# Patient Record
Sex: Male | Born: 1945 | Race: Black or African American | Hispanic: No | Marital: Married | Smoking: Never smoker
Health system: Southern US, Community
[De-identification: ages and names within clinical notes are randomized; demographics above are authoritative.]

## PROBLEM LIST (undated history)

## (undated) DIAGNOSIS — G4730 Sleep apnea, unspecified: Secondary | ICD-10-CM

## (undated) DIAGNOSIS — S8010XA Contusion of unspecified lower leg, initial encounter: Secondary | ICD-10-CM

## (undated) DIAGNOSIS — E785 Hyperlipidemia, unspecified: Secondary | ICD-10-CM

## (undated) DIAGNOSIS — Z9889 Other specified postprocedural states: Secondary | ICD-10-CM

## (undated) DIAGNOSIS — Z8719 Personal history of other diseases of the digestive system: Secondary | ICD-10-CM

## (undated) DIAGNOSIS — I219 Acute myocardial infarction, unspecified: Secondary | ICD-10-CM

## (undated) DIAGNOSIS — T7840XA Allergy, unspecified, initial encounter: Secondary | ICD-10-CM

## (undated) DIAGNOSIS — I2510 Atherosclerotic heart disease of native coronary artery without angina pectoris: Secondary | ICD-10-CM

## (undated) HISTORY — PX: CARDIAC CATHETERIZATION: SHX172

## (undated) HISTORY — PX: INGUINAL HERNIA REPAIR: SUR1180

## (undated) HISTORY — DX: Other specified postprocedural states: Z98.890

## (undated) HISTORY — DX: Allergy, unspecified, initial encounter: T78.40XA

## (undated) HISTORY — DX: Personal history of other diseases of the digestive system: Z87.19

## (undated) HISTORY — DX: Hyperlipidemia, unspecified: E78.5

## (undated) HISTORY — DX: Contusion of unspecified lower leg, initial encounter: S80.10XA

---

## 1998-11-12 ENCOUNTER — Ambulatory Visit (HOSPITAL_COMMUNITY): Admission: RE | Admit: 1998-11-12 | Discharge: 1998-11-12 | Payer: Self-pay | Admitting: Internal Medicine

## 1998-11-12 ENCOUNTER — Encounter: Payer: Self-pay | Admitting: Internal Medicine

## 2001-09-23 LAB — HM COLONOSCOPY: HM Colonoscopy: NORMAL

## 2004-10-10 ENCOUNTER — Ambulatory Visit: Payer: Self-pay | Admitting: Internal Medicine

## 2004-11-14 ENCOUNTER — Ambulatory Visit (HOSPITAL_COMMUNITY): Admission: RE | Admit: 2004-11-14 | Discharge: 2004-11-14 | Payer: Self-pay | Admitting: Internal Medicine

## 2004-11-14 ENCOUNTER — Ambulatory Visit: Payer: Self-pay | Admitting: Internal Medicine

## 2005-03-21 ENCOUNTER — Ambulatory Visit: Payer: Self-pay | Admitting: Internal Medicine

## 2005-03-22 ENCOUNTER — Ambulatory Visit: Payer: Self-pay | Admitting: Internal Medicine

## 2005-03-22 ENCOUNTER — Ambulatory Visit: Payer: Self-pay | Admitting: Gastroenterology

## 2005-03-24 ENCOUNTER — Ambulatory Visit (HOSPITAL_COMMUNITY): Admission: RE | Admit: 2005-03-24 | Discharge: 2005-03-24 | Payer: Self-pay | Admitting: Gastroenterology

## 2005-05-01 ENCOUNTER — Ambulatory Visit: Payer: Self-pay | Admitting: Gastroenterology

## 2005-06-29 ENCOUNTER — Ambulatory Visit: Payer: Self-pay | Admitting: Gastroenterology

## 2005-07-10 ENCOUNTER — Ambulatory Visit: Payer: Self-pay | Admitting: Internal Medicine

## 2006-01-10 ENCOUNTER — Ambulatory Visit: Payer: Self-pay | Admitting: Internal Medicine

## 2006-07-26 ENCOUNTER — Ambulatory Visit: Payer: Self-pay | Admitting: Endocrinology

## 2007-01-31 ENCOUNTER — Encounter: Payer: Self-pay | Admitting: *Deleted

## 2007-01-31 DIAGNOSIS — E785 Hyperlipidemia, unspecified: Secondary | ICD-10-CM | POA: Insufficient documentation

## 2007-03-20 ENCOUNTER — Ambulatory Visit: Payer: Self-pay | Admitting: Internal Medicine

## 2007-03-20 LAB — CONVERTED CEMR LAB
ALT: 32 units/L (ref 0–53)
AST: 26 units/L (ref 0–37)
Albumin: 3.9 g/dL (ref 3.5–5.2)
Alkaline Phosphatase: 49 units/L (ref 39–117)
BUN: 14 mg/dL (ref 6–23)
Basophils Absolute: 0.1 10*3/uL (ref 0.0–0.1)
Basophils Relative: 0.9 % (ref 0.0–1.0)
Bilirubin Urine: NEGATIVE
Bilirubin, Direct: 0.1 mg/dL (ref 0.0–0.3)
CO2: 29 meq/L (ref 19–32)
Calcium: 9.2 mg/dL (ref 8.4–10.5)
Chloride: 104 meq/L (ref 96–112)
Cholesterol: 184 mg/dL (ref 0–200)
Creatinine, Ser: 1 mg/dL (ref 0.4–1.5)
Crystals: NEGATIVE
Eosinophils Absolute: 0.1 10*3/uL (ref 0.0–0.6)
Eosinophils Relative: 2.2 % (ref 0.0–5.0)
GFR calc Af Amer: 98 mL/min
GFR calc non Af Amer: 81 mL/min
Glucose, Bld: 105 mg/dL — ABNORMAL HIGH (ref 70–99)
HCT: 45.1 % (ref 39.0–52.0)
HDL: 53.7 mg/dL (ref >39.0)
Hemoglobin, Urine: NEGATIVE
Hemoglobin: 15.7 g/dL (ref 13.0–17.0)
Ketones, ur: NEGATIVE mg/dL
LDL Cholesterol: 117 mg/dL — ABNORMAL HIGH (ref 0–99)
Lymphocytes Relative: 30.3 % (ref 12.0–46.0)
MCHC: 34.9 g/dL (ref 30.0–36.0)
MCV: 95 fL (ref 78.0–100.0)
Monocytes Absolute: 0.6 10*3/uL (ref 0.2–0.7)
Monocytes Relative: 11.3 % — ABNORMAL HIGH (ref 3.0–11.0)
Mucus, UA: NEGATIVE
Neutro Abs: 3.1 10*3/uL (ref 1.4–7.7)
Neutrophils Relative %: 55.3 % (ref 43.0–77.0)
Nitrite: NEGATIVE
PSA: 1.11 ng/mL (ref 0.10–4.00)
Platelets: 230 10*3/uL (ref 150–400)
Potassium: 4.6 meq/L (ref 3.5–5.1)
RBC / HPF: NONE SEEN
RBC: 4.75 M/uL (ref 4.22–5.81)
RDW: 11.9 % (ref 11.5–14.6)
Sodium: 139 meq/L (ref 135–145)
Specific Gravity, Urine: 1.02 (ref 1.000–1.03)
TSH: 1.7 microintl units/mL (ref 0.35–5.50)
Total Bilirubin: 0.8 mg/dL (ref 0.3–1.2)
Total CHOL/HDL Ratio: 3.4
Total Protein, Urine: NEGATIVE mg/dL
Total Protein: 6.6 g/dL (ref 6.0–8.3)
Triglycerides: 67 mg/dL (ref 0–149)
Urine Glucose: NEGATIVE mg/dL
Urobilinogen, UA: 0.2 (ref 0.0–1.0)
VLDL: 13 mg/dL (ref 0–40)
WBC: 5.6 10*3/uL (ref 4.5–10.5)
pH: 6.5 (ref 5.0–8.0)

## 2007-04-16 ENCOUNTER — Ambulatory Visit: Payer: Self-pay | Admitting: Internal Medicine

## 2007-04-16 DIAGNOSIS — N529 Male erectile dysfunction, unspecified: Secondary | ICD-10-CM | POA: Insufficient documentation

## 2007-04-16 DIAGNOSIS — K219 Gastro-esophageal reflux disease without esophagitis: Secondary | ICD-10-CM | POA: Insufficient documentation

## 2007-06-04 ENCOUNTER — Ambulatory Visit: Payer: Self-pay | Admitting: Internal Medicine

## 2007-06-04 DIAGNOSIS — R5381 Other malaise: Secondary | ICD-10-CM

## 2007-06-04 DIAGNOSIS — R5383 Other fatigue: Secondary | ICD-10-CM

## 2007-06-04 LAB — CONVERTED CEMR LAB
Cholesterol: 109 mg/dL (ref 0–200)
HDL: 42.3 mg/dL (ref >39.0)
LDL Cholesterol: 58 mg/dL (ref 0–99)
Testosterone: 530.85 ng/dL (ref 350.00–890)
Total CHOL/HDL Ratio: 2.6
Triglycerides: 42 mg/dL (ref 0–149)
VLDL: 8 mg/dL (ref 0–40)
Vitamin B-12: 341 pg/mL (ref 211–911)

## 2007-06-07 LAB — CONVERTED CEMR LAB: Vit D, 1,25-Dihydroxy: 54 (ref 30–89)

## 2007-10-16 ENCOUNTER — Ambulatory Visit: Payer: Self-pay | Admitting: Internal Medicine

## 2007-11-29 ENCOUNTER — Telehealth: Payer: Self-pay | Admitting: Internal Medicine

## 2008-02-12 ENCOUNTER — Telehealth: Payer: Self-pay | Admitting: Internal Medicine

## 2008-04-08 ENCOUNTER — Telehealth: Payer: Self-pay | Admitting: Internal Medicine

## 2008-04-09 ENCOUNTER — Ambulatory Visit: Payer: Self-pay | Admitting: Internal Medicine

## 2008-04-09 LAB — CONVERTED CEMR LAB
ALT: 19 units/L (ref 0–53)
AST: 23 units/L (ref 0–37)
Albumin: 3.8 g/dL (ref 3.5–5.2)
Alkaline Phosphatase: 38 units/L — ABNORMAL LOW (ref 39–117)
BUN: 13 mg/dL (ref 6–23)
Basophils Absolute: 0 10*3/uL (ref 0.0–0.1)
Basophils Relative: 0.3 % (ref 0.0–3.0)
Bilirubin Urine: NEGATIVE
Bilirubin, Direct: 0.1 mg/dL (ref 0.0–0.3)
CO2: 30 meq/L (ref 19–32)
Calcium: 9.1 mg/dL (ref 8.4–10.5)
Chloride: 106 meq/L (ref 96–112)
Cholesterol: 168 mg/dL (ref 0–200)
Creatinine, Ser: 1 mg/dL (ref 0.4–1.5)
Eosinophils Absolute: 0.3 10*3/uL (ref 0.0–0.7)
Eosinophils Relative: 5.6 % — ABNORMAL HIGH (ref 0.0–5.0)
GFR calc Af Amer: 98 mL/min
GFR calc non Af Amer: 81 mL/min
Glucose, Bld: 104 mg/dL — ABNORMAL HIGH (ref 70–99)
HCT: 44.2 % (ref 39.0–52.0)
HDL: 56.1 mg/dL (ref >39.0)
Hemoglobin: 14.8 g/dL (ref 13.0–17.0)
Ketones, ur: NEGATIVE mg/dL
LDL Cholesterol: 99 mg/dL (ref 0–99)
Leukocytes, UA: NEGATIVE
Lymphocytes Relative: 33.8 % (ref 12.0–46.0)
MCHC: 33.6 g/dL (ref 30.0–36.0)
MCV: 97.9 fL (ref 78.0–100.0)
Monocytes Absolute: 0.6 10*3/uL (ref 0.1–1.0)
Monocytes Relative: 11.5 % (ref 3.0–12.0)
Neutro Abs: 2.3 10*3/uL (ref 1.4–7.7)
Neutrophils Relative %: 48.8 % (ref 43.0–77.0)
Nitrite: NEGATIVE
Platelets: 198 10*3/uL (ref 150–400)
Potassium: 4.1 meq/L (ref 3.5–5.1)
RBC: 4.51 M/uL (ref 4.22–5.81)
RDW: 12.1 % (ref 11.5–14.6)
Sodium: 141 meq/L (ref 135–145)
Specific Gravity, Urine: 1.025 (ref 1.000–1.03)
TSH: 2.07 microintl units/mL (ref 0.35–5.50)
Total Bilirubin: 0.8 mg/dL (ref 0.3–1.2)
Total CHOL/HDL Ratio: 3
Total Protein, Urine: NEGATIVE mg/dL
Total Protein: 6.2 g/dL (ref 6.0–8.3)
Triglycerides: 63 mg/dL (ref 0–149)
Urine Glucose: NEGATIVE mg/dL
Urobilinogen, UA: 0.2 (ref 0.0–1.0)
VLDL: 13 mg/dL (ref 0–40)
WBC: 4.9 10*3/uL (ref 4.5–10.5)
pH: 5.5 (ref 5.0–8.0)

## 2008-04-16 ENCOUNTER — Ambulatory Visit: Payer: Self-pay | Admitting: Internal Medicine

## 2008-04-24 HISTORY — PX: CATARACT EXTRACTION W/ INTRAOCULAR LENS  IMPLANT, BILATERAL: SHX1307

## 2008-11-23 ENCOUNTER — Ambulatory Visit: Payer: Self-pay | Admitting: Internal Medicine

## 2008-11-23 DIAGNOSIS — L272 Dermatitis due to ingested food: Secondary | ICD-10-CM | POA: Insufficient documentation

## 2008-11-23 DIAGNOSIS — G4700 Insomnia, unspecified: Secondary | ICD-10-CM

## 2008-11-26 ENCOUNTER — Ambulatory Visit: Payer: Self-pay | Admitting: Internal Medicine

## 2008-11-26 LAB — CONVERTED CEMR LAB
BUN: 16 mg/dL (ref 6–23)
CO2: 29 meq/L (ref 19–32)
Chloride: 105 meq/L (ref 96–112)
Creatinine, Ser: 1.2 mg/dL (ref 0.4–1.5)
Eosinophils Absolute: 0.1 10*3/uL (ref 0.0–0.7)
Glucose, Bld: 84 mg/dL (ref 70–99)
HCT: 43.3 % (ref 39.0–52.0)
Lymphs Abs: 1.5 10*3/uL (ref 0.7–4.0)
MCHC: 34.8 g/dL (ref 30.0–36.0)
MCV: 97.4 fL (ref 78.0–100.0)
Monocytes Absolute: 0.5 10*3/uL (ref 0.1–1.0)
Neutrophils Relative %: 52.7 % (ref 43.0–77.0)
Platelets: 211 10*3/uL (ref 150.0–400.0)
Potassium: 4.2 meq/L (ref 3.5–5.1)
RDW: 12.3 % (ref 11.5–14.6)

## 2008-11-30 LAB — CONVERTED CEMR LAB: IgE (Immunoglobulin E), Serum: 63.3 intl units/mL (ref 0.0–180.0)

## 2009-01-27 ENCOUNTER — Emergency Department (HOSPITAL_COMMUNITY): Admission: EM | Admit: 2009-01-27 | Discharge: 2009-01-27 | Payer: Self-pay | Admitting: Emergency Medicine

## 2009-01-27 ENCOUNTER — Telehealth: Payer: Self-pay | Admitting: Internal Medicine

## 2009-03-22 ENCOUNTER — Ambulatory Visit: Payer: Self-pay | Admitting: Internal Medicine

## 2009-03-22 LAB — CONVERTED CEMR LAB
ALT: 26 units/L (ref 0–53)
AST: 24 units/L (ref 0–37)
Albumin: 3.7 g/dL (ref 3.5–5.2)
Bilirubin Urine: NEGATIVE
CO2: 28 meq/L (ref 19–32)
Calcium: 8.9 mg/dL (ref 8.4–10.5)
Creatinine, Ser: 0.9 mg/dL (ref 0.4–1.5)
Eosinophils Relative: 5 % (ref 0.0–5.0)
GFR calc non Af Amer: 90.6 mL/min (ref >60)
HCT: 43.3 % (ref 39.0–52.0)
HDL: 58.2 mg/dL (ref >39.00)
Hemoglobin: 14.7 g/dL (ref 13.0–17.0)
Ketones, ur: NEGATIVE mg/dL
Leukocytes, UA: NEGATIVE
Lymphs Abs: 2.3 10*3/uL (ref 0.7–4.0)
MCV: 99.2 fL (ref 78.0–100.0)
Monocytes Absolute: 0.6 10*3/uL (ref 0.1–1.0)
Monocytes Relative: 10.2 % (ref 3.0–12.0)
Neutro Abs: 2.3 10*3/uL (ref 1.4–7.7)
PSA: 1.08 ng/mL (ref 0.10–4.00)
Platelets: 192 10*3/uL (ref 150.0–400.0)
RDW: 12.6 % (ref 11.5–14.6)
Sodium: 140 meq/L (ref 135–145)
TSH: 2.24 microintl units/mL (ref 0.35–5.50)
Total CHOL/HDL Ratio: 3
Triglycerides: 82 mg/dL (ref 0.0–149.0)
Urine Glucose: NEGATIVE mg/dL
Urobilinogen, UA: 0.2 (ref 0.0–1.0)
WBC: 5.6 10*3/uL (ref 4.5–10.5)

## 2009-03-24 ENCOUNTER — Ambulatory Visit: Payer: Self-pay | Admitting: Internal Medicine

## 2009-03-29 ENCOUNTER — Telehealth: Payer: Self-pay | Admitting: Internal Medicine

## 2009-05-21 ENCOUNTER — Ambulatory Visit: Payer: Self-pay | Admitting: Internal Medicine

## 2009-09-28 ENCOUNTER — Ambulatory Visit: Payer: Self-pay | Admitting: Internal Medicine

## 2009-09-28 LAB — CONVERTED CEMR LAB: TSH: 1.24 microintl units/mL (ref 0.35–5.50)

## 2010-02-04 ENCOUNTER — Encounter: Payer: Self-pay | Admitting: Internal Medicine

## 2010-03-01 ENCOUNTER — Telehealth: Payer: Self-pay | Admitting: Internal Medicine

## 2010-05-22 LAB — CONVERTED CEMR LAB: PSA: 0.91 ng/mL (ref 0.10–4.00)

## 2010-05-23 ENCOUNTER — Ambulatory Visit
Admission: RE | Admit: 2010-05-23 | Discharge: 2010-05-23 | Payer: Self-pay | Source: Home / Self Care | Attending: Internal Medicine | Admitting: Internal Medicine

## 2010-05-23 ENCOUNTER — Encounter: Payer: Self-pay | Admitting: Internal Medicine

## 2010-05-23 ENCOUNTER — Other Ambulatory Visit: Payer: Self-pay | Admitting: Internal Medicine

## 2010-05-23 LAB — URINALYSIS
Bilirubin Urine: NEGATIVE
Ketones, ur: NEGATIVE
Total Protein, Urine: NEGATIVE
pH: 5.5 (ref 5.0–8.0)

## 2010-05-23 LAB — BASIC METABOLIC PANEL
BUN: 15 mg/dL (ref 6–23)
CO2: 27 mEq/L (ref 19–32)
Calcium: 8.7 mg/dL (ref 8.4–10.5)
Glucose, Bld: 92 mg/dL (ref 70–99)
Sodium: 137 mEq/L (ref 135–145)

## 2010-05-23 LAB — TSH: TSH: 1.58 u[IU]/mL (ref 0.35–5.50)

## 2010-05-23 LAB — CBC WITH DIFFERENTIAL/PLATELET
Basophils Absolute: 0 10*3/uL (ref 0.0–0.1)
Eosinophils Absolute: 0.1 10*3/uL (ref 0.0–0.7)
Hemoglobin: 14.9 g/dL (ref 13.0–17.0)
Lymphocytes Relative: 25.5 % (ref 12.0–46.0)
Lymphs Abs: 1.4 10*3/uL (ref 0.7–4.0)
MCHC: 34.1 g/dL (ref 30.0–36.0)
Neutro Abs: 3.5 10*3/uL (ref 1.4–7.7)
Platelets: 224 10*3/uL (ref 150.0–400.0)
RDW: 12.8 % (ref 11.5–14.6)

## 2010-05-23 LAB — HEPATIC FUNCTION PANEL
Albumin: 3.8 g/dL (ref 3.5–5.2)
Alkaline Phosphatase: 45 U/L (ref 39–117)

## 2010-05-23 LAB — LIPID PANEL
HDL: 52.8 mg/dL (ref >39.00)
Triglycerides: 44 mg/dL (ref 0.0–149.0)

## 2010-05-24 NOTE — Assessment & Plan Note (Signed)
Summary: 6 mos f/u #/cd   Vital Signs:  Patient profile:   65 year old male Height:      67 inches Weight:      164.38 pounds BMI:     25.84 O2 Sat:      97 % on Room air Temp:     97.7 degrees F oral Pulse rate:   61 / minute BP sitting:   114 / 80  (left arm) Cuff size:   regular  Vitals Entered By: Lucious Groves (September 28, 2009 9:24 AM)  O2 Flow:  Room air CC: 6 mo f/u./kb Is Patient Diabetic? No Pain Assessment Patient in pain? no        CC:  6 mo f/u./kb.  History of Present Illness: F/u hyperlipidemia and GERD. C/o lack of libido. He thinks his Testost. is low... Going to [place]  Current Medications (verified): 1)  Lovastatin 20 Mg Tabs (Lovastatin) .... Take 1 Tab By Mouth Daily 2)  Cialis 20 Mg Tabs (Tadalafil) .... Every 3 Days As Needed 3)  Epipen 2-Pak 0.3 Mg/0.33ml (1:1000) Devi (Epinephrine Hcl (Anaphylaxis)) .... As Dirr 4)  Zolpidem Tartrate 10 Mg  Tabs (Zolpidem Tartrate) .Marland Kitchen.. 1 By Mouth At [facility] Prn 5)  Vitamin D3 1000 Unit  Tabs (Cholecalciferol) .Marland Kitchen.. 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 Nasal mucosa are pink and moist without lesions or exudates. Mouth:  pharyngeal erythema and fair dentition.   Neck:  supple and cervical lymphadenopathy.   Lungs:  Normal respiratory  effort, chest expands symmetrically. Lungs are clear to auscultation, no crackles or wheezes. Heart:  Normal rate and regular rhythm. S1 and S2 normal without gallop, murmur, click, rub or other extra sounds. Abdomen:  Bowel sounds positive,abdomen soft and non-tender without masses, organomegaly or hernias noted. Msk:  No deformity or scoliosis noted of thoracic or lumbar spine.   Extremities:  No clubbing, cyanosis, edema, or deformity noted with normal full range of motion of all joints.   Neurologic:  No cranial nerve deficits noted. Station and gait are normal. Plantar reflexes are down-going bilaterally. DTRs are symmetrical throughout. Sensory, motor and coordinative functions appear intact. Skin:  Intact without suspicious lesions or rashes Psych:  Cognition and judgment appear intact. Alert and cooperative with normal attention span and concentration. No apparent delusions, illusions, hallucinations   Impression & Recommendations:  Problem # 1:  HYPERLIPIDEMIA (ICD-272.4) Assessment Improved  His updated medication list for this problem includes:    Lovastatin 20 Mg Tabs (Lovastatin) .Marland Kitchen... Take 1 tab by mouth daily  Problem # 2:  ALLERGY, FOOD (ICD-693.1) Assessment: Comment Only  Orders: T-Food Allergy Profile Specific IgE (86003/[id number])  Problem # 3:  GERD (ICD-530.81) Assessment: Comment Only  Problem # 4:  ERECTILE DYSFUNCTION (ZOX-096.04) Assessment: Unchanged Get labs His updated medication list for this problem includes:    Cialis 20 Mg Tabs (Tadalafil) ..... Every 3 days as needed  Complete Medication List: 1)  Lovastatin  20 Mg Tabs (Lovastatin) .... Take 1 tab by mouth daily 2)  Cialis 20 Mg Tabs (Tadalafil) .... Every 3 days as needed 3)  Epipen 2-pak 0.3 Mg/0.70ml (1:1000) Devi (Epinephrine hcl (anaphylaxis)) .... As dirr 4)  Zolpidem Tartrate 10 Mg Tabs (Zolpidem tartrate) .Marland Kitchen.. 1 by mouth at hs prn 5)  Vitamin D3 1000 Unit Tabs (Cholecalciferol) .Marland Kitchen.. 1  qd  Other Orders: TLB-Testosterone, Total (84403-TESTO) TLB-TSH (Thyroid Stimulating Hormone) (84443-TSH)  Patient Instructions: 1)  Please schedule a follow-up appointment in 6 months well w/labs. Prescriptions: EPIPEN 2-PAK 0.3 MG/0.3ML (1:1000) DEVI (EPINEPHRINE HCL (ANAPHYLAXIS)) as dirr  #1 x 2   Entered and Authorized by:   Tresa Garter MD   Signed by:   Tresa Garter MD on 09/28/2009   Method used:     RxID:   [id number] CIALIS 20 MG TABS (TADALAFIL) Every 3 days as needed  #4 x 11   Entered and Authorized by:   Tresa Garter MD   Signed by:   Tresa Garter MD on 09/28/2009   Method used:     RxID:   [id number]

## 2010-05-24 NOTE — Assessment & Plan Note (Signed)
Summary: ZOSTER-INJ-LB   Nurse Visit   Allergies: No Known Drug Allergies  Immunizations Administered:  Zostavax # 1:    Vaccine Type: Zostavax    Site: left deltoid    Mfr: Merck    Dose: 0.5 ml    Route: [place]    Given by: Lucious Groves    Exp. Date: 05/21/2010    Lot #: [id number]    VIS given: 02/03/05 given May 21, 2009.  Orders Added: 1)  Zoster (Shingles) Vaccine Live [90736] 2)  Admin 1st Vaccine [phone]

## 2010-05-24 NOTE — Progress Notes (Signed)
Summary: Colon due  Phone Note Call from Patient Call back at [phone]   Summary of Call: Patient is requesting a call back, has questions about colonoscopy.  Initial call taken by: Lamar Sprinkles, CMA,  March 01, 2010 2:22 PM  Follow-up for Phone Call        Pt wants to know when next colon is due. Chart ordered.  Follow-up by: Lamar Sprinkles, CMA,  March 01, 2010 5:23 PM  Additional Follow-up for Phone Call Additional follow up Details #1::        paper chart reviewed.  Last colon was 09/2001.  It was noted as normal. Pt advised next colon due 09/2011 per recall form. Additional Follow-up by: Lanier Prude, [facility]),  March 02, 2010 8:49 AM

## 2010-05-24 NOTE — Miscellaneous (Signed)
Summary: Flu 2011   Clinical Lists Changes  Observations: Added new observation of FLU VAX: Historical (02/02/2010 14:51)      Immunization History:  Influenza Immunization History:    Influenza:  historical (02/02/2010)

## 2010-06-01 NOTE — Assessment & Plan Note (Signed)
Summary: CPX /NWS  #   Vital Signs:  Patient profile:   65 year old male Height:      67 inches Weight:      167 pounds BMI:     26.25 Temp:     98.3 degrees F oral Pulse rate:   68 / minute Pulse rhythm:   regular Resp:     16 per minute BP sitting:   120 / 88  (left arm) Cuff size:   regular  Vitals Entered By: Lanier Prude, CMA(AAMA) (May 23, 2010 10:52 AM) CC: CPX Is Patient Diabetic? No   CC:  CPX.  History of Present Illness: The patient presents for a preventive health examination  C/o ED and lack of libido  Current Medications (verified): 1)  Lovastatin 20 Mg Tabs (Lovastatin) .... Take 1 Tab By Mouth Daily 2)  Cialis 20 Mg Tabs (Tadalafil) .... Every 3 Days As Needed 3)  Epipen 2-Pak 0.3 Mg/0.86ml (1:1000) Devi (Epinephrine Hcl (Anaphylaxis)) .... As Dirr 4)  Zolpidem Tartrate 10 Mg  Tabs (Zolpidem Tartrate) .Marland Kitchen.. 1 By Mouth At [facility] Prn 5)  Vitamin D3 1000 Unit  Tabs (Cholecalciferol) .Marland Kitchen.. 1 Qd 6)  Aspirin 81 Mg Tbec (Aspirin) .Marland Kitchen.. 1 By Mouth Once Daily  Allergies (verified): No Known Drug Allergies  Past History:  Past Medical History: Last updated: 11/23/2008 Hyperlipidemia GERD esophageal stricture s/p dilation Food allergies  Past Surgical History: Last updated: 01/31/2007 EGD (06/29/2005)  Family History: Last updated: 10/16/2007 Family History of CAD Male 1st degree relative <50 Uncle with prostate CA father with CABG at 87 yo  Family History: Reviewed history from 10/16/2007 and no changes required. 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         stressed out  Physical Exam  General:  Well-developed,well-nourished,in no acute distress; alert,appropriate and cooperative throughout examination Head:  Normocephalic and atraumatic without obvious abnormalities. No apparent alopecia or balding. Eyes:  No corneal or conjunctival inflammation noted. EOMI. Perrla. Ears:  External ear exam shows no significant lesions or deformities.  Otoscopic examination reveals clear canals, tympanic membranes are intact bilaterally without bulging, retraction, inflammation or discharge. Hearing is grossly normal bilaterally. Nose:  External nasal examination shows no deformity or inflammation. Nasal mucosa are pink and moist without lesions or exudates. Mouth:  Oral mucosa and oropharynx without lesions or exudates.  Teeth in good repair. Neck:  No deformities, masses, or tenderness noted. Lungs:  Normal respiratory effort, chest expands symmetrically. Lungs are clear to auscultation, no crackles or wheezes. Heart:  Normal rate and regular rhythm. S1 and S2 normal without gallop, murmur, click, rub or other extra sounds. Abdomen:  Bowel sounds positive,abdomen soft and non-tender without masses, organomegaly or hernias noted. Rectal:  No external abnormalities noted. Normal sphincter tone. No rectal masses or tenderness. G(-) Genitalia:  Testes bilaterally descended without nodularity, tenderness or masses. No scrotal masses or lesions. No penis lesions or urethral  discharge. Prostate:  Prostate gland firm and smooth, no enlargement, nodularity, tenderness, mass, asymmetry or induration. Msk:  No  deformity or scoliosis noted of thoracic or lumbar spine.   Extremities:  No clubbing, cyanosis, edema, or deformity noted with normal full range of motion of all joints.   Neurologic:  No cranial nerve deficits noted. Station and gait are normal. Plantar reflexes are down-going bilaterally. DTRs are symmetrical throughout. Sensory, motor and coordinative functions appear intact. Skin:  Intact without suspicious lesions or rashes Cervical Nodes:  No lymphadenopathy noted Psych:  Cognition and judgment appear intact. Alert and cooperative with normal attention span and concentration. No apparent delusions, illusions, hallucinations   Impression & Recommendations:  Problem # 1:  WELL ADULT EXAM (ICD-V70.0) Assessment New Health and age related issues were discussed. Available screening tests and vaccinations were discussed as well. Healthy life style including good diet and exercise was discussed.  The labs were reviewed with the patient.  Orders: EKG w/ Interpretation (93000)OK  Problem # 2:  STRESS DISORDER (ICD-V62.89) Assessment: Deteriorated Start Wellbutrin  Problem # 3:  INSOMNIA, PERSISTENT (ICD-307.42) Assessment: Comment Only Rx prn  Problem # 4:  ERECTILE DYSFUNCTION (ICD-607.84)/low libido Assessment: Unchanged As per #2 See "Patient Instructions".  His updated medication list for this problem includes:    Cialis 20 Mg Tabs (Tadalafil) ..... Every 3 days as needed  Complete Medication List: 1)  Lovastatin 20 Mg Tabs (Lovastatin) .... Take 1 tab by mouth daily 2)  Cialis 20 Mg Tabs (Tadalafil) .... Every 3 days as needed 3)  Epipen 2-pak 0.3 Mg/0.41ml (1:1000) Devi (Epinephrine hcl (anaphylaxis)) .... As dirr 4)  Zolpidem Tartrate 10 Mg Tabs (Zolpidem tartrate) .Marland Kitchen.. 1 by mouth at hs prn 5)  Vitamin D3 1000 Unit Tabs (Cholecalciferol) .Marland Kitchen.. 1 qd 6)  Aspirin 81 Mg Tbec (Aspirin) .Marland Kitchen.. 1 by mouth once daily 7)  Wellbutrin Sr 150 Mg Xr12h-tab (Bupropion hcl) .Marland Kitchen.. 1 by mouth q  am and q lunch (two times a day)  Patient Instructions: 1)  Can try L arginine (Arginmax) 2)  Please schedule a follow-up appointment in 1 year. Prescriptions: CIALIS 20 MG TABS (TADALAFIL) Every 3 days as needed  #4 x 11   Entered and Authorized by:   Tresa Garter MD   Signed by:   Tresa Garter MD on 05/23/2010   Method used:   Electronically to        [facility]  [phone]* (retail)       [address]       [place], [place]  [address]       Ph: [phone] or [phone]       Fax: [phone]   RxID:   [id number] WELLBUTRIN SR 150 MG XR12H-TAB (BUPROPION HCL) 1 by mouth q am and q lunch (two times a day)  #60 x 6   Entered and Authorized by:   Tresa Garter MD   Signed by:   Tresa Garter MD on 05/23/2010   Method used:   Electronically to        [facility]  [phone]* (retail)       [address]       [place], [place]  [address]       Ph: [phone] or [phone]       Fax: [phone]   RxID:   [id number]    Orders Added:  1)  EKG w/ Interpretation [93000] 2)  Est. Patient 40-64 years [phone]

## 2010-07-28 LAB — DIFFERENTIAL
Basophils Absolute: 0 10*3/uL (ref 0.0–0.1)
Eosinophils Relative: 2 % (ref 0–5)
Lymphocytes Relative: 26 % (ref 12–46)
Neutro Abs: 3 10*3/uL (ref 1.7–7.7)

## 2010-07-28 LAB — CBC
Platelets: 206 10*3/uL (ref 150–400)
RDW: 13.3 % (ref 11.5–15.5)

## 2010-09-09 NOTE — Assessment & Plan Note (Signed)
[facility]                             PRIMARY CARE OFFICE NOTE   Alan Wall, Alan Wall                        MRN:          [id number]  DATE:01/10/2006                            DOB:          1946-02-04    The patient is a 65 year old male who presents for a wellness examination.   Past medical history, family history and social history as per November 14, 2004  note.   ALLERGIES:  None.   MEDICATIONS:  1. Baby aspirin daily.  2. Omeprazole 40 mg daily.  3. Lovastatin 20 mg daily.   REVIEW OF SYSTEMS:  Negative for chest pain or shortness of breath.  No  syncope, no neurologic complaints, no myalgias, no emotional problems.  The  rest is negative.   PHYSICAL EXAMINATION:  Blood pressure 110/72, pulse 67, temp 97.7, weight  166 pounds.  He is in no acute distress, looks younger than his stated age.  HEENT:  Moist mucosa.  NECK:  Supple, no thyromegaly or bruits.  LUNGS:  Clear to auscultation and percussion, no wheeze or rales.  HEART:  S1, S2, no murmur, no gallop.  ABDOMEN:  Soft, nontender, no organomegaly, no masses felt.  LOWER EXTREMITIES:  Without edema.  He is alert, oriented and cooperative.  He denies being depressed.  RECTAL:  Reveals normal prostate, stool guaiac negative.  SKIN:  Clear.   Lab work is pending.  EKG today is normal.   ASSESSMENT AND PLAN:  1. Normal wellness examination.  Age/health-related issues discussed.      Healthy lifestyle discussed.  2. Dyslipidemia.  Continue with baby aspirin and lovastatin.  3. Travel to [place] discussed.  Will give him hepatitis A vaccine, a      tetanus shot.  I gave him Malarone for malaria prophylaxis, Cipro 500      mg p.o. a day p.r.n., Lomotil and Phenergan      prescriptions to have with him during travel.  He will also contact the      [facility] Department for recommendations on additional vaccinations.            ______________________________  Georgina Quint Plotnikov,  MD      AVP/MedQ  DD:  01/18/2006  DT:  01/20/2006  Job #:  [id number]

## 2010-11-03 ENCOUNTER — Telehealth: Payer: Self-pay | Admitting: *Deleted

## 2010-11-03 NOTE — Telephone Encounter (Signed)
Patient needs to schedule an OV w/Dr Plotnikov [needing referral to Sports Medicine for Pain in neck, back, and shoulder]

## 2010-12-08 NOTE — Telephone Encounter (Signed)
APPT AUG 21.

## 2010-12-13 ENCOUNTER — Ambulatory Visit (INDEPENDENT_AMBULATORY_CARE_PROVIDER_SITE_OTHER): Payer: BC Managed Care – PPO | Admitting: Internal Medicine

## 2010-12-13 ENCOUNTER — Encounter: Payer: Self-pay | Admitting: Internal Medicine

## 2010-12-13 DIAGNOSIS — R42 Dizziness and giddiness: Secondary | ICD-10-CM

## 2010-12-13 DIAGNOSIS — Z23 Encounter for immunization: Secondary | ICD-10-CM

## 2010-12-13 NOTE — Assessment & Plan Note (Signed)
Meclizine Rx Benign Positional Vertigo symptoms on the left. Start Meclizine. Start Francee Piccolo - Dar[employer] exercise several times a day as dirrected.

## 2010-12-13 NOTE — Progress Notes (Signed)
  Subjective:    Patient ID: RIAAN TOLEDO, male    DOB: [birth year], 65 y.o.   MRN: [id number]  HPI   C/o an episode of dizziness on 8/16 lasting for a few min and then resolved. He went to ER - EKG was OK; he was given meclizine.  Review of Systems  Constitutional: Negative for appetite change, fatigue and unexpected weight change.  HENT: Negative for nosebleeds, congestion, sore throat, sneezing, trouble swallowing and neck pain.   Eyes: Negative for itching and visual disturbance.  Respiratory: Negative for cough.   Cardiovascular: Negative for chest pain, palpitations and leg swelling.  Gastrointestinal: Negative for nausea, diarrhea, blood in stool and abdominal distention.  Genitourinary: Negative for frequency and hematuria.  Musculoskeletal: Negative for back pain, joint swelling and gait problem.  Skin: Negative for rash.  Neurological: Negative for dizziness, tremors, speech difficulty and weakness.  Psychiatric/Behavioral: Negative for sleep disturbance, dysphoric mood and agitation. The patient is not nervous/anxious.        Objective:   Physical Exam  Constitutional: He is oriented to person, place, and time. He appears well-developed.  HENT:  Mouth/Throat: Oropharynx is clear and moist.  Eyes: Conjunctivae are normal. Pupils are equal, round, and reactive to light.  Neck: Normal range of motion. No JVD present. No thyromegaly present.  Cardiovascular: Normal rate, regular rhythm, normal heart sounds and intact distal pulses.  Exam reveals no gallop and no friction rub.   No murmur heard. Pulmonary/Chest: Effort normal and breath sounds normal. No respiratory distress. He has no wheezes. He has no rales. He exhibits no tenderness.  Abdominal: Soft. Bowel sounds are normal. He exhibits no distension and no mass. There is no tenderness. There is no rebound and no guarding.  Musculoskeletal: Normal range of motion. He exhibits no edema and no tenderness.  Lymphadenopathy:     He has no cervical adenopathy.  Neurological: He is alert and oriented to person, place, and time. He has normal reflexes. No cranial nerve deficit. He exhibits normal muscle tone. Coordination normal.  Skin: Skin is warm and dry. No rash noted.  Psychiatric: He has a normal mood and affect. His behavior is normal. Judgment and thought content normal.     H-P is (+) on L a little     Assessment & Plan:

## 2010-12-22 ENCOUNTER — Telehealth: Payer: Self-pay | Admitting: *Deleted

## 2010-12-22 MED ORDER — BUPROPION HCL ER (SR) 150 MG PO TB12: 150.0000 mg | ORAL_TABLET | Freq: Two times a day (BID) | ORAL | Status: DC

## 2010-12-22 NOTE — Telephone Encounter (Signed)
Pt informed

## 2010-12-22 NOTE — Telephone Encounter (Signed)
Patient requesting RF of welbutrin, says RX was supposed to be sent in at last OV. I sent in RX to [facility] in [place], he is req a call when complete.

## 2011-01-02 ENCOUNTER — Encounter: Payer: Self-pay | Admitting: Internal Medicine

## 2011-01-16 ENCOUNTER — Emergency Department (HOSPITAL_COMMUNITY)
Admission: EM | Admit: 2011-01-16 | Discharge: 2011-01-16 | Disposition: A | Discharge status: Home / Self Care | Payer: BC Managed Care – PPO | Attending: Emergency Medicine | Admitting: Emergency Medicine

## 2011-01-16 ENCOUNTER — Emergency Department (HOSPITAL_COMMUNITY): Payer: BC Managed Care – PPO

## 2011-01-16 DIAGNOSIS — E7800 Pure hypercholesterolemia, unspecified: Secondary | ICD-10-CM | POA: Insufficient documentation

## 2011-01-16 DIAGNOSIS — S0990XA Unspecified injury of head, initial encounter: Secondary | ICD-10-CM | POA: Insufficient documentation

## 2011-01-16 DIAGNOSIS — R404 Transient alteration of awareness: Secondary | ICD-10-CM | POA: Insufficient documentation

## 2011-01-16 DIAGNOSIS — R51 Headache: Secondary | ICD-10-CM | POA: Insufficient documentation

## 2011-01-16 DIAGNOSIS — M25559 Pain in unspecified hip: Secondary | ICD-10-CM | POA: Insufficient documentation

## 2011-01-16 DIAGNOSIS — Z79899 Other long term (current) drug therapy: Secondary | ICD-10-CM | POA: Insufficient documentation

## 2011-01-16 DIAGNOSIS — S81009A Unspecified open wound, unspecified knee, initial encounter: Secondary | ICD-10-CM | POA: Insufficient documentation

## 2011-01-16 DIAGNOSIS — M25569 Pain in unspecified knee: Secondary | ICD-10-CM | POA: Insufficient documentation

## 2011-01-16 DIAGNOSIS — IMO0002 Reserved for concepts with insufficient information to code with codable children: Secondary | ICD-10-CM | POA: Insufficient documentation

## 2011-01-23 ENCOUNTER — Encounter: Payer: Self-pay | Admitting: Internal Medicine

## 2011-01-23 ENCOUNTER — Ambulatory Visit (INDEPENDENT_AMBULATORY_CARE_PROVIDER_SITE_OTHER): Payer: BC Managed Care – PPO | Admitting: Internal Medicine

## 2011-01-23 VITALS — BP 128/98 | HR 68 | Temp 98.2°F | Resp 16 | Wt 159.0 lb

## 2011-01-23 DIAGNOSIS — R109 Unspecified abdominal pain: Secondary | ICD-10-CM

## 2011-01-23 DIAGNOSIS — S060X9A Concussion with loss of consciousness of unspecified duration, initial encounter: Secondary | ICD-10-CM

## 2011-01-23 DIAGNOSIS — M25569 Pain in unspecified knee: Secondary | ICD-10-CM

## 2011-01-23 DIAGNOSIS — T148XXA Other injury of unspecified body region, initial encounter: Secondary | ICD-10-CM

## 2011-01-23 DIAGNOSIS — W1809XA Striking against other object with subsequent fall, initial encounter: Secondary | ICD-10-CM

## 2011-01-23 DIAGNOSIS — W1800XA Striking against unspecified object with subsequent fall, initial encounter: Secondary | ICD-10-CM

## 2011-01-23 MED ORDER — IBUPROFEN 600 MG PO TABS: ORAL_TABLET | ORAL | Status: AC

## 2011-01-23 NOTE — Patient Instructions (Signed)
Ice 24 h
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
## 2011-01-25 ENCOUNTER — Telehealth: Payer: Self-pay | Admitting: *Deleted

## 2011-01-25 ENCOUNTER — Encounter: Payer: Self-pay | Admitting: Internal Medicine

## 2011-01-25 DIAGNOSIS — S060XAA Concussion with loss of consciousness status unknown, initial encounter: Secondary | ICD-10-CM | POA: Insufficient documentation

## 2011-01-25 DIAGNOSIS — T148XXA Other injury of unspecified body region, initial encounter: Secondary | ICD-10-CM | POA: Insufficient documentation

## 2011-01-25 DIAGNOSIS — W1800XA Striking against unspecified object with subsequent fall, initial encounter: Secondary | ICD-10-CM | POA: Insufficient documentation

## 2011-01-25 DIAGNOSIS — R109 Unspecified abdominal pain: Secondary | ICD-10-CM | POA: Insufficient documentation

## 2011-01-25 DIAGNOSIS — M25569 Pain in unspecified knee: Secondary | ICD-10-CM | POA: Insufficient documentation

## 2011-01-25 DIAGNOSIS — R10A0 Flank pain, unspecified side: Secondary | ICD-10-CM | POA: Insufficient documentation

## 2011-01-25 NOTE — Assessment & Plan Note (Signed)
Rest

## 2011-01-25 NOTE — Assessment & Plan Note (Signed)
L flank post-fall degloving injury. See procedure Morel-Lavallee lesion - large

## 2011-01-25 NOTE — Assessment & Plan Note (Signed)
Brace NSAIDs prn

## 2011-01-25 NOTE — Telephone Encounter (Signed)
Pt states he is not as sore as he was but still sore. He states the fluid on his hip has returned. He states he feels it will need to be drained again. Does he need OV sooner than Monday 01-30-11?

## 2011-01-25 NOTE — Telephone Encounter (Signed)
I could see him tomorrow pm - last pt. Cont Ibuprofen. Thx Thx

## 2011-01-25 NOTE — Telephone Encounter (Signed)
Left pt detailed VM and put him on schedule for tomorrow at 1:30

## 2011-01-25 NOTE — Telephone Encounter (Signed)
Message copied by Merrilyn Puma on Wed Jan 25, 2011  2:00 PM ------      Message from: Janeal Holmes      Created: Wed Jan 25, 2011  7:54 AM       Misty Stanley, please, call - is he better?      Thx

## 2011-01-25 NOTE — Assessment & Plan Note (Addendum)
9/12 L flank Morel-Lavallee lesion - large  [email or web address]/clinic/1006   He may need a surgical trauma consult for a sclerosing procedure if fluid returns

## 2011-01-26 ENCOUNTER — Ambulatory Visit (INDEPENDENT_AMBULATORY_CARE_PROVIDER_SITE_OTHER): Payer: BC Managed Care – PPO | Admitting: Internal Medicine

## 2011-01-26 ENCOUNTER — Encounter: Payer: Self-pay | Admitting: Internal Medicine

## 2011-01-26 VITALS — BP 120/84 | HR 64 | Temp 98.2°F | Wt 159.0 lb

## 2011-01-26 DIAGNOSIS — S51809A Unspecified open wound of unspecified forearm, initial encounter: Secondary | ICD-10-CM | POA: Insufficient documentation

## 2011-01-26 DIAGNOSIS — R109 Unspecified abdominal pain: Secondary | ICD-10-CM

## 2011-01-26 DIAGNOSIS — R1909 Other intra-abdominal and pelvic swelling, mass and lump: Secondary | ICD-10-CM

## 2011-01-26 DIAGNOSIS — T148XXA Other injury of unspecified body region, initial encounter: Secondary | ICD-10-CM

## 2011-01-26 NOTE — Progress Notes (Signed)
  Subjective:    Patient ID: Alan Wall, male    DOB: 1945-07-16, 65 y.o.   MRN: [id number]  HPI  F/u L flank swelling - re accumulated to the same size, NT C/o L forearm wound - too dry... F/u R knee pain - a little better   Review of Systems  Constitutional: Negative for appetite change, fatigue and unexpected weight change.  HENT: Negative for nosebleeds, congestion, sore throat, sneezing, trouble swallowing and neck pain.   Eyes: Negative for itching and visual disturbance.  Respiratory: Negative for cough.   Cardiovascular: Negative for chest pain, palpitations and leg swelling.  Gastrointestinal: Negative for nausea, abdominal pain, diarrhea, blood in stool and abdominal distention.  Genitourinary: Negative for dysuria, frequency, hematuria, flank pain, decreased urine volume and difficulty urinating.  Musculoskeletal: Negative for back pain, joint swelling and gait problem.  Skin: Negative for rash.  Neurological: Negative for dizziness, tremors, speech difficulty and weakness.  Psychiatric/Behavioral: Negative for sleep disturbance, dysphoric mood and agitation. The patient is not nervous/anxious.        Objective:   Physical Exam  Constitutional: He is oriented to person, place, and time. He appears well-developed.  HENT:  Mouth/Throat: Oropharynx is clear and moist.  Eyes: Conjunctivae are normal. Pupils are equal, round, and reactive to light.  Neck: Normal range of motion. No JVD present. No thyromegaly present.  Cardiovascular: Normal rate, regular rhythm, normal heart sounds and intact distal pulses.  Exam reveals no gallop and no friction rub.   No murmur heard. Pulmonary/Chest: Effort normal and breath sounds normal. No respiratory distress. He has no wheezes. He has no rales. He exhibits no tenderness.  Abdominal: Soft. Bowel sounds are normal. He exhibits no distension and no mass. There is no tenderness. There is no rebound and no guarding.  Musculoskeletal:  Normal range of motion. He exhibits no edema and no tenderness.       L flank swelling has re-accumulated, NT, no heat  Lymphadenopathy:    He has no cervical adenopathy.  Neurological: He is alert and oriented to person, place, and time. He has normal reflexes. No cranial nerve deficit. He exhibits normal muscle tone. Coordination normal.  Skin: Skin is warm and dry. No rash noted.       L post forearm granulating abrasion L shin laceration is clean L hip is bruised  Psychiatric: He has a normal mood and affect. His behavior is normal. Judgment and thought content normal.   Procedure Note :  Procedure  : Point of care (POC) f/u sonography examination  Indication: L flank above L buttock "jiggly"swelling about 12x12 cm, posttraumatic - the swelling has returned Equipment used: Sonosite M-Turbo with HFL38x/13-6 MHz transducer linear probe. The images were stored in the unit and later transferred in storage.  The patient was placed in a standing position.  This study revealed a hypoechoic large fluid collection in the L flank above the gluteus (postero-lateral flank area) surrounded by heteroechoic infiltrate Impression: L flank large fluid collection, posttraumatic about 2 cm deep, relapsed.        Assessment & Plan:
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
## 2011-01-26 NOTE — Assessment & Plan Note (Signed)
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 Fluid reoccurred in 1 day. Rib belt was applied for pressure. Surg cons tomorrow  [email or web address]

## 2011-01-26 NOTE — Assessment & Plan Note (Signed)
Granulating L post forearm Abx oint, TELFA, Coban

## 2011-01-26 NOTE — Patient Instructions (Signed)
[email or web address]

## 2011-01-30 ENCOUNTER — Encounter: Payer: Self-pay | Admitting: Internal Medicine

## 2011-01-30 ENCOUNTER — Ambulatory Visit (INDEPENDENT_AMBULATORY_CARE_PROVIDER_SITE_OTHER): Payer: BC Managed Care – PPO | Admitting: Internal Medicine

## 2011-01-30 DIAGNOSIS — T148XXA Other injury of unspecified body region, initial encounter: Secondary | ICD-10-CM

## 2011-01-30 DIAGNOSIS — M25569 Pain in unspecified knee: Secondary | ICD-10-CM

## 2011-01-30 DIAGNOSIS — S91009A Unspecified open wound, unspecified ankle, initial encounter: Secondary | ICD-10-CM

## 2011-01-30 DIAGNOSIS — S81809A Unspecified open wound, unspecified lower leg, initial encounter: Secondary | ICD-10-CM

## 2011-01-30 NOTE — Assessment & Plan Note (Signed)
The swelling is better Cont w/NSAID and a rib belt Surg appt on Fri

## 2011-01-30 NOTE — Assessment & Plan Note (Signed)
Sutures removed.

## 2011-01-30 NOTE — Assessment & Plan Note (Signed)
Better  

## 2011-01-30 NOTE — Progress Notes (Signed)
  Subjective:    Patient ID: Alan Wall, male    DOB: [birth year], 65 y.o.   MRN: [id number]  HPI F/u L flank swelling and R knee pain. We need to remove sutures   Review of Systems  Constitutional: Negative for fever.  Gastrointestinal: Negative for abdominal pain.  Hematological: Does not bruise/bleed easily.  Psychiatric/Behavioral: The patient is not nervous/anxious.        Objective:   Physical Exam  Constitutional: He appears well-developed. No distress.  Eyes: No scleral icterus.  Abdominal: He exhibits no distension. There is no tenderness.  Musculoskeletal: He exhibits edema and tenderness.       L flank swelling is better R knee swelling/pain is better  Skin:       Wound healed Sutures removed          Assessment & Plan:

## 2011-02-03 ENCOUNTER — Ambulatory Visit (INDEPENDENT_AMBULATORY_CARE_PROVIDER_SITE_OTHER): Payer: BC Managed Care – PPO | Admitting: Surgery

## 2011-02-03 ENCOUNTER — Encounter (INDEPENDENT_AMBULATORY_CARE_PROVIDER_SITE_OTHER): Payer: Self-pay | Admitting: Surgery

## 2011-02-03 VITALS — BP 138/86 | HR 60 | Temp 97.5°F | Resp 20 | Ht 68.0 in | Wt 157.5 lb

## 2011-02-03 DIAGNOSIS — S8001XA Contusion of right knee, initial encounter: Secondary | ICD-10-CM

## 2011-02-03 DIAGNOSIS — S7000XA Contusion of unspecified hip, initial encounter: Secondary | ICD-10-CM

## 2011-02-03 DIAGNOSIS — S7002XA Contusion of left hip, initial encounter: Secondary | ICD-10-CM

## 2011-02-03 DIAGNOSIS — S8000XA Contusion of unspecified knee, initial encounter: Secondary | ICD-10-CM
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
## 2011-03-06 ENCOUNTER — Encounter (INDEPENDENT_AMBULATORY_CARE_PROVIDER_SITE_OTHER): Payer: Self-pay | Admitting: Surgery

## 2011-03-08 ENCOUNTER — Encounter: Payer: Self-pay | Admitting: Internal Medicine

## 2011-03-08 ENCOUNTER — Ambulatory Visit (INDEPENDENT_AMBULATORY_CARE_PROVIDER_SITE_OTHER): Payer: BC Managed Care – PPO | Admitting: Internal Medicine

## 2011-03-08 VITALS — BP 144/70 | HR 60 | Temp 97.7°F | Ht 67.5 in | Wt 160.0 lb

## 2011-03-08 DIAGNOSIS — R42 Dizziness and giddiness: Secondary | ICD-10-CM

## 2011-03-08 DIAGNOSIS — G4700 Insomnia, unspecified: Secondary | ICD-10-CM

## 2011-03-08 DIAGNOSIS — T148XXA Other injury of unspecified body region, initial encounter: Secondary | ICD-10-CM

## 2011-03-08 DIAGNOSIS — M25569 Pain in unspecified knee: Secondary | ICD-10-CM

## 2011-03-08 MED ORDER — TADALAFIL 20 MG PO TABS: 20.0000 mg | ORAL_TABLET | ORAL | Status: DC | PRN

## 2011-03-08 MED ORDER — MELOXICAM 15 MG PO TABS: 15.0000 mg | ORAL_TABLET | Freq: Every day | ORAL | Status: DC | PRN

## 2011-03-08 MED ORDER — BUPROPION HCL ER (XL) 150 MG PO TB24: 150.0000 mg | ORAL_TABLET | ORAL | Status: DC

## 2011-03-08 MED ORDER — ZOLPIDEM TARTRATE 10 MG PO TABS: 10.0000 mg | ORAL_TABLET | Freq: Every evening | ORAL | Status: DC | PRN

## 2011-03-08 NOTE — Assessment & Plan Note (Signed)
Effusion is better.  Ortho consult Restart NSAID

## 2011-03-08 NOTE — Assessment & Plan Note (Signed)
Improving.

## 2011-03-08 NOTE — Assessment & Plan Note (Signed)
Continue with current prescription therapy as reflected on the Med list.  

## 2011-03-08 NOTE — Progress Notes (Signed)
  Subjective:    Patient ID: Alan Wall, male    DOB: 1946/02/22, 65 y.o.   MRN: [id number]  HPI  C/o R knee decreased ROM and pain F/u dizziness F/u L flank pain F/u insomnia, ED  Review of Systems  Constitutional: Negative for appetite change, fatigue and unexpected weight change.  HENT: Negative for nosebleeds, congestion, sore throat, sneezing, trouble swallowing and neck pain.   Eyes: Negative for itching and visual disturbance.  Respiratory: Negative for cough.   Cardiovascular: Negative for chest pain, palpitations and leg swelling.  Gastrointestinal: Negative for nausea, diarrhea, blood in stool and abdominal distention.  Genitourinary: Negative for frequency and hematuria.  Musculoskeletal: Positive for joint swelling (L knee) and gait problem. Negative for back pain.  Skin: Negative for rash.  Neurological: Negative for dizziness, tremors, speech difficulty and weakness.  Psychiatric/Behavioral: Negative for sleep disturbance, dysphoric mood and agitation. The patient is not nervous/anxious.        Objective:   Physical Exam  Constitutional: He is oriented to person, place, and time. He appears well-developed.  HENT:  Mouth/Throat: Oropharynx is clear and moist.  Eyes: Conjunctivae are normal. Pupils are equal, round, and reactive to light.  Neck: Normal range of motion. No JVD present. No thyromegaly present.  Cardiovascular: Normal rate, regular rhythm, normal heart sounds and intact distal pulses.  Exam reveals no gallop and no friction rub.   No murmur heard. Pulmonary/Chest: Effort normal and breath sounds normal. No respiratory distress. He has no wheezes. He has no rales. He exhibits no tenderness.  Abdominal: Soft. Bowel sounds are normal. He exhibits no distension and no mass. There is no tenderness. There is no rebound and no guarding.  Musculoskeletal: Normal range of motion. He exhibits edema (R knee is swollen; L flank). He exhibits no tenderness.    Lymphadenopathy:    He has no cervical adenopathy.  Neurological: He is alert and oriented to person, place, and time. He has normal reflexes. No cranial nerve deficit. He exhibits normal muscle tone. Coordination normal.  Skin: Skin is warm and dry. No rash noted.  Psychiatric: He has a normal mood and affect. His behavior is normal. Judgment and thought content normal.  R knee flexion is restricted Wt Readings from Last 3 Encounters:  03/08/11 160 lb (72.576 kg)  02/03/11 157 lb 8 oz (71.442 kg)  01/30/11 156 lb (70.761 kg)          Assessment & Plan:

## 2011-03-08 NOTE — Assessment & Plan Note (Signed)
Better  

## 2011-03-13 ENCOUNTER — Encounter (INDEPENDENT_AMBULATORY_CARE_PROVIDER_SITE_OTHER): Payer: BC Managed Care – PPO | Admitting: Surgery

## 2011-07-27 ENCOUNTER — Encounter (HOSPITAL_COMMUNITY): Payer: Self-pay | Admitting: Emergency Medicine

## 2011-07-27 ENCOUNTER — Emergency Department (INDEPENDENT_AMBULATORY_CARE_PROVIDER_SITE_OTHER)
Admission: EM | Admit: 2011-07-27 | Discharge: 2011-07-27 | Disposition: A | Discharge status: Home / Self Care | Payer: BC Managed Care – PPO | Source: Home / Self Care | Attending: Family Medicine | Admitting: Family Medicine

## 2011-07-27 ENCOUNTER — Encounter: Payer: Self-pay | Admitting: Gastroenterology

## 2011-07-27 DIAGNOSIS — J069 Acute upper respiratory infection, unspecified: Secondary | ICD-10-CM

## 2011-07-27 MED ORDER — AZITHROMYCIN 250 MG PO TABS: 250.0000 mg | ORAL_TABLET | Freq: Every day | ORAL | Status: AC

## 2011-07-27 NOTE — ED Notes (Signed)
Repositioned bed and provided ice water

## 2011-07-27 NOTE — ED Notes (Signed)
Congestion and coughing.  Onset 5 days ago.  Denies fever. Reports clearing of throat, but not coughing phlegm out.

## 2011-07-27 NOTE — Discharge Instructions (Signed)
mucinex d recommended . Avoid caffeine and milk products. vick's sinex pump spray before boarding . Ear ease plugs for ears. Chew gum.

## 2011-07-27 NOTE — ED Provider Notes (Signed)
History     CSN: [id number]  Arrival date & time 07/27/11  0902   First MD Initiated Contact with Patient 07/27/11 1003      Chief Complaint  Patient presents with  . URI    (Consider location/radiation/quality/duration/timing/severity/associated sxs/prior treatment) HPI Comments: Cough and congestion x 5 days. No fever, no sore throat. Minimal facial pressure. Cough occ productive. Taking otc with minimal relief. Boarding a plane in a few days. Concerned for his ears and pressure changes.   The history is provided by the patient.    Past Medical History  Diagnosis Date  . Hyperlipidemia   . GERD (gastroesophageal reflux disease)   . Allergy     Food  . S/P dilatation of esophageal stricture   . Hematoma of leg     History reviewed. No pertinent past surgical history.  Family History  Problem Relation Age of Onset  . Heart disease Father     CABG at 67 yo  . Prostate cancer Paternal Uncle   . Coronary artery disease Other     History  Substance Use Topics  . Smoking status: Never Smoker   . Smokeless tobacco: Not on file  . Alcohol Use: 1.2 oz/week    2 Glasses of wine per week      Review of Systems  Constitutional: Negative.   HENT: Positive for congestion and postnasal drip. Negative for sore throat, sneezing and sinus pressure.   Respiratory: Positive for cough. Negative for chest tightness and wheezing.   Cardiovascular: Negative.   Gastrointestinal: Negative.   Genitourinary: Negative.   Skin: Negative.     Allergies  Review of patient's allergies indicates no known allergies.  Home Medications   Current Outpatient Rx  Name Route Sig Dispense Refill  . BUPROPION HCL ER (XL) 150 MG PO TB24 Oral Take 1 tablet (150 mg total) by mouth every morning. 30 tablet 11  . ASPIRIN 81 MG PO TABS Oral Take 81 mg by mouth every other day.     . AZITHROMYCIN 250 MG PO TABS Oral Take 1 tablet (250 mg total) by mouth daily. Take first 2 tablets together, then 1  every day until finished. 6 tablet 0  . CHOLECALCIFEROL 1000 UNITS PO TABS Oral Take 1,000 Units by mouth daily.      Marland Kitchen EPINEPHRINE 0.3 MG/0.3ML IJ DEVI Intramuscular Inject 0.3 mg into the muscle as directed.      . MECLIZINE HCL 25 MG PO TABS      . MELOXICAM 15 MG PO TABS Oral Take 1 tablet (15 mg total) by mouth daily as needed for pain. 30 tablet 3  . TADALAFIL 20 MG PO TABS Oral Take 1 tablet (20 mg total) by mouth as needed. 10 tablet 11  . ZOLPIDEM TARTRATE 10 MG PO TABS Oral Take 1 tablet (10 mg total) by mouth at bedtime as needed. 30 tablet 3    BP 107/78  Pulse 66  Temp(Src) 98.3 F (36.8 C) (Oral)  Resp 14  SpO2 97%  Physical Exam  Nursing note and vitals reviewed. Constitutional: He appears well-developed and well-nourished. He appears distressed.  HENT:  Head: Normocephalic.       Ears clear, nose congested, throat mild erythema with post nasal drip  Neck: Normal range of motion. Neck supple.  Cardiovascular: Normal rate and regular rhythm.   Pulmonary/Chest: Effort normal. He has no wheezes.  Lymphadenopathy:    He has no cervical adenopathy.  Skin: Skin is warm and dry.  ED Course  Procedures (including critical care time)  Labs Reviewed - No data to display No results found.   1. URI (upper respiratory infection)       MDM          Randa Spike, MD 07/27/11 [phone]

## 2012-02-08 ENCOUNTER — Encounter: Payer: Self-pay | Admitting: Gastroenterology

## 2012-02-13 ENCOUNTER — Encounter: Payer: Self-pay | Admitting: Internal Medicine

## 2012-02-13 ENCOUNTER — Ambulatory Visit (INDEPENDENT_AMBULATORY_CARE_PROVIDER_SITE_OTHER): Payer: Medicare Other | Admitting: Internal Medicine

## 2012-02-13 VITALS — BP 104/80 | HR 80 | Temp 97.6°F | Resp 16 | Wt 158.0 lb

## 2012-02-13 DIAGNOSIS — Z733 Stress, not elsewhere classified: Secondary | ICD-10-CM

## 2012-02-13 DIAGNOSIS — L290 Pruritus ani: Secondary | ICD-10-CM

## 2012-02-13 DIAGNOSIS — F439 Reaction to severe stress, unspecified: Secondary | ICD-10-CM

## 2012-02-13 MED ORDER — CLOTRIMAZOLE-BETAMETHASONE 1-0.05 % EX CREA: TOPICAL_CREAM | Freq: Two times a day (BID) | CUTANEOUS | Status: DC

## 2012-02-13 NOTE — Assessment & Plan Note (Signed)
Coping ok 

## 2012-02-13 NOTE — Progress Notes (Signed)
Patient ID: Alan Wall, male   DOB: 02/08/1946, 66 y.o.   MRN: [id number]  Subjective:    Patient ID: Alan Wall, male    DOB: September 26, 1945, 66 y.o.   MRN: [id number]  HPI  C/o rash and itching in the  X 1 mo, stress related  F/u insomnia, ED  Review of Systems  Constitutional: Negative for appetite change, fatigue and unexpected weight change.  HENT: Negative for nosebleeds, congestion, sore throat, sneezing, trouble swallowing and neck pain.   Eyes: Negative for itching and visual disturbance.  Respiratory: Negative for cough.   Cardiovascular: Negative for chest pain, palpitations and leg swelling.  Gastrointestinal: Negative for nausea, diarrhea, blood in stool and abdominal distention.  Genitourinary: Negative for frequency and hematuria.  Musculoskeletal:  Negative for back pain.  Skin: Pos for rash.  Neurological: Negative for dizziness, tremors, speech difficulty and weakness.  Psychiatric/Behavioral: Negative for sleep disturbance, dysphoric mood and agitation. The patient is not nervous/anxious.        Objective:   Physical Exam  Constitutional: He is oriented to person, place, and time. He appears well-developed.  HENT:  Mouth/Throat: Oropharynx is clear and moist.  Eyes: Conjunctivae are normal. Pupils are equal, round, and reactive to light.  Neck: Normal range of motion. No JVD present. No thyromegaly present.  Cardiovascular: Normal rate, regular rhythm, normal heart sounds and intact distal pulses.  Exam reveals no gallop and no friction rub.   No murmur heard. Pulmonary/Chest: Effort normal and breath sounds normal. No respiratory distress. He has no wheezes. He has no rales. He exhibits no tenderness.  Abdominal: Soft. Bowel sounds are normal. He exhibits no distension and no mass. There is no tenderness. There is no rebound and no guarding.  Musculoskeletal: Normal range of motion.Marland Kitchen He exhibits no tenderness.  Lymphadenopathy:    He has no cervical  adenopathy.  Neurological: He is alert and oriented to person, place, and time. He has normal reflexes. No cranial nerve deficit. He exhibits normal muscle tone. Coordination normal.  Skin: Skin is warm and dry. No rash noted. Perianal dermatitis - extensive Psychiatric: He has a normal mood and affect. His behavior is normal. Judgment and thought content normal.  R knee flexion is restricted Wt Readings from Last 3 Encounters:  03/08/11 160 lb (72.576 kg)  02/03/11 157 lb 8 oz (71.442 kg)  01/30/11 156 lb (70.761 kg)          Assessment & Plan:

## 2012-02-13 NOTE — Patient Instructions (Addendum)
Nylon underwear

## 2012-02-13 NOTE — Assessment & Plan Note (Signed)
Lotrisone bid  

## 2012-04-05 ENCOUNTER — Other Ambulatory Visit: Payer: BC Managed Care – PPO | Admitting: Gastroenterology

## 2012-05-05 ENCOUNTER — Emergency Department (INDEPENDENT_AMBULATORY_CARE_PROVIDER_SITE_OTHER)
Admission: EM | Admit: 2012-05-05 | Discharge: 2012-05-05 | Disposition: A | Discharge status: Home / Self Care | Payer: Medicare Other | Source: Home / Self Care | Attending: Emergency Medicine | Admitting: Emergency Medicine

## 2012-05-05 ENCOUNTER — Encounter (HOSPITAL_COMMUNITY): Payer: Self-pay | Admitting: *Deleted

## 2012-05-05 DIAGNOSIS — N529 Male erectile dysfunction, unspecified: Secondary | ICD-10-CM

## 2012-05-05 DIAGNOSIS — B356 Tinea cruris: Secondary | ICD-10-CM

## 2012-05-05 MED ORDER — TADALAFIL 20 MG PO TABS: 20.0000 mg | ORAL_TABLET | Freq: Every day | ORAL | Status: DC | PRN

## 2012-05-05 MED ORDER — TERBINAFINE HCL 1 % EX CREA: TOPICAL_CREAM | Freq: Two times a day (BID) | CUTANEOUS | Status: DC

## 2012-05-05 MED ORDER — DOMEBORO 25 % EX PACK: PACK | CUTANEOUS | Status: DC

## 2012-05-05 NOTE — ED Notes (Signed)
Patient complains of rash around groin. Patient states rash started in October and was treated by Dr. Posey Rea, but rash never went away. Now rash is worse.

## 2012-05-05 NOTE — ED Provider Notes (Signed)
Chief Complaint  Patient presents with  . Rash    History of Present Illness:   Alan Wall is a 67 year old male who has had a three-month history of an itchy, painful rash in his groin bilaterally, extending to the perianal area. He saw his primary care physician, Dr. Posey Rea, for this when it first occurred. He was prescribed Lotrisone cream. This seemed to help but didn't completely clear it up. He still has some residual rash, itching, and soreness, there was no where near as bad as it was. It still localized to the bilateral groin area, not involving the scrotum or the penis, but it does progress to the perianal area as well.  He would also like to have refill on his Cialis as well.  Review of Systems:  Other than noted above, the patient denies any of the following symptoms: Systemic:  No fever, chills, sweats, weight loss, or fatigue. ENT:  No nasal congestion, rhinorrhea, sore throat, swelling of lips, tongue or throat. Resp:  No cough, wheezing, or shortness of breath. Skin:  No rash, itching, nodules, or suspicious lesions.  PMFSH:  Past medical history, family history, social history, meds, and allergies were reviewed.  Physical Exam:   Vital signs:  BP 141/94  Pulse 60  Temp 98.5 F (36.9 C) (Oral)  Resp 16  SpO2 100% Gen:  Alert, oriented, in no distress. ENT:  Pharynx clear, no intraoral lesions, moist mucous membranes. Lungs:  Clear to auscultation. Skin:  There is some hyperpigmentation in the groin area bilaterally and some nodularity and erythema in the inguinal crease. This does not involve the scrotum or penis. The perianal area shows some hyperpigmentation and excoriation.  Assessment:  The primary encounter diagnosis was Tinea cruris. A diagnosis of Erectile dysfunction was also pertinent to this visit.  Plan:   1.  The following meds were prescribed:   New Prescriptions   ALUM SULFATE-CA ACETATE (DOMEBORO) 25 % PACK    Dissolve 8 packets in 1 gallon of water,  use as cool compress twice daily   TADALAFIL (CIALIS) 20 MG TABLET    Take 1 tablet (20 mg total) by mouth daily as needed for erectile dysfunction.   TERBINAFINE (LAMISIL) 1 % CREAM    Apply topically 2 (two) times daily.   2.  The patient was instructed in symptomatic care and handouts were given. 3.  The patient was told to return if becoming worse in any way, if no better in 3 or 4 days, and given some red flag symptoms that would indicate earlier return.     Alan Likes, MD 05/05/12 [phone]

## 2012-05-20 ENCOUNTER — Encounter: Payer: Self-pay | Admitting: Internal Medicine

## 2012-05-20 ENCOUNTER — Ambulatory Visit (INDEPENDENT_AMBULATORY_CARE_PROVIDER_SITE_OTHER): Payer: Medicare Other | Admitting: Internal Medicine

## 2012-05-20 VITALS — BP 126/80 | HR 72 | Temp 97.8°F | Resp 16 | Ht 67.5 in | Wt 160.0 lb

## 2012-05-20 DIAGNOSIS — Z0000 Encounter for general adult medical examination without abnormal findings: Secondary | ICD-10-CM | POA: Insufficient documentation

## 2012-05-20 DIAGNOSIS — E785 Hyperlipidemia, unspecified: Secondary | ICD-10-CM

## 2012-05-20 DIAGNOSIS — R5381 Other malaise: Secondary | ICD-10-CM

## 2012-05-20 DIAGNOSIS — Z1211 Encounter for screening for malignant neoplasm of colon: Secondary | ICD-10-CM

## 2012-05-20 DIAGNOSIS — F439 Reaction to severe stress, unspecified: Secondary | ICD-10-CM

## 2012-05-20 DIAGNOSIS — L290 Pruritus ani: Secondary | ICD-10-CM

## 2012-05-20 DIAGNOSIS — Z733 Stress, not elsewhere classified: Secondary | ICD-10-CM

## 2012-05-20 DIAGNOSIS — N320 Bladder-neck obstruction: Secondary | ICD-10-CM

## 2012-05-20 MED ORDER — LOVASTATIN 20 MG PO TABS: 20.0000 mg | ORAL_TABLET | Freq: Every day | ORAL | Status: DC

## 2012-05-20 NOTE — Assessment & Plan Note (Signed)
Better  

## 2012-05-20 NOTE — Assessment & Plan Note (Signed)
resolved 

## 2012-05-20 NOTE — Assessment & Plan Note (Addendum)
The patient is here for annual Medicare wellness examination and management of other chronic and acute problems.   The risk factors are reflected in the social history.  The roster of all physicians providing medical care to patient - is listed in the Snapshot section of the chart.  Activities of daily living:  The patient is 100% inedpendent in all ADLs: dressing, toileting, feeding as well as independent mobility  Home safety : good. Using seatbelts. There is no violence in the home.   There is no risks for hepatitis, STDs or HIV. There is no history of blood transfusion. They have no travel history to infectious disease endemic areas of the world.  The patient has  seen their dentist in the last 12 month. They have  seen their eye doctor in the last year. They deny  Any major hearing difficulty and have not had audiologic testing in the last year.  They do not  have excessive sun exposure. Discussed the need for sun protection: hats, long sleeves and use of sunscreen if there is significant sun exposure.   Diet: the importance of a healthy diet is discussed. They do have a reasonably healthy  diet.  The patient has a fairly regular exercise program of a mixed nature: biking, walking, yard work, etc.The benefits of regular aerobic exercise were discussed.  Depression screen: there are no signs or vegative symptoms of depression- irritability, change in appetite, anhedonia, sadness/tearfullness.  Cognitive assessment: the patient manages all their financial and personal affairs and is actively engaged. They could relate day,date,year and events; recalled 3/3 objects at 3 minutes  The following portions of the patient's history were reviewed and updated as appropriate: allergies, current medications, past family history, past medical history,  past surgical history, past social history  and problem list.  Vision, hearing, body mass index were assessed and reviewed.   During the course of  the visit the patient was educated and counseled about appropriate screening and preventive services including : fall prevention , diabetes screening, nutrition counseling, colorectal cancer screening, and recommended immunizations.  Colon - Dr Leone Payor

## 2012-05-20 NOTE — Assessment & Plan Note (Signed)
Continue with current prescription therapy as reflected on the Med list.  

## 2012-05-20 NOTE — Progress Notes (Signed)
Subjective:    HPI  The patient is here for a wellness exam. The patient has been doing well overall without major physical or psychological issues going on lately. C/o jock itch  Review of Systems  Constitutional: Negative for appetite change, fatigue and unexpected weight change.  HENT: Negative for nosebleeds, congestion, sore throat, sneezing, trouble swallowing, neck pain and ear discharge.   Eyes: Negative for itching and visual disturbance.  Respiratory: Negative for cough.   Cardiovascular: Negative for chest pain, palpitations and leg swelling.  Gastrointestinal: Negative for nausea, vomiting, diarrhea, blood in stool and abdominal distention.  Genitourinary: Negative for frequency and hematuria.  Musculoskeletal: Negative for back pain, joint swelling and gait problem.  Skin: Negative for rash.  Neurological: Negative for dizziness, tremors, speech difficulty and weakness.  Hematological: Does not bruise/bleed easily.  Psychiatric/Behavioral: Negative for suicidal ideas, confusion, sleep disturbance, dysphoric mood and agitation. The patient is not nervous/anxious.    BP Readings from Last 3 Encounters:  05/20/12 126/80  05/05/12 141/94  02/13/12 104/80   Wt Readings from Last 3 Encounters:  05/20/12 160 lb (72.576 kg)  02/13/12 158 lb (71.668 kg)  03/08/11 160 lb (72.576 kg)        Objective:   Physical Exam  Constitutional: He is oriented to person, place, and time. He appears well-developed and well-nourished. No distress.  HENT:  Head: Normocephalic and atraumatic.  Right Ear: External ear normal.  Left Ear: External ear normal.  Nose: Nose normal.  Mouth/Throat: Oropharynx is clear and moist. No oropharyngeal exudate.  Eyes: Conjunctivae normal and EOM are normal. Pupils are equal, round, and reactive to light. Right eye exhibits no discharge. Left eye exhibits no discharge. No scleral icterus.  Neck: Normal range of motion. Neck supple. No JVD present. No  tracheal deviation present. No thyromegaly present.  Cardiovascular: Normal rate, regular rhythm, normal heart sounds and intact distal pulses.  Exam reveals no gallop and no friction rub.   No murmur heard. Pulmonary/Chest: Effort normal and breath sounds normal. No stridor. No respiratory distress. He has no wheezes. He has no rales. He exhibits no tenderness.  Abdominal: Soft. Bowel sounds are normal. He exhibits no distension and no mass. There is no tenderness. There is no rebound and no guarding.  Genitourinary: Penis normal. Guaiac negative stool. No penile tenderness.       Rectal per GI - pending Peri -anal skin irritation is present  Musculoskeletal: Normal range of motion. He exhibits no edema and no tenderness.  Lymphadenopathy:    He has no cervical adenopathy.  Neurological: He is alert and oriented to person, place, and time. He has normal reflexes. No cranial nerve deficit. He exhibits normal muscle tone. Coordination normal.  Skin: Skin is warm and dry. Rash noted. He is not diaphoretic. No erythema. No pallor.  Psychiatric: He has a normal mood and affect. His behavior is normal. Judgment and thought content normal.    Lab Results  Component Value Date   WBC 5.6 05/23/2010   HGB 14.9 05/23/2010   HCT 43.6 05/23/2010   PLT 224.0 05/23/2010   GLUCOSE 92 05/23/2010   CHOL 158 05/23/2010   TRIG 44.0 05/23/2010   HDL 52.80 05/23/2010   LDLCALC 96 05/23/2010   ALT 20 05/23/2010   AST 22 05/23/2010   NA 137 05/23/2010   K 4.4 05/23/2010   CL 102 05/23/2010   CREATININE 1.0 05/23/2010   BUN 15 05/23/2010   CO2 27 05/23/2010   TSH 1.58 05/23/2010  PSA 1.40 05/23/2010         Assessment & Plan:

## 2012-05-20 NOTE — Assessment & Plan Note (Signed)
Better Rx prn

## 2012-05-24 ENCOUNTER — Encounter: Payer: Self-pay | Admitting: Gastroenterology

## 2012-06-08 ENCOUNTER — Other Ambulatory Visit: Payer: Self-pay

## 2012-06-09 ENCOUNTER — Encounter (HOSPITAL_COMMUNITY): Payer: Self-pay | Admitting: Emergency Medicine

## 2012-06-09 ENCOUNTER — Emergency Department (INDEPENDENT_AMBULATORY_CARE_PROVIDER_SITE_OTHER)
Admission: EM | Admit: 2012-06-09 | Discharge: 2012-06-09 | Disposition: A | Discharge status: Home / Self Care | Payer: Medicare Other | Source: Home / Self Care | Attending: Emergency Medicine | Admitting: Emergency Medicine

## 2012-06-09 DIAGNOSIS — L290 Pruritus ani: Secondary | ICD-10-CM

## 2012-06-09 MED ORDER — DOMEBORO 25 % EX PACK: PACK | CUTANEOUS | Status: DC

## 2012-06-09 MED ORDER — HYDROCORTISONE 1 % EX CREA: TOPICAL_CREAM | Freq: Two times a day (BID) | CUTANEOUS | Status: DC

## 2012-06-09 MED ORDER — NYSTATIN 100000 UNIT/GM EX CREA: TOPICAL_CREAM | Freq: Two times a day (BID) | CUTANEOUS | Status: DC

## 2012-06-09 NOTE — ED Provider Notes (Signed)
History     CSN: [id number]  Arrival date & time 06/09/12  1131   First MD Initiated Contact with Patient 06/09/12 1134      Chief Complaint  Patient presents with  . Follow-up    jock itch follow up.     (Consider location/radiation/quality/duration/timing/severity/associated sxs/prior treatment) HPI Comments: Mr. Alan Wall. Wall is a 67 year old male who was here month ago for symptoms tinea cruris. He was given terbinafine cream and Domeboro solution to apply topically. He's been doing this and his groin rash and itching have cleared up. He has been left with some perianal cracking and itching. There is been no rash in the area. He does have a dry skin and no scratching of his fingertips and his heels.  The history is provided by the patient.    Past Medical History  Diagnosis Date  . Hyperlipidemia   . GERD (gastroesophageal reflux disease)   . Allergy     Food  . S/P dilatation of esophageal stricture   . Hematoma of leg     History reviewed. No pertinent past surgical history.  Family History  Problem Relation Age of Onset  . Heart disease Father     CABG at 40 yo  . Prostate cancer Paternal Uncle   . Coronary artery disease Other     History  Substance Use Topics  . Smoking status: Never Smoker   . Smokeless tobacco: Not on file  . Alcohol Use: 1.2 oz/week    2 Glasses of wine per week      Review of Systems  Constitutional: Negative for fever and chills.  HENT: Negative for facial swelling.   Respiratory: Negative for shortness of breath.   Skin: Negative for color change, pallor, rash and wound.    Allergies  Review of patient's allergies indicates no known allergies.  Home Medications   Current Outpatient Rx  Name  Route  Sig  Dispense  Refill  . aspirin 81 MG tablet   Oral   Take 81 mg by mouth every other day.          . clotrimazole-betamethasone (LOTRISONE) cream   Topical   Apply topically 2 (two) times daily.   90 g   1   .  lovastatin (MEVACOR) 20 MG tablet   Oral   Take 1 tablet (20 mg total) by mouth at bedtime.   90 tablet   3   . tadalafil (CIALIS) 20 MG tablet   Oral   Take 1 tablet (20 mg total) by mouth daily as needed for erectile dysfunction.   10 tablet   12   . terbinafine (LAMISIL) 1 % cream   Topical   Apply topically 2 (two) times daily.   30 g   12   . Alum Sulfate-Ca Acetate (DOMEBORO) 25 % PACK      Dissolve 8 packets in 1 gallon of water, use as cool compress twice daily   8 each   12   . Alum Sulfate-Ca Acetate (DOMEBORO) 25 % PACK      Dissolve 8 packets in 1 gallon of water and apply as a moist compress BID.   8 each   3   . Cholecalciferol 1000 UNITS tablet   Oral   Take 1,000 Units by mouth daily.           Marland Kitchen EPINEPHrine (EPIPEN 2-PAK) 0.3 mg/0.3 mL DEVI   Intramuscular   Inject 0.3 mg into the muscle as directed.           Marland Kitchen  hydrocortisone cream 1 %   Topical   Apply topically 2 (two) times daily.   30 g   3   . nystatin cream (MYCOSTATIN)   Topical   Apply topically 2 (two) times daily.   30 g   3   . zolpidem (AMBIEN) 10 MG tablet   Oral   Take 1 tablet (10 mg total) by mouth at bedtime as needed.   30 tablet   3     BP 131/81  Pulse 65  Temp(Src) 98.2 F (36.8 C) (Oral)  Resp 17  SpO2 97%  Physical Exam  Nursing note and vitals reviewed. Constitutional: He appears well-developed and well-nourished. No distress.  HENT:  Head: Normocephalic and atraumatic.  Right Ear: External ear normal.  Left Ear: External ear normal.  Nose: Nose normal.  Mouth/Throat: Oropharynx is clear and moist.  Pulmonary/Chest: Effort normal and breath sounds normal.  Skin: Skin is warm and dry. Rash noted. No abrasion, no bruising, no ecchymosis and no lesion noted. He is not diaphoretic. No erythema. No pallor.  Rash in the groin area has cleared up completely and his genital area appears normal. He does have some cracking in the perianal cleft both  anterior and posterior to the anus. There is no perianal rash or dermatitis.    ED Course  Procedures (including critical care time)  Labs Reviewed - No data to display No results found.   1. Pruritus ani    This could be due to fungal or chronic candidal infection or could be contact dermatitis.   MDM  I suggested continued application the Domeboro to the cracked areas followed by nystatin +1% hydrocortisone. If this fails to clear up after a month suggested that he see a dermatologist. He was given the name of Dr. Para Skeans.        Reuben Likes, MD 06/09/12 [phone]

## 2012-06-09 NOTE — ED Notes (Signed)
Pt is here for follow up of jock itch. Pt states that he is still having some mild irritation. And has some severe dryness and cracking of skin on hands and buttock/pelvic area.  Pt has been using ceptaphil for washing genital area with mild relief.

## 2012-06-12 ENCOUNTER — Other Ambulatory Visit (INDEPENDENT_AMBULATORY_CARE_PROVIDER_SITE_OTHER): Payer: Medicare Other

## 2012-06-12 DIAGNOSIS — R5383 Other fatigue: Secondary | ICD-10-CM

## 2012-06-12 DIAGNOSIS — Z0000 Encounter for general adult medical examination without abnormal findings: Secondary | ICD-10-CM

## 2012-06-12 DIAGNOSIS — L290 Pruritus ani: Secondary | ICD-10-CM

## 2012-06-12 DIAGNOSIS — N320 Bladder-neck obstruction: Secondary | ICD-10-CM

## 2012-06-12 DIAGNOSIS — Z1211 Encounter for screening for malignant neoplasm of colon: Secondary | ICD-10-CM

## 2012-06-12 DIAGNOSIS — E785 Hyperlipidemia, unspecified: Secondary | ICD-10-CM

## 2012-06-12 LAB — BASIC METABOLIC PANEL
Calcium: 9.3 mg/dL (ref 8.4–10.5)
GFR: 71.15 mL/min (ref >60.00)
Glucose, Bld: 93 mg/dL (ref 70–99)
Potassium: 4.5 mEq/L (ref 3.5–5.1)
Sodium: 140 mEq/L (ref 135–145)

## 2012-06-12 LAB — TSH: TSH: 1.55 u[IU]/mL (ref 0.35–5.50)

## 2012-06-12 LAB — CBC WITH DIFFERENTIAL/PLATELET
Basophils Absolute: 0 10*3/uL (ref 0.0–0.1)
Eosinophils Relative: 3.5 % (ref 0.0–5.0)
HCT: 45.6 % (ref 39.0–52.0)
Lymphs Abs: 1.7 10*3/uL (ref 0.7–4.0)
MCV: 97.1 fl (ref 78.0–100.0)
Monocytes Absolute: 0.6 10*3/uL (ref 0.1–1.0)
Neutrophils Relative %: 46.9 % (ref 43.0–77.0)
Platelets: 220 10*3/uL (ref 150.0–400.0)
RDW: 13 % (ref 11.5–14.6)
WBC: 4.6 10*3/uL (ref 4.5–10.5)

## 2012-06-12 LAB — URINALYSIS, ROUTINE W REFLEX MICROSCOPIC
Bilirubin Urine: NEGATIVE
Ketones, ur: NEGATIVE
Nitrite: NEGATIVE
Specific Gravity, Urine: 1.025 (ref 1.000–1.030)
Total Protein, Urine: NEGATIVE
pH: 6 (ref 5.0–8.0)

## 2012-06-12 LAB — LIPID PANEL
Cholesterol: 210 mg/dL — ABNORMAL HIGH (ref 0–200)
HDL: 52.7 mg/dL (ref >39.00)
Total CHOL/HDL Ratio: 4
Triglycerides: 92 mg/dL (ref 0.0–149.0)

## 2012-06-12 LAB — HEPATIC FUNCTION PANEL
Bilirubin, Direct: 0.1 mg/dL (ref 0.0–0.3)
Total Bilirubin: 0.9 mg/dL (ref 0.3–1.2)
Total Protein: 6.2 g/dL (ref 6.0–8.3)

## 2012-06-12 LAB — LDL CHOLESTEROL, DIRECT: Direct LDL: 125.2 mg/dL

## 2012-06-17 ENCOUNTER — Ambulatory Visit (AMBULATORY_SURGERY_CENTER): Payer: Medicare Other | Admitting: *Deleted

## 2012-06-17 VITALS — Ht 68.0 in | Wt 165.0 lb

## 2012-06-17 DIAGNOSIS — Z1211 Encounter for screening for malignant neoplasm of colon: Secondary | ICD-10-CM

## 2012-06-17 MED ORDER — SUPREP BOWEL PREP KIT 17.5-3.13-1.6 GM/177ML PO SOLN: ORAL | Status: DC

## 2012-07-01 ENCOUNTER — Encounter: Payer: Self-pay | Admitting: Gastroenterology

## 2012-07-01 ENCOUNTER — Ambulatory Visit (AMBULATORY_SURGERY_CENTER): Payer: Medicare Other | Admitting: Gastroenterology

## 2012-07-01 VITALS — BP 113/76 | HR 65 | Temp 97.1°F | Resp 18 | Ht 68.0 in | Wt 165.0 lb

## 2012-07-01 DIAGNOSIS — Z1211 Encounter for screening for malignant neoplasm of colon: Secondary | ICD-10-CM

## 2012-07-01 DIAGNOSIS — D126 Benign neoplasm of colon, unspecified: Secondary | ICD-10-CM

## 2012-07-01 DIAGNOSIS — K5730 Diverticulosis of large intestine without perforation or abscess without bleeding: Secondary | ICD-10-CM

## 2012-07-01 MED ORDER — SODIUM CHLORIDE 0.9 % IV SOLN: 500.0000 mL | INTRAVENOUS | Status: DC

## 2012-07-01 NOTE — Op Note (Signed)
[facility] [address] [place], [address]   COLONOSCOPY PROCEDURE REPORT  PATIENT: Alan Wall, Alan Wall  MR#: [id number] BIRTHDATE: 1946-04-07 , 66  yrs. old GENDER: Male ENDOSCOPIST: Rachael Fee, MD REFERRED HK:VQQV Buckner [place], M.D. PROCEDURE DATE:  07/01/2012 PROCEDURE:   Colonoscopy with biopsy and Colonoscopy with snare polypectomy ASA CLASS:   Class II INDICATIONS:average risk screening MEDICATIONS: Fentanyl 50 mcg IV and Versed 7 mg IV  DESCRIPTION OF PROCEDURE:   After the risks benefits and alternatives of the procedure were thoroughly explained, informed consent was obtained.  A digital rectal exam revealed no abnormalities of the rectum.   The LB [id number]  endoscope was introduced through the anus and advanced to the cecum, which was identified by both the appendix and ileocecal valve. No adverse events experienced.   The quality of the prep was good, using MoviPrep  The instrument was then slowly withdrawn as the colon was fully examined.  COLON FINDINGS: Two small polyps were found, removed and sent to pathology.  One was 1-22mm, sessile, descending segment, removed with cold forceps.  The other was 2mm, sessile, sigmoid segment, removed with cold snare.  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  The examination was otherwise normal.  Retroflexed views revealed no abnormalities. The time to cecum=2 minutes 47 seconds. Withdrawal time=11 minutes 21 seconds.  The scope was withdrawn and the procedure completed. COMPLICATIONS: There were no complications.  ENDOSCOPIC IMPRESSION: Two small polyps were found, removed and sent to pathology. 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 The examination was otherwise normal.  RECOMMENDATIONS: If the polyp(s) removed today are proven to be adenomatous (pre-cancerous) polyps, you will need a repeat colonoscopy in 5 years.  Otherwise you should continue to follow colorectal  cancer screening guidelines for "routine risk" patients with colonoscopy in 10 years.  You will receive a letter within 1-2 weeks with the results of your biopsy as well as final recommendations.  Please call my office if you have not received a letter after 3 weeks.   eSigned:  Rachael Fee, MD 07/01/2012 10:56 AM

## 2012-07-01 NOTE — Progress Notes (Addendum)
Patient did not have preoperative order for IV antibiotic SSI prophylaxis. (G8918)  Patient did not experience any of the following events: a burn prior to discharge; a fall within the facility; wrong site/side/patient/procedure/implant event; or a hospital transfer or hospital admission upon discharge from the facility. (G8907)  

## 2012-07-01 NOTE — Patient Instructions (Addendum)

## 2012-07-02 ENCOUNTER — Telehealth: Payer: Self-pay

## 2012-07-02 NOTE — Telephone Encounter (Signed)
  Follow up Call-  Call back number 07/01/2012  Post procedure Call Back phone  # [phone]  Permission to leave phone message No     Patient questions:  Do you have a fever, pain , or abdominal swelling? no Pain Score  0 *  Have you tolerated food without any problems? yes  Have you been able to return to your normal activities? yes  Do you have any questions about your discharge instructions: Diet   no Medications  no Follow up visit  no  Do you have questions or concerns about your Care? no  Actions: * If pain score is 4 or above: No action needed, pain <4.

## 2012-07-09 ENCOUNTER — Encounter: Payer: Self-pay | Admitting: Gastroenterology

## 2013-10-16 ENCOUNTER — Emergency Department (HOSPITAL_COMMUNITY)
Admission: EM | Admit: 2013-10-16 | Discharge: 2013-10-16 | Disposition: A | Discharge status: Home / Self Care | Payer: Medicare Other | Source: Home / Self Care | Attending: Family Medicine | Admitting: Family Medicine

## 2013-10-16 ENCOUNTER — Encounter (HOSPITAL_COMMUNITY): Payer: Self-pay | Admitting: Emergency Medicine

## 2013-10-16 DIAGNOSIS — G5601 Carpal tunnel syndrome, right upper limb: Secondary | ICD-10-CM

## 2013-10-16 DIAGNOSIS — G5600 Carpal tunnel syndrome, unspecified upper limb: Secondary | ICD-10-CM

## 2013-10-16 DIAGNOSIS — G5611 Other lesions of median nerve, right upper limb: Secondary | ICD-10-CM

## 2013-10-16 DIAGNOSIS — G5610 Other lesions of median nerve, unspecified upper limb: Secondary | ICD-10-CM

## 2013-10-16 NOTE — ED Notes (Signed)
Triaged by jamie rose, rn

## 2013-10-16 NOTE — Discharge Instructions (Signed)
Ibuprofen or Aleve as directed on packaging for pain. Splint and ice as needed for comfort. You may wish to install either elbow rest or bullhorn handle bars to take some of the tension off wrists (carpal tunnel and median nerve) to reduce carpal tunnel syndrome symptoms.  Carpal Tunnel Syndrome The carpal tunnel is a narrow area located on the palm side of your wrist. The tunnel is formed by the wrist bones and ligaments. Nerves, blood vessels, and tendons pass through the carpal tunnel. Repeated wrist motion or certain diseases may cause swelling within the tunnel. This swelling pinches the main nerve in the wrist (median nerve) and causes the painful hand and arm condition called carpal tunnel syndrome. CAUSES   Repeated wrist motions.  Wrist injuries.  Certain diseases like arthritis, diabetes, alcoholism, hyperthyroidism, and kidney failure.  Obesity.  Pregnancy. SYMPTOMS   A "pins and needles" feeling in your fingers or hand.  Tingling or numbness in your fingers or hand.  An aching feeling in your entire arm.  Wrist pain that goes up your arm to your shoulder.  Pain that goes down into your palm or fingers.  A weak feeling in your hands. DIAGNOSIS  Your caregiver will take your history and perform a physical exam. An electromyography test may be needed. This test measures electrical signals sent out by the muscles. The electrical signals are usually slowed by carpal tunnel syndrome. You may also need X-rays. TREATMENT  Carpal tunnel syndrome may clear up by itself. Your caregiver may recommend a wrist splint or medicine such as a nonsteroidal anti-inflammatory medicine. Cortisone injections may help. Sometimes, surgery may be needed to free the pinched nerve.  HOME CARE INSTRUCTIONS   Take all medicine as directed by your caregiver. Only take over-the-counter or prescription medicines for pain, discomfort, or fever as directed by your caregiver.  If you were given a splint  to keep your wrist from bending, wear it as directed. It is important to wear the splint at night. Wear the splint for as long as you have pain or numbness in your hand, arm, or wrist. This may take 1 to 2 months.  Rest your wrist from any activity that may be causing your pain. If your symptoms are work-related, you may need to talk to your employer about changing to a job that does not require using your wrist.  Put ice on your wrist after long periods of wrist activity.  Put ice in a plastic bag.  Place a towel between your skin and the bag.  Leave the ice on for 15-20 minutes, 03-04 times a day.  Keep all follow-up visits as directed by your caregiver. This includes any orthopedic referrals, physical therapy, and rehabilitation. Any delay in getting necessary care could result in a delay or failure of your condition to heal. SEEK IMMEDIATE MEDICAL CARE IF:   You have new, unexplained symptoms.  Your symptoms get worse and are not helped or controlled with medicines. MAKE SURE YOU:   Understand these instructions.  Will watch your condition.  Will get help right away if you are not doing well or get worse. Document Released: 04/07/2000 Document Revised: 07/03/2011 Document Reviewed: 02/24/2011 [facility] Patient Information 2015 [place], [place]. This information is not intended to replace advice given to you by your health care provider. Make sure you discuss any questions you have with your health care provider.  Pinched Nerve The term pinched nerve describes one type of damage or injury to a nerve or set  of nerves. Pinched nerves can sometimes lead to other conditions. These include peripheral neuropathy, carpal tunnel syndrome, and tennis elbow. The extent of such injuries may vary from minor, temporary damage to a more permanent condition. Early diagnosis is important to prevent further damage or complications. Pinched nerve is a common cause of on-the-job injury. CAUSES  The  injury may result from:  Compression.  Constriction.  Stretching. SYMPTOMS  Symptoms include:  Numbness.  "Pins and needles" or burning sensations.  Pain radiating outward from the injured area.  One of the most common examples of a single compressed nerve is the feeling of having a foot or hand "fall asleep." TREATMENT  The most often recommended treatment for pinched nerve is rest for the affected area. Corticosteroids help alleviate pain. In some cases, surgery is recommended. Physical therapy may be recommended. Splints or collars may be used. With treatment, most people recover from pinched nerve. In some cases, the damage is irreversible. Document Released: 03/31/2002 Document Revised: 07/03/2011 Document Reviewed: 03/18/2008 [facility] Patient Information 2015 [place], [place]. This information is not intended to replace advice given to you by your health care provider. Make sure you discuss any questions you have with your health care provider.

## 2013-10-16 NOTE — ED Provider Notes (Signed)
CSN: [id number]     Arrival date & time 10/16/13  1021 History   First MD Initiated Contact with Patient 10/16/13 1053     Chief Complaint  Patient presents with  . Right Hand Pain    (Consider location/radiation/quality/duration/timing/severity/associated sxs/prior Treatment) HPI Comments: Patient is an avid cyclist and has noticed some tingling discomfort at right thumb and index finger and that he has some residual discomfort at base of right thumb following ride yesterday. States this has happened in the past and as a result he has made some modifications to his handle bars, but will still have difficulties from time to time. Has also noticed some minor difficulties when trying to grip objects with right thumb, index and middle fingers. No ecchymosis or STS.   The history is provided by the patient.    Past Medical History  Diagnosis Date  . Hyperlipidemia   . S/P dilatation of esophageal stricture   . Hematoma of leg   . Allergy     Food/cat dander/bee stings   Past Surgical History  Procedure Laterality Date  . Cataract extraction w/ intraocular lens  implant, bilateral  2010   Family History  Problem Relation Age of Onset  . Heart disease Father     CABG at 43 yo  . Prostate cancer Paternal Uncle   . Coronary artery disease Other    History  Substance Use Topics  . Smoking status: Never Smoker   . Smokeless tobacco: Never Used  . Alcohol Use: 1.2 oz/week    2 Glasses of wine per week    Review of Systems  All other systems reviewed and are negative.   Allergies  Bee venom  Home Medications   Prior to Admission medications   Medication Sig Start Date End Date Taking? Authorizing Provider  aspirin 81 MG tablet Take 81 mg by mouth every other day.     Historical Provider, MD  EPINEPHrine (EPIPEN 2-PAK) 0.3 mg/0.3 mL DEVI Inject 0.3 mg into the muscle as directed.      Historical Provider, MD  hydrocortisone cream 1 % Apply topically 2 (two) times daily.  06/09/12   Harden Mo, MD  nystatin cream (MYCOSTATIN) Apply topically 2 (two) times daily. 06/09/12   Harden Mo, MD  zolpidem (AMBIEN) 10 MG tablet Take 1 tablet (10 mg total) by mouth at bedtime as needed. 03/08/11   Aleksei Plotnikov V, MD   BP 126/79  Pulse 55  Temp(Src) 98.2 F (36.8 C) (Oral)  Resp 16  SpO2 96% Physical Exam  Nursing note and vitals reviewed. Constitutional: He is oriented to person, place, and time. He appears well-developed and well-nourished. No distress.  HENT:  Head: Normocephalic and atraumatic.  Eyes: Conjunctivae are normal. No scleral icterus.  Cardiovascular: Normal rate.   Pulmonary/Chest: Effort normal.  Musculoskeletal: Normal range of motion. He exhibits no edema.       Right hand: He exhibits tenderness. He exhibits normal range of motion, no bony tenderness, normal two-point discrimination, normal capillary refill, no deformity, no laceration and no swelling. Normal sensation noted. He exhibits no finger abduction, no thumb/finger opposition and no wrist extension trouble.       Hands: Describes paraesthesias in median nerve distribution, but patient is without strength, functional or sensory deficit on exam today. Radial pulse intact  Neurological: He is alert and oriented to person, place, and time.  Skin: Skin is warm and dry. No rash noted. No erythema.  Psychiatric: He has a normal  mood and affect. His behavior is normal.    ED Course  Procedures (including critical care time) Labs Review Labs Reviewed - No data to display  Imaging Review No results found.   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 Median nerve neuropathy, right    Advised using ibuprofen as directed on packaging for pain. Provided with ice pack and velcro wrist splint for use for comfort. Advised patient that he may wish to install either elbow rest or bullhorn handle bars to take some of the tension off his wrists (carpal tunnel and median nerve)  to reduce carpal tunnel syndrome symptoms.   [place], [place] 10/16/13 1200

## 2013-10-16 NOTE — ED Notes (Signed)
Patient noticed yesterday while riding his bike that his right hand started to tingle. Patient states the pain is more of the problem than tingling. Patient states he has pain when he moves the thumb. Pain is 7/10. Patient also states he is unable to get a good grip with right hand. Patient denies any falls. Patient does use thumb to change gears on bike. Marya Amsler, RN

## 2013-10-17 NOTE — ED Provider Notes (Signed)
Medical screening examination/treatment/procedure(s) were performed by a resident physician or non-physician practitioner and as the supervising physician I was immediately available for consultation/collaboration.  Lynne Leader, MD    Gregor Hams, MD 10/17/13 [phone]

## 2013-11-27 ENCOUNTER — Encounter: Payer: Self-pay | Admitting: Internal Medicine

## 2013-11-28 ENCOUNTER — Telehealth: Payer: Self-pay | Admitting: Internal Medicine

## 2013-11-28 NOTE — Telephone Encounter (Signed)
Pt request written Rx for Pravachol, pt stated Dr. Manuela Schwartz was the the one that initiated this med. Please advise, pt has an appt 12/12/13. Please call pt

## 2013-12-01 MED ORDER — PRAVASTATIN SODIUM 40 MG PO TABS: 40.0000 mg | ORAL_TABLET | Freq: Every day | ORAL | Status: DC

## 2013-12-01 NOTE — Telephone Encounter (Signed)
Rx faxed to pt's pharmacy.  

## 2013-12-01 NOTE — Telephone Encounter (Signed)
Pt called back. Dose is 40mg .

## 2013-12-01 NOTE — Telephone Encounter (Signed)
Ok. Pls mail Thx 

## 2013-12-01 NOTE — Telephone Encounter (Signed)
Left massage for pt to call back.  °

## 2013-12-01 NOTE — Telephone Encounter (Signed)
What is the dose? Thx

## 2013-12-12 ENCOUNTER — Ambulatory Visit (INDEPENDENT_AMBULATORY_CARE_PROVIDER_SITE_OTHER): Payer: Medicare Other | Admitting: Internal Medicine

## 2013-12-12 ENCOUNTER — Encounter: Payer: Self-pay | Admitting: Internal Medicine

## 2013-12-12 ENCOUNTER — Other Ambulatory Visit (INDEPENDENT_AMBULATORY_CARE_PROVIDER_SITE_OTHER): Payer: Medicare Other

## 2013-12-12 VITALS — BP 116/62 | HR 56 | Temp 97.4°F | Resp 16 | Wt 163.0 lb

## 2013-12-12 DIAGNOSIS — Z23 Encounter for immunization: Secondary | ICD-10-CM

## 2013-12-12 DIAGNOSIS — E785 Hyperlipidemia, unspecified: Secondary | ICD-10-CM

## 2013-12-12 DIAGNOSIS — G4700 Insomnia, unspecified: Secondary | ICD-10-CM

## 2013-12-12 LAB — LIPID PANEL
CHOLESTEROL: 165 mg/dL (ref 0–200)
HDL: 57.3 mg/dL (ref >39.00)
LDL Cholesterol: 93 mg/dL (ref 0–99)
NONHDL: 107.7
TRIGLYCERIDES: 73 mg/dL (ref 0.0–149.0)
Total CHOL/HDL Ratio: 3
VLDL: 14.6 mg/dL (ref 0.0–40.0)

## 2013-12-12 LAB — HEPATIC FUNCTION PANEL
ALBUMIN: 4.1 g/dL (ref 3.5–5.2)
ALK PHOS: 48 U/L (ref 39–117)
ALT: 20 U/L (ref 0–53)
AST: 26 U/L (ref 0–37)
Bilirubin, Direct: 0.1 mg/dL (ref 0.0–0.3)
Total Bilirubin: 0.8 mg/dL (ref 0.2–1.2)
Total Protein: 6.6 g/dL (ref 6.0–8.3)

## 2013-12-12 MED ORDER — VITAMIN D 1000 UNITS PO TABS: 1000.0000 [IU] | ORAL_TABLET | Freq: Every day | ORAL | Status: AC

## 2013-12-12 NOTE — Progress Notes (Signed)
Pre visit review using our clinic review tool, if applicable. No additional management support is needed unless otherwise documented below in the visit note. 

## 2013-12-12 NOTE — Progress Notes (Signed)
Subjective:    HPI  Younger bother had an MI  Mr Harkins saw his brother's cardiologist: started Pravachol, stopped Lovastatin. Sleep test was ordered. Depression was discussed  ECHO was nl   The patient has been doing well overall without major physical or psychological issues going on lately.     Review of Systems  Constitutional: Negative for appetite change, fatigue and unexpected weight change.  HENT: Negative for congestion, ear discharge, nosebleeds, sneezing, sore throat and trouble swallowing.   Eyes: Negative for itching and visual disturbance.  Respiratory: Negative for cough.   Cardiovascular: Negative for chest pain, palpitations and leg swelling.  Gastrointestinal: Negative for nausea, vomiting, diarrhea, blood in stool and abdominal distention.  Genitourinary: Negative for frequency and hematuria.  Musculoskeletal: Negative for back pain, gait problem, joint swelling and neck pain.  Skin: Negative for rash.  Neurological: Negative for dizziness, tremors, speech difficulty and weakness.  Hematological: Does not bruise/bleed easily.  Psychiatric/Behavioral: Negative for suicidal ideas, confusion, sleep disturbance, dysphoric mood and agitation. The patient is not nervous/anxious.    BP Readings from Last 3 Encounters:  12/12/13 116/62  10/16/13 126/79  07/01/12 113/76   Wt Readings from Last 3 Encounters:  12/12/13 163 lb (73.936 kg)  07/01/12 165 lb (74.844 kg)  06/17/12 165 lb (74.844 kg)        Objective:   Physical Exam  Constitutional: He is oriented to person, place, and time. He appears well-developed and well-nourished. No distress.  HENT:  Head: Normocephalic and atraumatic.  Right Ear: External ear normal.  Left Ear: External ear normal.  Nose: Nose normal.  Mouth/Throat: Oropharynx is clear and moist. No oropharyngeal exudate.  Eyes: Conjunctivae and EOM are normal. Pupils are equal, round, and reactive to light. Right eye exhibits no  discharge. Left eye exhibits no discharge. No scleral icterus.  Neck: Normal range of motion. Neck supple. No JVD present. No tracheal deviation present. No thyromegaly present.  Cardiovascular: Normal rate, regular rhythm, normal heart sounds and intact distal pulses.  Exam reveals no gallop and no friction rub.   No murmur heard. Pulmonary/Chest: Effort normal and breath sounds normal. No stridor. No respiratory distress. He has no wheezes. He has no rales. He exhibits no tenderness.  Abdominal: Soft. Bowel sounds are normal. He exhibits no distension and no mass. There is no tenderness. There is no rebound and no guarding.  Genitourinary: Penis normal. Guaiac negative stool. No penile tenderness.  Rectal per GI - pending Peri -anal skin irritation is present  Musculoskeletal: Normal range of motion. He exhibits no edema and no tenderness.  Lymphadenopathy:    He has no cervical adenopathy.  Neurological: He is alert and oriented to person, place, and time. He has normal reflexes. No cranial nerve deficit. He exhibits normal muscle tone. Coordination normal.  Skin: Skin is warm and dry. Rash noted. He is not diaphoretic. No erythema. No pallor.  Psychiatric: He has a normal mood and affect. His behavior is normal. Judgment and thought content normal.    Lab Results  Component Value Date   WBC 4.6 06/12/2012   HGB 15.0 06/12/2012   HCT 45.6 06/12/2012   PLT 220.0 06/12/2012   GLUCOSE 93 06/12/2012   CHOL 210* 06/12/2012   TRIG 92.0 06/12/2012   HDL 52.70 06/12/2012   LDLDIRECT 125.2 06/12/2012   LDLCALC 96 05/23/2010   ALT 19 06/12/2012   AST 21 06/12/2012   NA 140 06/12/2012   K 4.5 06/12/2012   CL 105  06/12/2012   CREATININE 1.1 06/12/2012   BUN 15 06/12/2012   CO2 30 06/12/2012   TSH 1.55 06/12/2012   PSA 1.29 06/12/2012         Assessment & Plan:

## 2013-12-12 NOTE — Assessment & Plan Note (Signed)
Labs

## 2013-12-18 NOTE — Assessment & Plan Note (Signed)
Sleep test is pending Zolpidem prn

## 2013-12-23 ENCOUNTER — Encounter: Payer: Self-pay | Admitting: Internal Medicine

## 2014-06-28 ENCOUNTER — Emergency Department (HOSPITAL_COMMUNITY)
Admission: EM | Admit: 2014-06-28 | Discharge: 2014-06-28 | Disposition: A | Discharge status: Home / Self Care | Payer: Medicare Other | Source: Home / Self Care | Attending: Family Medicine | Admitting: Family Medicine

## 2014-06-28 ENCOUNTER — Encounter (HOSPITAL_COMMUNITY): Payer: Self-pay

## 2014-06-28 DIAGNOSIS — J069 Acute upper respiratory infection, unspecified: Secondary | ICD-10-CM

## 2014-06-28 MED ORDER — CHLORPHENIRAMINE-PSE-IBUPROFEN 2-30-200 MG PO TABS: ORAL_TABLET | ORAL | Status: DC

## 2014-06-28 MED ORDER — METHYLPREDNISOLONE 4 MG PO KIT: PACK | ORAL | Status: DC

## 2014-06-28 NOTE — Discharge Instructions (Signed)
Return for reevaluation in one week if you're not getting better. I recommend taking Advil Allergy Sinus in addition to the other medications you are taking in the prescription  Upper Respiratory Infection, Adult An upper respiratory infection (URI) is also sometimes known as the common cold. The upper respiratory tract includes the nose, sinuses, throat, trachea, and bronchi. Bronchi are the airways leading to the lungs. Most people improve within 1 week, but symptoms can last up to 2 weeks. A residual cough may last even longer.  CAUSES Many different viruses can infect the tissues lining the upper respiratory tract. The tissues become irritated and inflamed and often become very moist. Mucus production is also common. A cold is contagious. You can easily spread the virus to others by oral contact. This includes kissing, sharing a glass, coughing, or sneezing. Touching your mouth or nose and then touching a surface, which is then touched by another person, can also spread the virus. SYMPTOMS  Symptoms typically develop 1 to 3 days after you come in contact with a cold virus. Symptoms vary from person to person. They may include:  Runny nose.  Sneezing.  Nasal congestion.  Sinus irritation.  Sore throat.  Loss of voice (laryngitis).  Cough.  Fatigue.  Muscle aches.  Loss of appetite.  Headache.  Low-grade fever. DIAGNOSIS  You might diagnose your own cold based on familiar symptoms, since most people get a cold 2 to 3 times a year. Your caregiver can confirm this based on your exam. Most importantly, your caregiver can check that your symptoms are not due to another disease such as strep throat, sinusitis, pneumonia, asthma, or epiglottitis. Blood tests, throat tests, and X-rays are not necessary to diagnose a common cold, but they may sometimes be helpful in excluding other more serious diseases. Your caregiver will decide if any further tests are required. RISKS AND COMPLICATIONS   You may be at risk for a more severe case of the common cold if you smoke cigarettes, have chronic heart disease (such as heart failure) or lung disease (such as asthma), or if you have a weakened immune system. The very young and very old are also at risk for more serious infections. Bacterial sinusitis, middle ear infections, and bacterial pneumonia can complicate the common cold. The common cold can worsen asthma and chronic obstructive pulmonary disease (COPD). Sometimes, these complications can require emergency medical care and may be life-threatening. PREVENTION  The best way to protect against getting a cold is to practice good hygiene. Avoid oral or hand contact with people with cold symptoms. Wash your hands often if contact occurs. There is no clear evidence that vitamin C, vitamin E, echinacea, or exercise reduces the chance of developing a cold. However, it is always recommended to get plenty of rest and practice good nutrition. TREATMENT  Treatment is directed at relieving symptoms. There is no cure. Antibiotics are not effective, because the infection is caused by a virus, not by bacteria. Treatment may include:  Increased fluid intake. Sports drinks offer valuable electrolytes, sugars, and fluids.  Breathing heated mist or steam (vaporizer or shower).  Eating chicken soup or other clear broths, and maintaining good nutrition.  Getting plenty of rest.  Using gargles or lozenges for comfort.  Controlling fevers with ibuprofen or acetaminophen as directed by your caregiver.  Increasing usage of your inhaler if you have asthma. Zinc gel and zinc lozenges, taken in the first 24 hours of the common cold, can shorten the duration and lessen  the severity of symptoms. Pain medicines may help with fever, muscle aches, and throat pain. A variety of non-prescription medicines are available to treat congestion and runny nose. Your caregiver can make recommendations and may suggest nasal or  lung inhalers for other symptoms.  HOME CARE INSTRUCTIONS   Only take over-the-counter or prescription medicines for pain, discomfort, or fever as directed by your caregiver.  Use a warm mist humidifier or inhale steam from a shower to increase air moisture. This may keep secretions moist and make it easier to breathe.  Drink enough water and fluids to keep your urine clear or pale yellow.  Rest as needed.  Return to work when your temperature has returned to normal or as your caregiver advises. You may need to stay home longer to avoid infecting others. You can also use a face mask and careful hand washing to prevent spread of the virus. SEEK MEDICAL CARE IF:   After the first few days, you feel you are getting worse rather than better.  You need your caregiver's advice about medicines to control symptoms.  You develop chills, worsening shortness of breath, or brown or red sputum. These may be signs of pneumonia.  You develop yellow or brown nasal discharge or pain in the face, especially when you bend forward. These may be signs of sinusitis.  You develop a fever, swollen neck glands, pain with swallowing, or white areas in the back of your throat. These may be signs of strep throat. SEEK IMMEDIATE MEDICAL CARE IF:   You have a fever.  You develop severe or persistent headache, ear pain, sinus pain, or chest pain.  You develop wheezing, a prolonged cough, cough up blood, or have a change in your usual mucus (if you have chronic lung disease).  You develop sore muscles or a stiff neck. Document Released: 10/04/2000 Document Revised: 07/03/2011 Document Reviewed: 07/16/2013 [facility] Patient Information 2015 [place], [place]. This information is not intended to replace advice given to you by your health care provider. Make sure you discuss any questions you have with your health care provider.

## 2014-06-28 NOTE — ED Notes (Signed)
Patient complains  of cough congestion and headache that started about four days ago

## 2014-06-28 NOTE — ED Provider Notes (Signed)
CSN: [id number]     Arrival date & time 06/28/14  1102 History   First MD Initiated Contact with Patient 06/28/14 1229     Chief Complaint  Patient presents with  . Facial Pain   (Consider location/radiation/quality/duration/timing/severity/associated sxs/prior Treatment) HPI           69 year old male presents complaining of a sinus infection. He has congestion, rhinorrhea, cough, sore throat for 4 days. Symptoms are mild to moderate. He is taking over-the-counter medications with some relief of his symptoms. He denies any severe sinus pain or pressure, fever, chills, NVD, chest pain, shortness of breath. No recent travel or sick contacts.  Past Medical History  Diagnosis Date  . Hyperlipidemia   . S/P dilatation of esophageal stricture   . Hematoma of leg   . Allergy     Food/cat dander/bee stings   Past Surgical History  Procedure Laterality Date  . Cataract extraction w/ intraocular lens  implant, bilateral  2010   Family History  Problem Relation Age of Onset  . Heart disease Father     CABG at 33 yo  . Prostate cancer Paternal Uncle   . Coronary artery disease Other   . Heart disease Brother    History  Substance Use Topics  . Smoking status: Never Smoker   . Smokeless tobacco: Never Used  . Alcohol Use: 1.2 oz/week    2 Glasses of wine per week    Review of Systems  Constitutional: Negative for fever and chills.  HENT: Positive for congestion, rhinorrhea and sore throat. Negative for ear pain and sinus pressure.   Respiratory: Positive for cough. Negative for chest tightness and shortness of breath.   Cardiovascular: Negative for chest pain.  Neurological: Positive for headaches.  All other systems reviewed and are negative.   Allergies  Bee venom  Home Medications   Prior to Admission medications   Medication Sig Start Date End Date Taking? Authorizing Provider  aspirin 81 MG tablet Take 81 mg by mouth every other day.     Historical Provider, MD   Chlorpheniramine-PSE-Ibuprofen (ADVIL ALLERGY SINUS) 2-30-200 MG TABS 1-2 tabs PO Q4-6 hrs PRN 06/28/14   Liam Graham, PA-C  cholecalciferol (VITAMIN D) 1000 UNITS tablet Take 1 tablet (1,000 Units total) by mouth daily. 12/12/13 12/12/14  Aleksei Plotnikov V, MD  EPINEPHrine (EPIPEN 2-PAK) 0.3 mg/0.3 mL DEVI Inject 0.3 mg into the muscle as directed.      Historical Provider, MD  hydrocortisone cream 1 % Apply topically 2 (two) times daily. 06/09/12   Harden Mo, MD  methylPREDNISolone (MEDROL DOSEPAK) 4 MG tablet Use as directed on package instructions 06/28/14   Liam Graham, PA-C  nystatin cream (MYCOSTATIN) Apply topically 2 (two) times daily. 06/09/12   Harden Mo, MD  pravastatin (PRAVACHOL) 40 MG tablet Take 1 tablet (40 mg total) by mouth daily. 12/01/13   Aleksei Plotnikov V, MD  zolpidem (AMBIEN) 10 MG tablet Take 1 tablet (10 mg total) by mouth at bedtime as needed. 03/08/11   Aleksei Plotnikov V, MD   BP 122/86 mmHg  Pulse 77  Temp(Src) 98.3 F (36.8 C) (Oral)  Resp 18  SpO2 96% Physical Exam  Constitutional: He is oriented to person, place, and time. He appears well-developed and well-nourished. No distress.  HENT:  Head: Normocephalic and atraumatic.  Right Ear: External ear normal.  Left Ear: External ear normal.  Nose: Nose normal. Right sinus exhibits no maxillary sinus tenderness and no frontal sinus tenderness.  Left sinus exhibits no maxillary sinus tenderness and no frontal sinus tenderness.  Mouth/Throat: Oropharynx is clear and moist. No oropharyngeal exudate.  Eyes: Conjunctivae are normal.  Neck: Normal range of motion. Neck supple.  Cardiovascular: Normal rate, regular rhythm, normal heart sounds and intact distal pulses.   Pulmonary/Chest: Effort normal and breath sounds normal. No respiratory distress. He has no wheezes. He has no rales.  Lymphadenopathy:    He has no cervical adenopathy.  Neurological: He is alert and oriented to person, place, and  time. Coordination normal.  Skin: Skin is warm and dry. No rash noted. He is not diaphoretic.  Psychiatric: He has a normal mood and affect. Judgment normal.  Nursing note and vitals reviewed.   ED Course  Procedures (including critical care time) Labs Review Labs Reviewed - No data to display  Imaging Review No results found.   MDM   1. URI (upper respiratory infection)    Most consistent with a viral URI, no signs of sinusitis. Continue to treat symptomatically. Follow-up when necessary  Meds ordered this encounter  Medications  . methylPREDNISolone (MEDROL DOSEPAK) 4 MG tablet    Sig: Use as directed on package instructions    Dispense:  21 tablet    Refill:  0    Order Specific Question:  Supervising Provider    Answer:  Billy Fischer [phone]  . Chlorpheniramine-PSE-Ibuprofen (ADVIL ALLERGY SINUS) 2-30-200 MG TABS    Sig: 1-2 tabs PO Q4-6 hrs PRN    Dispense:  30 each    Refill:  1    Order Specific Question:  Supervising Provider    Answer:  Ihor Gully D [place] Vasilis Luhman, PA-C 06/28/14 [phone]

## 2014-07-01 ENCOUNTER — Emergency Department (HOSPITAL_COMMUNITY)
Admission: EM | Admit: 2014-07-01 | Discharge: 2014-07-01 | Disposition: A | Discharge status: Home / Self Care | Payer: Medicare Other | Source: Home / Self Care | Attending: Family Medicine | Admitting: Family Medicine

## 2014-07-01 ENCOUNTER — Encounter (HOSPITAL_COMMUNITY): Payer: Self-pay | Admitting: *Deleted

## 2014-07-01 DIAGNOSIS — J069 Acute upper respiratory infection, unspecified: Secondary | ICD-10-CM | POA: Diagnosis not present

## 2014-07-01 NOTE — ED Notes (Signed)
Pt  reports   Congestion  /  couging          Stuffy nose         X  1  Week        Seen  3  Days  Ago     And  Was  rx  meds  Taking            Still  Having   Having  Symptoms

## 2014-07-01 NOTE — Discharge Instructions (Signed)
Upper Respiratory Infection, Adult Recommend adding the use of saline nasal spray frequently. Drink plenty of fluids and stay well-hydrated. Continue your medication.  An upper respiratory infection (URI) is also sometimes known as the common cold. The upper respiratory tract includes the nose, sinuses, throat, trachea, and bronchi. Bronchi are the airways leading to the lungs. Most people improve within 1 week, but symptoms can last up to 2 weeks. A residual cough may last even longer.  CAUSES Many different viruses can infect the tissues lining the upper respiratory tract. The tissues become irritated and inflamed and often become very moist. Mucus production is also common. A cold is contagious. You can easily spread the virus to others by oral contact. This includes kissing, sharing a glass, coughing, or sneezing. Touching your mouth or nose and then touching a surface, which is then touched by another person, can also spread the virus. SYMPTOMS  Symptoms typically develop 1 to 3 days after you come in contact with a cold virus. Symptoms vary from person to person. They may include:  Runny nose.  Sneezing.  Nasal congestion.  Sinus irritation.  Sore throat.  Loss of voice (laryngitis).  Cough.  Fatigue.  Muscle aches.  Loss of appetite.  Headache.  Low-grade fever. DIAGNOSIS  You might diagnose your own cold based on familiar symptoms, since most people get a cold 2 to 3 times a year. Your caregiver can confirm this based on your exam. Most importantly, your caregiver can check that your symptoms are not due to another disease such as strep throat, sinusitis, pneumonia, asthma, or epiglottitis. Blood tests, throat tests, and X-rays are not necessary to diagnose a common cold, but they may sometimes be helpful in excluding other more serious diseases. Your caregiver will decide if any further tests are required. RISKS AND COMPLICATIONS  You may be at risk for a more severe case  of the common cold if you smoke cigarettes, have chronic heart disease (such as heart failure) or lung disease (such as asthma), or if you have a weakened immune system. The very young and very old are also at risk for more serious infections. Bacterial sinusitis, middle ear infections, and bacterial pneumonia can complicate the common cold. The common cold can worsen asthma and chronic obstructive pulmonary disease (COPD). Sometimes, these complications can require emergency medical care and may be life-threatening. PREVENTION  The best way to protect against getting a cold is to practice good hygiene. Avoid oral or hand contact with people with cold symptoms. Wash your hands often if contact occurs. There is no clear evidence that vitamin C, vitamin E, echinacea, or exercise reduces the chance of developing a cold. However, it is always recommended to get plenty of rest and practice good nutrition. TREATMENT  Treatment is directed at relieving symptoms. There is no cure. Antibiotics are not effective, because the infection is caused by a virus, not by bacteria. Treatment may include:  Increased fluid intake. Sports drinks offer valuable electrolytes, sugars, and fluids.  Breathing heated mist or steam (vaporizer or shower).  Eating chicken soup or other clear broths, and maintaining good nutrition.  Getting plenty of rest.  Using gargles or lozenges for comfort.  Controlling fevers with ibuprofen or acetaminophen as directed by your caregiver.  Increasing usage of your inhaler if you have asthma. Zinc gel and zinc lozenges, taken in the first 24 hours of the common cold, can shorten the duration and lessen the severity of symptoms. Pain medicines may help with fever,  muscle aches, and throat pain. A variety of non-prescription medicines are available to treat congestion and runny nose. Your caregiver can make recommendations and may suggest nasal or lung inhalers for other symptoms.  HOME CARE  INSTRUCTIONS   Only take over-the-counter or prescription medicines for pain, discomfort, or fever as directed by your caregiver.  Use a warm mist humidifier or inhale steam from a shower to increase air moisture. This may keep secretions moist and make it easier to breathe.  Drink enough water and fluids to keep your urine clear or pale yellow.  Rest as needed.  Return to work when your temperature has returned to normal or as your caregiver advises. You may need to stay home longer to avoid infecting others. You can also use a face mask and careful hand washing to prevent spread of the virus. SEEK MEDICAL CARE IF:   After the first few days, you feel you are getting worse rather than better.  You need your caregiver's advice about medicines to control symptoms.  You develop chills, worsening shortness of breath, or brown or red sputum. These may be signs of pneumonia.  You develop yellow or brown nasal discharge or pain in the face, especially when you bend forward. These may be signs of sinusitis.  You develop a fever, swollen neck glands, pain with swallowing, or white areas in the back of your throat. These may be signs of strep throat. SEEK IMMEDIATE MEDICAL CARE IF:   You have a fever.  You develop severe or persistent headache, ear pain, sinus pain, or chest pain.  You develop wheezing, a prolonged cough, cough up blood, or have a change in your usual mucus (if you have chronic lung disease).  You develop sore muscles or a stiff neck. Document Released: 10/04/2000 Document Revised: 07/03/2011 Document Reviewed: 07/16/2013 [facility] Patient Information 2015 [place], [place]. This information is not intended to replace advice given to you by your health care provider. Make sure you discuss any questions you have with your health care provider.

## 2014-07-01 NOTE — ED Provider Notes (Signed)
CSN: [id number]     Arrival date & time 07/01/14  1122 History   First MD Initiated Contact with Patient 07/01/14 1228     Chief Complaint  Patient presents with  . URI   (Consider location/radiation/quality/duration/timing/severity/associated sxs/prior Treatment) HPI Comments: 69 year old male presents with URI symptoms. He was seen here 3 days ago with the same symptoms and treated with OTC medicines to contain antihistamine, decongestant and ibuprofen. He was also administered a Medrol Dosepak. He states his drainage is a little bit better, he has no fever that he was told that if he was not much better to return today. He was unaware that URI symptoms can last 10 days or longer.  Patient is a 69 y.o. male presenting with URI.  URI Presenting symptoms: congestion, cough, rhinorrhea and sore throat   Presenting symptoms: no ear pain, no fatigue and no fever   Associated symptoms: no neck pain     Past Medical History  Diagnosis Date  . Hyperlipidemia   . S/P dilatation of esophageal stricture   . Hematoma of leg   . Allergy     Food/cat dander/bee stings   Past Surgical History  Procedure Laterality Date  . Cataract extraction w/ intraocular lens  implant, bilateral  2010   Family History  Problem Relation Age of Onset  . Heart disease Father     CABG at 29 yo  . Prostate cancer Paternal Uncle   . Coronary artery disease Other   . Heart disease Brother    History  Substance Use Topics  . Smoking status: Never Smoker   . Smokeless tobacco: Never Used  . Alcohol Use: 1.2 oz/week    2 Glasses of wine per week    Review of Systems  Constitutional: Negative for fever, diaphoresis, activity change and fatigue.  HENT: Positive for congestion, postnasal drip, rhinorrhea and sore throat. Negative for ear pain, facial swelling and trouble swallowing.   Eyes: Negative for pain, discharge and redness.  Respiratory: Positive for cough. Negative for chest tightness and shortness  of breath.   Cardiovascular: Negative.   Gastrointestinal: Negative.   Musculoskeletal: Negative.  Negative for neck pain and neck stiffness.  Neurological: Negative.     Allergies  Bee venom  Home Medications   Prior to Admission medications   Medication Sig Start Date End Date Taking? Authorizing Provider  aspirin 81 MG tablet Take 81 mg by mouth every other day.     Historical Provider, MD  Chlorpheniramine-PSE-Ibuprofen (ADVIL ALLERGY SINUS) 2-30-200 MG TABS 1-2 tabs PO Q4-6 hrs PRN 06/28/14   Liam Graham, PA-C  cholecalciferol (VITAMIN D) 1000 UNITS tablet Take 1 tablet (1,000 Units total) by mouth daily. 12/12/13 12/12/14  Aleksei Plotnikov V, MD  EPINEPHrine (EPIPEN 2-PAK) 0.3 mg/0.3 mL DEVI Inject 0.3 mg into the muscle as directed.      Historical Provider, MD  hydrocortisone cream 1 % Apply topically 2 (two) times daily. 06/09/12   Harden Mo, MD  methylPREDNISolone (MEDROL DOSEPAK) 4 MG tablet Use as directed on package instructions 06/28/14   Liam Graham, PA-C  nystatin cream (MYCOSTATIN) Apply topically 2 (two) times daily. 06/09/12   Harden Mo, MD  pravastatin (PRAVACHOL) 40 MG tablet Take 1 tablet (40 mg total) by mouth daily. 12/01/13   Aleksei Plotnikov V, MD  zolpidem (AMBIEN) 10 MG tablet Take 1 tablet (10 mg total) by mouth at bedtime as needed. 03/08/11   Aleksei Plotnikov V, MD   There were no  vitals taken for this visit. Physical Exam  Constitutional: He is oriented to person, place, and time. He appears well-developed and well-nourished. No distress.  HENT:  Mouth/Throat: No oropharyngeal exudate.  Bilateral TMs are normal Oropharynx with minor streaky erythema, cobblestoning and scant clear PND.  Eyes: Conjunctivae and EOM are normal.  Neck: Normal range of motion. Neck supple.  Cardiovascular: Normal rate, regular rhythm and normal heart sounds.   Pulmonary/Chest: Effort normal and breath sounds normal. No respiratory distress. He has no wheezes. He  has no rales.  Musculoskeletal: Normal range of motion. He exhibits no edema.  Lymphadenopathy:    He has no cervical adenopathy.  Neurological: He is alert and oriented to person, place, and time.  Skin: Skin is warm and dry. No rash noted.  Psychiatric: He has a normal mood and affect.  Nursing note and vitals reviewed.   ED Course  Procedures (including critical care time) Labs Review Labs Reviewed - No data to display  Imaging Review No results found.   MDM   1. URI (upper respiratory infection)    Recommend adding the use of saline nasal spray frequently. Drink plenty of fluids and stay well-hydrated. Continue your medication.  Today's exam unremarkable for any new symptoms or problems. The patient remains afebrile. Reassurance. Patient advised that URI symptoms off and last 10 days or longer.    Janne Napoleon, NP 07/01/14 1242

## 2015-01-14 ENCOUNTER — Other Ambulatory Visit: Payer: Self-pay | Admitting: *Deleted

## 2015-01-14 ENCOUNTER — Telehealth: Payer: Self-pay | Admitting: Internal Medicine

## 2015-01-14 MED ORDER — PRAVASTATIN SODIUM 40 MG PO TABS: 40.0000 mg | ORAL_TABLET | Freq: Every day | ORAL | Status: DC

## 2015-01-14 NOTE — Telephone Encounter (Signed)
Left patient vm to schedule a medication fu per Marzetta Board.

## 2015-01-25 ENCOUNTER — Ambulatory Visit (INDEPENDENT_AMBULATORY_CARE_PROVIDER_SITE_OTHER): Payer: Medicare Other | Admitting: Internal Medicine

## 2015-01-25 ENCOUNTER — Encounter: Payer: Self-pay | Admitting: Internal Medicine

## 2015-01-25 VITALS — BP 130/88 | HR 74 | Ht 67.5 in | Wt 169.0 lb

## 2015-01-25 DIAGNOSIS — Z658 Other specified problems related to psychosocial circumstances: Secondary | ICD-10-CM | POA: Diagnosis not present

## 2015-01-25 DIAGNOSIS — Z23 Encounter for immunization: Secondary | ICD-10-CM | POA: Diagnosis not present

## 2015-01-25 DIAGNOSIS — Z0000 Encounter for general adult medical examination without abnormal findings: Secondary | ICD-10-CM

## 2015-01-25 DIAGNOSIS — I2583 Coronary atherosclerosis due to lipid rich plaque: Secondary | ICD-10-CM

## 2015-01-25 DIAGNOSIS — D485 Neoplasm of uncertain behavior of skin: Secondary | ICD-10-CM

## 2015-01-25 DIAGNOSIS — F439 Reaction to severe stress, unspecified: Secondary | ICD-10-CM

## 2015-01-25 DIAGNOSIS — I2510 Atherosclerotic heart disease of native coronary artery without angina pectoris: Secondary | ICD-10-CM | POA: Insufficient documentation

## 2015-01-25 DIAGNOSIS — Z9989 Dependence on other enabling machines and devices: Secondary | ICD-10-CM

## 2015-01-25 DIAGNOSIS — G4733 Obstructive sleep apnea (adult) (pediatric): Secondary | ICD-10-CM | POA: Diagnosis not present

## 2015-01-25 MED ORDER — VITAMIN D 1000 UNITS PO TABS: 1000.0000 [IU] | ORAL_TABLET | Freq: Every day | ORAL | Status: AC

## 2015-01-25 NOTE — Assessment & Plan Note (Signed)
Here for medicare wellness/physical  Diet: heart healthy  Physical activity: not sedentary  Depression/mood screen: seems depressed a little Hearing: intact to whispered voice  Visual acuity: grossly normal, performs annual eye exam  ADLs: capable  Fall risk: none  Home safety: good  Cognitive evaluation: intact to orientation, naming, recall and repetition  EOL planning: adv directives, full code/ I agree  I have personally reviewed and have noted  1. The patient's medical, surgical and social history  2. Their use of alcohol, tobacco or illicit drugs  3. Their current medications and supplements  4. The patient's functional ability including ADL's, fall risks, home safety risks and hearing or visual impairment.  5. Diet and physical activities  6. Evidence for depression or mood disorders 7. The roster of all physicians providing medical care to patient - is listed in the Snapshot section of the chart and reviewed today.    Today patient counseled on age appropriate routine health concerns for screening and prevention, each reviewed and up to date or declined. Immunizations reviewed and up to date or declined. Labs ordered and reviewed. Risk factors for depression reviewed and negative. Hearing function and visual acuity are intact. ADLs screened and addressed as needed. Functional ability and level of safety reviewed and appropriate. Education, counseling and referrals performed based on assessed risks today. Patient provided with a copy of personalized plan for preventive services.

## 2015-01-25 NOTE — Progress Notes (Signed)
Subjective:  Patient ID: Alan Wall, male    DOB: 1945/11/05  Age: 69 y.o. MRN: [id number]  CC: No chief complaint on file.   HPI Alan Wall presents for a well exam. C/o stress  Outpatient Prescriptions Prior to Visit  Medication Sig Dispense Refill  . aspirin 81 MG tablet Take 81 mg by mouth every other day.     . Chlorpheniramine-PSE-Ibuprofen (ADVIL ALLERGY SINUS) 2-30-200 MG TABS 1-2 tabs PO Q4-6 hrs PRN 30 each 1  . pravastatin (PRAVACHOL) 40 MG tablet Take 1 tablet (40 mg total) by mouth daily. 30 tablet 0  . EPINEPHrine (EPIPEN 2-PAK) 0.3 mg/0.3 mL DEVI Inject 0.3 mg into the muscle as directed.      . hydrocortisone cream 1 % Apply topically 2 (two) times daily. (Patient not taking: Reported on 01/25/2015) 30 g 3  . methylPREDNISolone (MEDROL DOSEPAK) 4 MG tablet Use as directed on package instructions (Patient not taking: Reported on 01/25/2015) 21 tablet 0  . nystatin cream (MYCOSTATIN) Apply topically 2 (two) times daily. (Patient not taking: Reported on 01/25/2015) 30 g 3  . zolpidem (AMBIEN) 10 MG tablet Take 1 tablet (10 mg total) by mouth at bedtime as needed. (Patient not taking: Reported on 01/25/2015) 30 tablet 3   No facility-administered medications prior to visit.    ROS Review of Systems  Constitutional: Negative for appetite change, fatigue and unexpected weight change.  HENT: Negative for congestion, nosebleeds, sneezing, sore throat and trouble swallowing.   Eyes: Negative for itching and visual disturbance.  Respiratory: Negative for cough.   Cardiovascular: Negative for chest pain, palpitations and leg swelling.  Gastrointestinal: Negative for nausea, diarrhea, blood in stool and abdominal distention.  Genitourinary: Negative for frequency and hematuria.  Musculoskeletal: Positive for arthralgias. Negative for back pain, joint swelling, gait problem and neck pain.  Skin: Negative for rash.  Neurological: Negative for dizziness, tremors, speech  difficulty and weakness.  Psychiatric/Behavioral: Positive for decreased concentration. Negative for suicidal ideas, sleep disturbance, dysphoric mood and agitation. The patient is nervous/anxious.     Objective:  BP 130/88 mmHg  Pulse 74  Ht 5' 7.5" (1.715 m)  Wt 169 lb (76.658 kg)  BMI 26.06 kg/m2  SpO2 95%  BP Readings from Last 3 Encounters:  01/25/15 130/88  07/01/14 119/76  06/28/14 122/86    Wt Readings from Last 3 Encounters:  01/25/15 169 lb (76.658 kg)  12/12/13 163 lb (73.936 kg)  07/01/12 165 lb (74.844 kg)    Physical Exam  Constitutional: He is oriented to person, place, and time. He appears well-developed and well-nourished. No distress.  HENT:  Head: Normocephalic and atraumatic.  Right Ear: External ear normal.  Left Ear: External ear normal.  Nose: Nose normal.  Mouth/Throat: Oropharynx is clear and moist. No oropharyngeal exudate.  Eyes: Conjunctivae and EOM are normal. Pupils are equal, round, and reactive to light. Right eye exhibits no discharge. Left eye exhibits no discharge. No scleral icterus.  Neck: Normal range of motion. Neck supple. No JVD present. No tracheal deviation present. No thyromegaly present.  Cardiovascular: Normal rate, regular rhythm, normal heart sounds and intact distal pulses.  Exam reveals no gallop and no friction rub.   No murmur heard. Pulmonary/Chest: Effort normal and breath sounds normal. No stridor. No respiratory distress. He has no wheezes. He has no rales. He exhibits no tenderness.  Abdominal: Soft. Bowel sounds are normal. He exhibits no distension and no mass. There is no tenderness. There is no rebound  and no guarding.  Genitourinary: Rectum normal, prostate normal and penis normal. Guaiac negative stool. No penile tenderness.  Musculoskeletal: Normal range of motion. He exhibits no edema or tenderness.  Lymphadenopathy:    He has no cervical adenopathy.  Neurological: He is alert and oriented to person, place, and  time. He has normal reflexes. No cranial nerve deficit. He exhibits normal muscle tone. Coordination normal.  Skin: Skin is warm and dry. No rash noted. He is not diaphoretic. No erythema. No pallor.  Psychiatric: His behavior is normal. Judgment and thought content normal.  L breast mole 6x3 mm irreg  Lab Results  Component Value Date   WBC 6.3 01/26/2015   HGB 14.9 01/26/2015   HCT 45.8 01/26/2015   PLT 216.0 01/26/2015   GLUCOSE 107* 01/26/2015   CHOL 147 01/26/2015   TRIG 64.0 01/26/2015   HDL 58.40 01/26/2015   LDLDIRECT 125.2 06/12/2012   LDLCALC 76 01/26/2015   ALT 21 01/26/2015   AST 23 01/26/2015   NA 141 01/26/2015   K 4.5 01/26/2015   CL 106 01/26/2015   CREATININE 1.03 01/26/2015   BUN 15 01/26/2015   CO2 27 01/26/2015   TSH 0.93 01/26/2015   PSA 1.53 01/26/2015    No results found.  Assessment & Plan:   Diagnoses and all orders for this visit:  Well adult exam -     Basic metabolic panel; Future -     CBC with Differential/Platelet; Future -     Hepatic function panel; Future -     Lipid panel; Future -     PSA; Future -     TSH; Future -     Urinalysis; Future -     Hepatitis C Antibody; Future  Stress -     Ambulatory referral to Psychology -     Basic metabolic panel; Future -     CBC with Differential/Platelet; Future -     Hepatic function panel; Future -     Lipid panel; Future -     PSA; Future -     TSH; Future -     Urinalysis; Future  OSA on CPAP -     Basic metabolic panel; Future -     CBC with Differential/Platelet; Future -     Hepatic function panel; Future -     Lipid panel; Future -     PSA; Future -     TSH; Future -     Urinalysis; Future  Coronary artery disease due to lipid rich plaque -     Basic metabolic panel; Future -     CBC with Differential/Platelet; Future -     Hepatic function panel; Future -     Lipid panel; Future -     PSA; Future -     TSH; Future -     Urinalysis; Future  Need for influenza  vaccination -     Flu Vaccine QUAD 36+ mos IM  Neoplasm of uncertain behavior of skin  Other orders -     cholecalciferol (VITAMIN D) 1000 UNITS tablet; Take 1 tablet (1,000 Units total) by mouth daily.     Meds ordered this encounter  Medications  . fluticasone (FLONASE) 50 MCG/ACT nasal spray    Sig:     Refill:  4  . cholecalciferol (VITAMIN D) 1000 UNITS tablet    Sig: Take 1 tablet (1,000 Units total) by mouth daily.    Dispense:  100 tablet    Refill:  3     Follow-up: Return in about 1 year (around 01/25/2016) for Wellness Exam.  Walker Kehr, MD

## 2015-01-25 NOTE — Assessment & Plan Note (Signed)
Dr Manuela Schwartz in [place] [facility], Pravachol

## 2015-01-25 NOTE — Assessment & Plan Note (Signed)
On CPAP. ?

## 2015-01-25 NOTE — Patient Instructions (Signed)
Preventive Care for Adults A healthy lifestyle and preventive care can promote health and wellness. Preventive health guidelines for men include the following key practices:  A routine yearly physical is a good way to check with your health care provider about your health and preventative screening. It is a chance to share any concerns and updates on your health and to receive a thorough exam.  Visit your dentist for a routine exam and preventative care every 6 months. Brush your teeth twice a day and floss once a day. Good oral hygiene prevents tooth decay and gum disease.  The frequency of eye exams is based on your age, health, family medical history, use of contact lenses, and other factors. Follow your health care provider's recommendations for frequency of eye exams.  Eat a healthy diet. Foods such as vegetables, fruits, whole grains, low-fat dairy products, and lean protein foods contain the nutrients you need without too many calories. Decrease your intake of foods high in solid fats, added sugars, and salt. Eat the right amount of calories for you.Get information about a proper diet from your health care provider, if necessary.  Regular physical exercise is one of the most important things you can do for your health. Most adults should get at least 150 minutes of moderate-intensity exercise (any activity that increases your heart rate and causes you to sweat) each week. In addition, most adults need muscle-strengthening exercises on 2 or more days a week.  Maintain a healthy weight. The body mass index (BMI) is a screening tool to identify possible weight problems. It provides an estimate of body fat based on height and weight. Your health care provider can find your BMI and can help you achieve or maintain a healthy weight.For adults 20 years and older:  A BMI below 18.5 is considered underweight.  A BMI of 18.5 to 24.9 is normal.  A BMI of 25 to 29.9 is considered overweight.  A BMI  of 30 and above is considered obese.  Maintain normal blood lipids and cholesterol levels by exercising and minimizing your intake of saturated fat. Eat a balanced diet with plenty of fruit and vegetables. Blood tests for lipids and cholesterol should begin at age 50 and be repeated every 5 years. If your lipid or cholesterol levels are high, you are over 50, or you are at high risk for heart disease, you may need your cholesterol levels checked more frequently.Ongoing high lipid and cholesterol levels should be treated with medicines if diet and exercise are not working.  If you smoke, find out from your health care provider how to quit. If you do not use tobacco, do not start.  Lung cancer screening is recommended for adults aged 73-80 years who are at high risk for developing lung cancer because of a history of smoking. A yearly low-dose CT scan of the lungs is recommended for people who have at least a 30-pack-year history of smoking and are a current smoker or have quit within the past 15 years. A pack year of smoking is smoking an average of 1 pack of cigarettes a day for 1 year (for example: 1 pack a day for 30 years or 2 packs a day for 15 years). Yearly screening should continue until the smoker has stopped smoking for at least 15 years. Yearly screening should be stopped for people who develop a health problem that would prevent them from having lung cancer treatment.  If you choose to drink alcohol, do not have more than  2 drinks per day. One drink is considered to be 12 ounces (355 mL) of beer, 5 ounces (148 mL) of wine, or 1.5 ounces (44 mL) of liquor.  Avoid use of street drugs. Do not share needles with anyone. Ask for help if you need support or instructions about stopping the use of drugs.  High blood pressure causes heart disease and increases the risk of stroke. Your blood pressure should be checked at least every 1-2 years. Ongoing high blood pressure should be treated with  medicines, if weight loss and exercise are not effective.  If you are 45-79 years old, ask your health care provider if you should take aspirin to prevent heart disease.  Diabetes screening involves taking a blood sample to check your fasting blood sugar level. This should be done once every 3 years, after age 45, if you are within normal weight and without risk factors for diabetes. Testing should be considered at a younger age or be carried out more frequently if you are overweight and have at least 1 risk factor for diabetes.  Colorectal cancer can be detected and often prevented. Most routine colorectal cancer screening begins at the age of 50 and continues through age 75. However, your health care provider may recommend screening at an earlier age if you have risk factors for colon cancer. On a yearly basis, your health care provider may provide home test kits to check for hidden blood in the stool. Use of a small camera at the end of a tube to directly examine the colon (sigmoidoscopy or colonoscopy) can detect the earliest forms of colorectal cancer. Talk to your health care provider about this at age 50, when routine screening begins. Direct exam of the colon should be repeated every 5-10 years through age 75, unless early forms of precancerous polyps or small growths are found.  People who are at an increased risk for hepatitis B should be screened for this virus. You are considered at high risk for hepatitis B if:  You were born in a country where hepatitis B occurs often. Talk with your health care provider about which countries are considered high risk.  Your parents were born in a high-risk country and you have not received a shot to protect against hepatitis B (hepatitis B vaccine).  You have HIV or AIDS.  You use needles to inject street drugs.  You live with, or have sex with, someone who has hepatitis B.  You are a man who has sex with other men (MSM).  You get hemodialysis  treatment.  You take certain medicines for conditions such as cancer, organ transplantation, and autoimmune conditions.  Hepatitis C blood testing is recommended for all people born from 1945 through 1965 and any individual with known risks for hepatitis C.  Practice safe sex. Use condoms and avoid high-risk sexual practices to reduce the spread of sexually transmitted infections (STIs). STIs include gonorrhea, chlamydia, syphilis, trichomonas, herpes, HPV, and human immunodeficiency virus (HIV). Herpes, HIV, and HPV are viral illnesses that have no cure. They can result in disability, cancer, and death.  If you are at risk of being infected with HIV, it is recommended that you take a prescription medicine daily to prevent HIV infection. This is called preexposure prophylaxis (PrEP). You are considered at risk if:  You are a man who has sex with other men (MSM) and have other risk factors.  You are a heterosexual man, are sexually active, and are at increased risk for HIV infection.    You take drugs by injection.  You are sexually active with a partner who has HIV.  Talk with your health care provider about whether you are at high risk of being infected with HIV. If you choose to begin PrEP, you should first be tested for HIV. You should then be tested every 3 months for as long as you are taking PrEP.  A one-time screening for abdominal aortic aneurysm (AAA) and surgical repair of large AAAs by ultrasound are recommended for men ages 32 to 67 years who are current or former smokers.  Healthy men should no longer receive prostate-specific antigen (PSA) blood tests as part of routine cancer screening. Talk with your health care provider about prostate cancer screening.  Testicular cancer screening is not recommended for adult males who have no symptoms. Screening includes self-exam, a health care provider exam, and other screening tests. Consult with your health care provider about any symptoms  you have or any concerns you have about testicular cancer.  Use sunscreen. Apply sunscreen liberally and repeatedly throughout the day. You should seek shade when your shadow is shorter than you. Protect yourself by wearing long sleeves, pants, a wide-brimmed hat, and sunglasses year round, whenever you are outdoors.  Once a month, do a whole-body skin exam, using a mirror to look at the skin on your back. Tell your health care provider about new moles, moles that have irregular borders, moles that are larger than a pencil eraser, or moles that have changed in shape or color.  Stay current with required vaccines (immunizations).  Influenza vaccine. All adults should be immunized every year.  Tetanus, diphtheria, and acellular pertussis (Td, Tdap) vaccine. An adult who has not previously received Tdap or who does not know his vaccine status should receive 1 dose of Tdap. This initial dose should be followed by tetanus and diphtheria toxoids (Td) booster doses every 10 years. Adults with an unknown or incomplete history of completing a 3-dose immunization series with Td-containing vaccines should begin or complete a primary immunization series including a Tdap dose. Adults should receive a Td booster every 10 years.  Varicella vaccine. An adult without evidence of immunity to varicella should receive 2 doses or a second dose if he has previously received 1 dose.  Human papillomavirus (HPV) vaccine. Males aged 68-21 years who have not received the vaccine previously should receive the 3-dose series. Males aged 22-26 years may be immunized. Immunization is recommended through the age of 6 years for any male who has sex with males and did not get any or all doses earlier. Immunization is recommended for any person with an immunocompromised condition through the age of 49 years if he did not get any or all doses earlier. During the 3-dose series, the second dose should be obtained 4-8 weeks after the first  dose. The third dose should be obtained 24 weeks after the first dose and 16 weeks after the second dose.  Zoster vaccine. One dose is recommended for adults aged 50 years or older unless certain conditions are present.  Measles, mumps, and rubella (MMR) vaccine. Adults born before [birth year] generally are considered immune to measles and mumps. Adults born in [birth year] or later should have 1 or more doses of MMR vaccine unless there is a contraindication to the vaccine or there is laboratory evidence of immunity to each of the three diseases. A routine second dose of MMR vaccine should be obtained at least 28 days after the first dose for students attending postsecondary  schools, health care workers, or international travelers. People who received inactivated measles vaccine or an unknown type of measles vaccine during 1963-1967 should receive 2 doses of MMR vaccine. People who received inactivated mumps vaccine or an unknown type of mumps vaccine before 1979 and are at high risk for mumps infection should consider immunization with 2 doses of MMR vaccine. Unvaccinated health care workers born before 1957 who lack laboratory evidence of measles, mumps, or rubella immunity or laboratory confirmation of disease should consider measles and mumps immunization with 2 doses of MMR vaccine or rubella immunization with 1 dose of MMR vaccine.  Pneumococcal 13-valent conjugate (PCV13) vaccine. When indicated, a person who is uncertain of his immunization history and has no record of immunization should receive the PCV13 vaccine. An adult aged 19 years or older who has certain medical conditions and has not been previously immunized should receive 1 dose of PCV13 vaccine. This PCV13 should be followed with a dose of pneumococcal polysaccharide (PPSV23) vaccine. The PPSV23 vaccine dose should be obtained at least 8 weeks after the dose of PCV13 vaccine. An adult aged 19 years or older who has certain medical conditions and  previously received 1 or more doses of PPSV23 vaccine should receive 1 dose of PCV13. The PCV13 vaccine dose should be obtained 1 or more years after the last PPSV23 vaccine dose.  Pneumococcal polysaccharide (PPSV23) vaccine. When PCV13 is also indicated, PCV13 should be obtained first. All adults aged 65 years and older should be immunized. An adult younger than age 65 years who has certain medical conditions should be immunized. Any person who resides in a nursing home or long-term care facility should be immunized. An adult smoker should be immunized. People with an immunocompromised condition and certain other conditions should receive both PCV13 and PPSV23 vaccines. People with human immunodeficiency virus (HIV) infection should be immunized as soon as possible after diagnosis. Immunization during chemotherapy or radiation therapy should be avoided. Routine use of PPSV23 vaccine is not recommended for American Indians, [place] Natives, or people younger than 65 years unless there are medical conditions that require PPSV23 vaccine. When indicated, people who have unknown immunization and have no record of immunization should receive PPSV23 vaccine. One-time revaccination 5 years after the first dose of PPSV23 is recommended for people aged 19-64 years who have chronic kidney failure, nephrotic syndrome, asplenia, or immunocompromised conditions. People who received 1-2 doses of PPSV23 before age 65 years should receive another dose of PPSV23 vaccine at age 65 years or later if at least 5 years have passed since the previous dose. Doses of PPSV23 are not needed for people immunized with PPSV23 at or after age 65 years.  Meningococcal vaccine. Adults with asplenia or persistent complement component deficiencies should receive 2 doses of quadrivalent meningococcal conjugate (MenACWY-D) vaccine. The doses should be obtained at least 2 months apart. Microbiologists working with certain meningococcal bacteria,  military recruits, people at risk during an outbreak, and people who travel to or live in countries with a high rate of meningitis should be immunized. A first-year college student up through age 21 years who is living in a residence hall should receive a dose if he did not receive a dose on or after his 16th birthday. Adults who have certain high-risk conditions should receive one or more doses of vaccine.  Hepatitis A vaccine. Adults who wish to be protected from this disease, have certain high-risk conditions, work with hepatitis A-infected animals, work in hepatitis A research labs, or   travel to or work in countries with a high rate of hepatitis A should be immunized. Adults who were previously unvaccinated and who anticipate close contact with an international adoptee during the first 60 days after arrival in the [place] from a country with a high rate of hepatitis A should be immunized.  Hepatitis B vaccine. Adults should be immunized if they wish to be protected from this disease, have certain high-risk conditions, may be exposed to blood or other infectious body fluids, are household contacts or sex partners of hepatitis B positive people, are clients or workers in certain care facilities, or travel to or work in countries with a high rate of hepatitis B.  Haemophilus influenzae type b (Hib) vaccine. A previously unvaccinated person with asplenia or sickle cell disease or having a scheduled splenectomy should receive 1 dose of Hib vaccine. Regardless of previous immunization, a recipient of a hematopoietic stem cell transplant should receive a 3-dose series 6-12 months after his successful transplant. Hib vaccine is not recommended for adults with HIV infection. Preventive Service / Frequency Ages 52 to 17  Blood pressure check.** / Every 1 to 2 years.  Lipid and cholesterol check.** / Every 5 years beginning at age 69.  Hepatitis C blood test.** / For any individual with known risks for  hepatitis C.  Skin self-exam. / Monthly.  Influenza vaccine. / Every year.  Tetanus, diphtheria, and acellular pertussis (Tdap, Td) vaccine.** / Consult your health care provider. 1 dose of Td every 10 years.  Varicella vaccine.** / Consult your health care provider.  HPV vaccine. / 3 doses over 6 months, if 72 or younger.  Measles, mumps, rubella (MMR) vaccine.** / You need at least 1 dose of MMR if you were born in 1957 or later. You may also need a second dose.  Pneumococcal 13-valent conjugate (PCV13) vaccine.** / Consult your health care provider.  Pneumococcal polysaccharide (PPSV23) vaccine.** / 1 to 2 doses if you smoke cigarettes or if you have certain conditions.  Meningococcal vaccine.** / 1 dose if you are age 35 to 60 years and a Market researcher living in a residence hall, or have one of several medical conditions. You may also need additional booster doses.  Hepatitis A vaccine.** / Consult your health care provider.  Hepatitis B vaccine.** / Consult your health care provider.  Haemophilus influenzae type b (Hib) vaccine.** / Consult your health care provider. Ages 35 to 8  Blood pressure check.** / Every 1 to 2 years.  Lipid and cholesterol check.** / Every 5 years beginning at age 57.  Lung cancer screening. / Every year if you are aged 44-80 years and have a 30-pack-year history of smoking and currently smoke or have quit within the past 15 years. Yearly screening is stopped once you have quit smoking for at least 15 years or develop a health problem that would prevent you from having lung cancer treatment.  Fecal occult blood test (FOBT) of stool. / Every year beginning at age 55 and continuing until age 73. You may not have to do this test if you get a colonoscopy every 10 years.  Flexible sigmoidoscopy** or colonoscopy.** / Every 5 years for a flexible sigmoidoscopy or every 10 years for a colonoscopy beginning at age 28 and continuing until age  1.  Hepatitis C blood test.** / For all people born from [birth year] through 1965 and any individual with known risks for hepatitis C.  Skin self-exam. / Monthly.  Influenza vaccine. / Every  year.  Tetanus, diphtheria, and acellular pertussis (Tdap/Td) vaccine.** / Consult your health care provider. 1 dose of Td every 10 years.  Varicella vaccine.** / Consult your health care provider.  Zoster vaccine.** / 1 dose for adults aged 53 years or older.  Measles, mumps, rubella (MMR) vaccine.** / You need at least 1 dose of MMR if you were born in 1957 or later. You may also need a second dose.  Pneumococcal 13-valent conjugate (PCV13) vaccine.** / Consult your health care provider.  Pneumococcal polysaccharide (PPSV23) vaccine.** / 1 to 2 doses if you smoke cigarettes or if you have certain conditions.  Meningococcal vaccine.** / Consult your health care provider.  Hepatitis A vaccine.** / Consult your health care provider.  Hepatitis B vaccine.** / Consult your health care provider.  Haemophilus influenzae type b (Hib) vaccine.** / Consult your health care provider. Ages 77 and over  Blood pressure check.** / Every 1 to 2 years.  Lipid and cholesterol check.**/ Every 5 years beginning at age 85.  Lung cancer screening. / Every year if you are aged 55-80 years and have a 30-pack-year history of smoking and currently smoke or have quit within the past 15 years. Yearly screening is stopped once you have quit smoking for at least 15 years or develop a health problem that would prevent you from having lung cancer treatment.  Fecal occult blood test (FOBT) of stool. / Every year beginning at age 33 and continuing until age 11. You may not have to do this test if you get a colonoscopy every 10 years.  Flexible sigmoidoscopy** or colonoscopy.** / Every 5 years for a flexible sigmoidoscopy or every 10 years for a colonoscopy beginning at age 28 and continuing until age 73.  Hepatitis C blood  test.** / For all people born from [birth year] through 1965 and any individual with known risks for hepatitis C.  Abdominal aortic aneurysm (AAA) screening.** / A one-time screening for ages 50 to 27 years who are current or former smokers.  Skin self-exam. / Monthly.  Influenza vaccine. / Every year.  Tetanus, diphtheria, and acellular pertussis (Tdap/Td) vaccine.** / 1 dose of Td every 10 years.  Varicella vaccine.** / Consult your health care provider.  Zoster vaccine.** / 1 dose for adults aged 34 years or older.  Pneumococcal 13-valent conjugate (PCV13) vaccine.** / Consult your health care provider.  Pneumococcal polysaccharide (PPSV23) vaccine.** / 1 dose for all adults aged 63 years and older.  Meningococcal vaccine.** / Consult your health care provider.  Hepatitis A vaccine.** / Consult your health care provider.  Hepatitis B vaccine.** / Consult your health care provider.  Haemophilus influenzae type b (Hib) vaccine.** / Consult your health care provider. **Family history and personal history of risk and conditions may change your health care provider's recommendations. Document Released: 06/06/2001 Document Revised: 04/15/2013 Document Reviewed: 09/05/2010 [facility] Patient Information 2015 [place], [place]. This information is not intended to replace advice given to you by your health care provider. Make sure you discuss any questions you have with your health care provider.

## 2015-01-25 NOTE — Progress Notes (Signed)
Pre visit review using our clinic review tool, if applicable. No additional management support is needed unless otherwise documented below in the visit note. 

## 2015-01-25 NOTE — Assessment & Plan Note (Signed)
Will ref to Dr Gutterman °

## 2015-01-26 ENCOUNTER — Telehealth: Payer: Self-pay | Admitting: Internal Medicine

## 2015-01-26 ENCOUNTER — Other Ambulatory Visit (INDEPENDENT_AMBULATORY_CARE_PROVIDER_SITE_OTHER): Payer: Medicare Other

## 2015-01-26 DIAGNOSIS — D485 Neoplasm of uncertain behavior of skin: Secondary | ICD-10-CM | POA: Insufficient documentation

## 2015-01-26 DIAGNOSIS — Z658 Other specified problems related to psychosocial circumstances: Secondary | ICD-10-CM

## 2015-01-26 DIAGNOSIS — G4733 Obstructive sleep apnea (adult) (pediatric): Secondary | ICD-10-CM | POA: Diagnosis not present

## 2015-01-26 DIAGNOSIS — Z0000 Encounter for general adult medical examination without abnormal findings: Secondary | ICD-10-CM | POA: Diagnosis not present

## 2015-01-26 DIAGNOSIS — F439 Reaction to severe stress, unspecified: Secondary | ICD-10-CM

## 2015-01-26 DIAGNOSIS — I2583 Coronary atherosclerosis due to lipid rich plaque: Secondary | ICD-10-CM

## 2015-01-26 DIAGNOSIS — E785 Hyperlipidemia, unspecified: Secondary | ICD-10-CM | POA: Diagnosis not present

## 2015-01-26 DIAGNOSIS — I2510 Atherosclerotic heart disease of native coronary artery without angina pectoris: Secondary | ICD-10-CM

## 2015-01-26 DIAGNOSIS — Z9989 Dependence on other enabling machines and devices: Secondary | ICD-10-CM

## 2015-01-26 LAB — URINALYSIS, ROUTINE W REFLEX MICROSCOPIC
Bilirubin Urine: NEGATIVE
Ketones, ur: NEGATIVE
NITRITE: NEGATIVE
PH: 6 (ref 5.0–8.0)
Specific Gravity, Urine: 1.02 (ref 1.000–1.030)
TOTAL PROTEIN, URINE-UPE24: NEGATIVE
URINE GLUCOSE: NEGATIVE
Urobilinogen, UA: 0.2 (ref 0.0–1.0)

## 2015-01-26 LAB — HEPATIC FUNCTION PANEL
ALBUMIN: 4.1 g/dL (ref 3.5–5.2)
ALK PHOS: 50 U/L (ref 39–117)
ALT: 21 U/L (ref 0–53)
AST: 23 U/L (ref 0–37)
BILIRUBIN DIRECT: 0.1 mg/dL (ref 0.0–0.3)
TOTAL PROTEIN: 6.2 g/dL (ref 6.0–8.3)
Total Bilirubin: 0.6 mg/dL (ref 0.2–1.2)

## 2015-01-26 LAB — BASIC METABOLIC PANEL
BUN: 15 mg/dL (ref 6–23)
CHLORIDE: 106 meq/L (ref 96–112)
CO2: 27 meq/L (ref 19–32)
Calcium: 9.3 mg/dL (ref 8.4–10.5)
Creatinine, Ser: 1.03 mg/dL (ref 0.40–1.50)
GFR: 76.15 mL/min (ref >60.00)
Glucose, Bld: 107 mg/dL — ABNORMAL HIGH (ref 70–99)
POTASSIUM: 4.5 meq/L (ref 3.5–5.1)
SODIUM: 141 meq/L (ref 135–145)

## 2015-01-26 LAB — CBC WITH DIFFERENTIAL/PLATELET
BASOS PCT: 0.6 % (ref 0.0–3.0)
Basophils Absolute: 0 10*3/uL (ref 0.0–0.1)
Eosinophils Absolute: 0.1 10*3/uL (ref 0.0–0.7)
Eosinophils Relative: 2.2 % (ref 0.0–5.0)
HEMATOCRIT: 45.8 % (ref 39.0–52.0)
HEMOGLOBIN: 14.9 g/dL (ref 13.0–17.0)
LYMPHS PCT: 23.7 % (ref 12.0–46.0)
Lymphs Abs: 1.5 10*3/uL (ref 0.7–4.0)
MCHC: 32.6 g/dL (ref 30.0–36.0)
MCV: 98.3 fl (ref 78.0–100.0)
MONOS PCT: 9.8 % (ref 3.0–12.0)
Monocytes Absolute: 0.6 10*3/uL (ref 0.1–1.0)
NEUTROS ABS: 4 10*3/uL (ref 1.4–7.7)
Neutrophils Relative %: 63.7 % (ref 43.0–77.0)
PLATELETS: 216 10*3/uL (ref 150.0–400.0)
RBC: 4.66 Mil/uL (ref 4.22–5.81)
RDW: 13.5 % (ref 11.5–15.5)
WBC: 6.3 10*3/uL (ref 4.0–10.5)

## 2015-01-26 LAB — LIPID PANEL
CHOL/HDL RATIO: 3
Cholesterol: 147 mg/dL (ref 0–200)
HDL: 58.4 mg/dL (ref >39.00)
LDL Cholesterol: 76 mg/dL (ref 0–99)
NonHDL: 88.6
Triglycerides: 64 mg/dL (ref 0.0–149.0)
VLDL: 12.8 mg/dL (ref 0.0–40.0)

## 2015-01-26 LAB — HEPATITIS C ANTIBODY: HCV AB: NEGATIVE

## 2015-01-26 LAB — PSA: PSA: 1.53 ng/mL (ref 0.10–4.00)

## 2015-01-26 LAB — TSH: TSH: 0.93 u[IU]/mL (ref 0.35–4.50)

## 2015-01-26 NOTE — Assessment & Plan Note (Signed)
Bx adviced

## 2015-01-26 NOTE — Telephone Encounter (Signed)
Alan Wall, please ask pt if he would like to sch a skin bx of L breast mole Thx

## 2015-01-29 NOTE — Telephone Encounter (Signed)
I called pt- OV scheduled 02/03/15 at 10:30. Pt aware.

## 2015-02-03 ENCOUNTER — Ambulatory Visit (INDEPENDENT_AMBULATORY_CARE_PROVIDER_SITE_OTHER): Payer: Medicare Other | Admitting: Internal Medicine

## 2015-02-03 ENCOUNTER — Encounter: Payer: Self-pay | Admitting: Internal Medicine

## 2015-02-03 VITALS — BP 118/80 | HR 62 | Wt 170.0 lb

## 2015-02-03 DIAGNOSIS — D235 Other benign neoplasm of skin of trunk: Secondary | ICD-10-CM

## 2015-02-03 DIAGNOSIS — D485 Neoplasm of uncertain behavior of skin: Secondary | ICD-10-CM

## 2015-02-03 NOTE — Patient Instructions (Signed)
Postprocedure instructions :    A Band-Aid should be  changed twice daily. You can take a shower tomorrow.  Keep the wounds clean. You can wash them with liquid soap and water. Pat dry with gauze or a Kleenex tissue  Before applying antibiotic ointment and a Band-Aid.   You need to report immediately  if fever, chills or any signs of infection develop.    The biopsy results should be available in 1 -2 weeks. 

## 2015-02-03 NOTE — Addendum Note (Signed)
Addended by: Cresenciano Lick on: 02/03/2015 11:04 AM   Modules accepted: Orders

## 2015-02-03 NOTE — Assessment & Plan Note (Signed)
Shave bx today

## 2015-02-03 NOTE — Progress Notes (Signed)
Subjective:  Patient ID: Alan Wall, male    DOB: 16-Sep-1945  Age: 69 y.o. MRN: [id number]  CC: No chief complaint on file.   HPI Alan Wall presents for skin bx  Outpatient Prescriptions Prior to Visit  Medication Sig Dispense Refill  . aspirin 81 MG tablet Take 81 mg by mouth every other day.     . Chlorpheniramine-PSE-Ibuprofen (ADVIL ALLERGY SINUS) 2-30-200 MG TABS 1-2 tabs PO Q4-6 hrs PRN 30 each 1  . cholecalciferol (VITAMIN D) 1000 UNITS tablet Take 1 tablet (1,000 Units total) by mouth daily. 100 tablet 3  . EPINEPHrine (EPIPEN 2-PAK) 0.3 mg/0.3 mL DEVI Inject 0.3 mg into the muscle as directed.      . fluticasone (FLONASE) 50 MCG/ACT nasal spray   4  . hydrocortisone cream 1 % Apply topically 2 (two) times daily. 30 g 3  . methylPREDNISolone (MEDROL DOSEPAK) 4 MG tablet Use as directed on package instructions 21 tablet 0  . nystatin cream (MYCOSTATIN) Apply topically 2 (two) times daily. 30 g 3  . pravastatin (PRAVACHOL) 40 MG tablet Take 1 tablet (40 mg total) by mouth daily. 30 tablet 0  . zolpidem (AMBIEN) 10 MG tablet Take 1 tablet (10 mg total) by mouth at bedtime as needed. 30 tablet 3   No facility-administered medications prior to visit.    ROS Review of Systems  Objective:  BP 118/80 mmHg  Pulse 62  Wt 170 lb (77.111 kg)  SpO2 95%  BP Readings from Last 3 Encounters:  02/03/15 118/80  01/25/15 130/88  07/01/14 119/76    Wt Readings from Last 3 Encounters:  02/03/15 170 lb (77.111 kg)  01/25/15 169 lb (76.658 kg)  12/12/13 163 lb (73.936 kg)    Physical Exam L chest 6x4 mm mole. irreg colour Lab Results  Component Value Date   WBC 6.3 01/26/2015   HGB 14.9 01/26/2015   HCT 45.8 01/26/2015   PLT 216.0 01/26/2015   GLUCOSE 107* 01/26/2015   CHOL 147 01/26/2015   TRIG 64.0 01/26/2015   HDL 58.40 01/26/2015   LDLDIRECT 125.2 06/12/2012   LDLCALC 76 01/26/2015   ALT 21 01/26/2015   AST 23 01/26/2015   NA 141 01/26/2015   K 4.5  01/26/2015   CL 106 01/26/2015   CREATININE 1.03 01/26/2015   BUN 15 01/26/2015   CO2 27 01/26/2015   TSH 0.93 01/26/2015   PSA 1.53 01/26/2015    Procedure Note :     Procedure :  Skin biopsy   Indication:  Changing mole (s ),  Suspicious lesion(s)   Risks including unsuccessful procedure , bleeding, infection, bruising, scar, a need for another complete procedure and others were explained to the patient in detail as well as the benefits. Informed consent was obtained and signed.   The patient was placed in a decubitus position.  Lesion #1 on  L pec muscle   measuring  6x4   mm   Skin over lesion #1  was prepped with Betadine and alcohol  and anesthetized with 1/2 cc of 2% lidocaine and epinephrine, using a 25-gauge 1 inch needle.  Shave biopsy with a sterile Dermablade was carried out in the usual fashion. Hyfrecator was used to destroy the rest of the lesion potentially left behind and for hemostasis. Band-Aid was applied with antibiotic ointment.     Tolerated well. Complications none.     No results found.  Assessment & Plan:   There are no diagnoses linked to this  encounter. I am having [name] maintain his aspirin, EPINEPHrine, zolpidem, hydrocortisone cream, nystatin cream, methylPREDNISolone, Chlorpheniramine-PSE-Ibuprofen, pravastatin, fluticasone, and cholecalciferol.  No orders of the defined types were placed in this encounter.     Follow-up: No Follow-up on file.  Walker Kehr, MD

## 2015-02-03 NOTE — Progress Notes (Signed)
Pre visit review using our clinic review tool, if applicable. No additional management support is needed unless otherwise documented below in the visit note. 

## 2015-02-08 ENCOUNTER — Encounter: Payer: Self-pay | Admitting: Internal Medicine

## 2015-02-11 ENCOUNTER — Other Ambulatory Visit: Payer: Self-pay | Admitting: Internal Medicine

## 2015-04-16 ENCOUNTER — Other Ambulatory Visit: Payer: Self-pay | Admitting: Internal Medicine

## 2015-05-15 ENCOUNTER — Encounter: Payer: Self-pay | Admitting: Internal Medicine

## 2015-05-18 NOTE — Telephone Encounter (Signed)
Called Dr. Cheryln Manly office spoke with Karna Christmas she stated at back in Oct when md sent referral he was not taking any new patients, but he is willing to take some now. She is going to contact pt to get him set-up!

## 2015-12-29 ENCOUNTER — Emergency Department (HOSPITAL_COMMUNITY)
Admission: EM | Admit: 2015-12-29 | Discharge: 2015-12-29 | Disposition: A | Discharge status: Home / Self Care | Payer: Medicare Other | Attending: Emergency Medicine | Admitting: Emergency Medicine

## 2015-12-29 ENCOUNTER — Encounter (HOSPITAL_COMMUNITY): Payer: Self-pay

## 2015-12-29 ENCOUNTER — Emergency Department (HOSPITAL_COMMUNITY): Payer: Medicare Other

## 2015-12-29 DIAGNOSIS — R202 Paresthesia of skin: Secondary | ICD-10-CM | POA: Insufficient documentation

## 2015-12-29 DIAGNOSIS — R200 Anesthesia of skin: Secondary | ICD-10-CM | POA: Diagnosis present

## 2015-12-29 DIAGNOSIS — Z79899 Other long term (current) drug therapy: Secondary | ICD-10-CM | POA: Insufficient documentation

## 2015-12-29 DIAGNOSIS — Z7951 Long term (current) use of inhaled steroids: Secondary | ICD-10-CM | POA: Diagnosis not present

## 2015-12-29 DIAGNOSIS — I2510 Atherosclerotic heart disease of native coronary artery without angina pectoris: Secondary | ICD-10-CM | POA: Diagnosis not present

## 2015-12-29 DIAGNOSIS — Z7982 Long term (current) use of aspirin: Secondary | ICD-10-CM | POA: Insufficient documentation

## 2015-12-29 LAB — DIFFERENTIAL
Basophils Absolute: 0 10*3/uL (ref 0.0–0.1)
Basophils Relative: 0 %
EOS ABS: 0.2 10*3/uL (ref 0.0–0.7)
EOS PCT: 2 %
LYMPHS ABS: 2.5 10*3/uL (ref 0.7–4.0)
Lymphocytes Relative: 36 %
MONO ABS: 0.7 10*3/uL (ref 0.1–1.0)
MONOS PCT: 10 %
NEUTROS PCT: 52 %
Neutro Abs: 3.5 10*3/uL (ref 1.7–7.7)

## 2015-12-29 LAB — RAPID URINE DRUG SCREEN, HOSP PERFORMED
Amphetamines: NOT DETECTED
Barbiturates: NOT DETECTED
Benzodiazepines: NOT DETECTED
COCAINE: NOT DETECTED
OPIATES: NOT DETECTED
Tetrahydrocannabinol: NOT DETECTED

## 2015-12-29 LAB — I-STAT CHEM 8, ED
BUN: 18 mg/dL (ref 6–20)
CALCIUM ION: 1.11 mmol/L — AB (ref 1.15–1.40)
CREATININE: 1 mg/dL (ref 0.61–1.24)
Chloride: 103 mmol/L (ref 101–111)
Glucose, Bld: 104 mg/dL — ABNORMAL HIGH (ref 65–99)
HEMATOCRIT: 45 % (ref 39.0–52.0)
HEMOGLOBIN: 15.3 g/dL (ref 13.0–17.0)
Potassium: 4 mmol/L (ref 3.5–5.1)
SODIUM: 140 mmol/L (ref 135–145)
TCO2: 24 mmol/L (ref 0–100)

## 2015-12-29 LAB — PROTIME-INR
INR: 1.07
Prothrombin Time: 14 seconds (ref 11.4–15.2)

## 2015-12-29 LAB — URINALYSIS, ROUTINE W REFLEX MICROSCOPIC
Bilirubin Urine: NEGATIVE
GLUCOSE, UA: NEGATIVE mg/dL
Hgb urine dipstick: NEGATIVE
KETONES UR: NEGATIVE mg/dL
NITRITE: NEGATIVE
PH: 7 (ref 5.0–8.0)
Protein, ur: NEGATIVE mg/dL
Specific Gravity, Urine: 1.023 (ref 1.005–1.030)

## 2015-12-29 LAB — COMPREHENSIVE METABOLIC PANEL
ALBUMIN: 4 g/dL (ref 3.5–5.0)
ALK PHOS: 50 U/L (ref 38–126)
ALT: 18 U/L (ref 17–63)
ANION GAP: 6 (ref 5–15)
AST: 27 U/L (ref 15–41)
BILIRUBIN TOTAL: 0.8 mg/dL (ref 0.3–1.2)
BUN: 17 mg/dL (ref 6–20)
CALCIUM: 9.2 mg/dL (ref 8.9–10.3)
CO2: 25 mmol/L (ref 22–32)
Chloride: 108 mmol/L (ref 101–111)
Creatinine, Ser: 1.04 mg/dL (ref 0.61–1.24)
GFR calc Af Amer: >60 mL/min (ref >60)
Glucose, Bld: 112 mg/dL — ABNORMAL HIGH (ref 65–99)
POTASSIUM: 4.1 mmol/L (ref 3.5–5.1)
Sodium: 139 mmol/L (ref 135–145)
TOTAL PROTEIN: 6.6 g/dL (ref 6.5–8.1)

## 2015-12-29 LAB — URINE MICROSCOPIC-ADD ON: Squamous Epithelial / LPF: NONE SEEN

## 2015-12-29 LAB — I-STAT TROPONIN, ED: TROPONIN I, POC: 0.04 ng/mL (ref 0.00–0.08)

## 2015-12-29 LAB — APTT: aPTT: 26 seconds (ref 24–36)

## 2015-12-29 LAB — CBC
HCT: 41.9 % (ref 39.0–52.0)
HEMOGLOBIN: 14.5 g/dL (ref 13.0–17.0)
MCH: 33 pg (ref 26.0–34.0)
MCHC: 34.6 g/dL (ref 30.0–36.0)
MCV: 95.2 fL (ref 78.0–100.0)
Platelets: 195 10*3/uL (ref 150–400)
RBC: 4.4 MIL/uL (ref 4.22–5.81)
RDW: 12.3 % (ref 11.5–15.5)
WBC: 6.8 10*3/uL (ref 4.0–10.5)

## 2015-12-29 LAB — ETHANOL: Alcohol, Ethyl (B): <5 mg/dL (ref <5)

## 2015-12-29 NOTE — ED Notes (Signed)
Pt transported to MRI 

## 2015-12-29 NOTE — ED Notes (Signed)
Pt states gait abnormality/balance has been off for over a month

## 2015-12-29 NOTE — ED Notes (Signed)
Pt states he has metal in his eyes from cataract removal

## 2015-12-29 NOTE — ED Provider Notes (Signed)
[facility] DEPT Provider Note   CSN: [id number] Arrival date & time: 12/29/15  2059     History   Chief Complaint Chief Complaint  Patient presents with  . Numbness    HPI Alan Wall is a 70 y.o. male with no significant past medical history presenting today for dizziness and right arm numbness. Patient states this is been going on for the past 2 days. Nothing has made his symptoms better or worse, they're random in onset. He describes his entire right upper extremity going numb with pain in his right shoulder. He also describes intermittent dizziness with blurry vision. He has follow-up with his primary care physician next week, but he became more frightened and came to the emergency department out of concern for stroke. He has no history of stroke or heart attacks. He denies pain elsewhere in his body. There are no further complaints.  10 Systems reviewed and are negative for acute change except as noted in the HPI.    HPI  Past Medical History:  Diagnosis Date  . Allergy    Food/cat dander/bee stings  . Hematoma of leg   . Hyperlipidemia   . S/P dilatation of esophageal stricture     Patient Active Problem List   Diagnosis Date Noted  . Neoplasm of uncertain behavior of skin 01/26/2015  . OSA on CPAP 01/25/2015  . CAD (coronary artery disease) 01/25/2015  . Well adult exam 05/20/2012  . Pruritus ani 02/13/2012  . Stress 02/13/2012  . Wound, open, forearm 01/26/2011  . Fall against object 01/25/2011  . Degloving injury 01/25/2011  . Flank pain, acute 01/25/2011  . Knee pain, acute 01/25/2011  . Concussion 01/25/2011  . Vertigo 12/13/2010  . INSOMNIA, PERSISTENT 11/23/2008  . ALLERGY, FOOD 11/23/2008  . GERD 04/16/2007  . ERECTILE DYSFUNCTION 04/16/2007  . HYPERLIPIDEMIA 01/31/2007    Past Surgical History:  Procedure Laterality Date  . CATARACT EXTRACTION W/ INTRAOCULAR LENS  IMPLANT, BILATERAL  2010       Home Medications    Prior to Admission  medications   Medication Sig Start Date End Date Taking? Authorizing Provider  aspirin 81 MG tablet Take 81 mg by mouth every other day.     Historical Provider, MD  Chlorpheniramine-PSE-Ibuprofen (ADVIL ALLERGY SINUS) 2-30-200 MG TABS 1-2 tabs PO Q4-6 hrs PRN 06/28/14   Liam Graham, PA-C  cholecalciferol (VITAMIN D) 1000 UNITS tablet Take 1 tablet (1,000 Units total) by mouth daily. 01/25/15 01/25/16  Evie Lacks Plotnikov, MD  EPINEPHrine (EPIPEN 2-PAK) 0.3 mg/0.3 mL DEVI Inject 0.3 mg into the muscle as directed.      Historical Provider, MD  fluticasone Asencion Islam) 50 MCG/ACT nasal spray  01/13/15   Historical Provider, MD  hydrocortisone cream 1 % Apply topically 2 (two) times daily. 06/09/12   Harden Mo, MD  methylPREDNISolone (MEDROL DOSEPAK) 4 MG tablet Use as directed on package instructions 06/28/14   Liam Graham, PA-C  nystatin cream (MYCOSTATIN) Apply topically 2 (two) times daily. 06/09/12   Harden Mo, MD  pravastatin (PRAVACHOL) 40 MG tablet TAKE 1 TABLET (40 MG TOTAL) BY MOUTH DAILY. 02/11/15   Evie Lacks Plotnikov, MD  zolpidem (AMBIEN) 10 MG tablet Take 1 tablet (10 mg total) by mouth at bedtime as needed. 03/08/11   Cassandria Anger, MD    Family History Family History  Problem Relation Age of Onset  . Heart disease Father     CABG at 67 yo  . Heart disease Brother   .  Prostate cancer Paternal Uncle   . Coronary artery disease Other     Social History Social History  Substance Use Topics  . Smoking status: Never Smoker  . Smokeless tobacco: Never Used  . Alcohol use 1.2 oz/week    2 Glasses of wine per week     Allergies   Bee venom   Review of Systems Review of Systems   Physical Exam Updated Vital Signs BP 119/99 (BP Location: Left Arm)   Pulse (!) 55   Temp 97.8 F (36.6 C) (Oral)   Resp 10   SpO2 98%   Physical Exam  Constitutional: He is oriented to person, place, and time. Vital signs are normal. He appears well-developed and  well-nourished.  Non-toxic appearance. He does not appear ill. No distress.  HENT:  Head: Normocephalic and atraumatic.  Nose: Nose normal.  Mouth/Throat: Oropharynx is clear and moist. No oropharyngeal exudate.  Eyes: Conjunctivae and EOM are normal. Pupils are equal, round, and reactive to light. No scleral icterus.  Neck: Normal range of motion. Neck supple. No tracheal deviation, no edema, no erythema and normal range of motion present. No thyroid mass and no thyromegaly present.  Cardiovascular: Normal rate, regular rhythm, S1 normal, S2 normal, normal heart sounds, intact distal pulses and normal pulses.  Exam reveals no gallop and no friction rub.   No murmur heard. Pulmonary/Chest: Effort normal and breath sounds normal. No respiratory distress. He has no wheezes. He has no rhonchi. He has no rales.  Abdominal: Soft. Normal appearance and bowel sounds are normal. He exhibits no distension, no ascites and no mass. There is no hepatosplenomegaly. There is no tenderness. There is no rebound, no guarding and no CVA tenderness.  Musculoskeletal: Normal range of motion. He exhibits no edema or tenderness.  Lymphadenopathy:    He has no cervical adenopathy.  Neurological: He is alert and oriented to person, place, and time. He has normal strength. No cranial nerve deficit or sensory deficit.  Normal strength and sensation in all extremities.  Normal cerebellar testing.    Skin: Skin is warm, dry and intact. No petechiae and no rash noted. He is not diaphoretic. No erythema. No pallor.  Nursing note and vitals reviewed.    ED Treatments / Results  Labs (all labs ordered are listed, but only abnormal results are displayed) Labs Reviewed  COMPREHENSIVE METABOLIC PANEL - Abnormal; Notable for the following:       Result Value   Glucose, Bld 112 (*)    All other components within normal limits  URINALYSIS, ROUTINE W REFLEX MICROSCOPIC (NOT AT [facility]) - Abnormal; Notable for the following:     APPearance CLOUDY (*)    Leukocytes, UA TRACE (*)    All other components within normal limits  URINE MICROSCOPIC-ADD ON - Abnormal; Notable for the following:    Bacteria, UA RARE (*)    All other components within normal limits  I-STAT CHEM 8, ED - Abnormal; Notable for the following:    Glucose, Bld 104 (*)    Calcium, Ion 1.11 (*)    All other components within normal limits  ETHANOL  PROTIME-INR  APTT  CBC  DIFFERENTIAL  URINE RAPID DRUG SCREEN, HOSP PERFORMED  I-STAT TROPOININ, ED    EKG  EKG Interpretation  Date/Time:  Wednesday December 29 2015 21:37:56 EDT Ventricular Rate:  56 PR Interval:    QRS Duration: 96 QT Interval:  437 QTC Calculation: 422 R Axis:   11 Text Interpretation:  Sinus rhythm  Borderline T abnormalities, inferior leads No old tracing to compare Confirmed by Glynn Octave [phone]) on 12/29/2015 10:49:43 PM       Radiology Mr Brain Wo Contrast  Result Date: 12/29/2015 CLINICAL DATA:  Stroke-like symptoms for the last 2 days. EXAM: MRI HEAD WITHOUT CONTRAST TECHNIQUE: Multiplanar, multiecho pulse sequences of the brain and surrounding structures were obtained without intravenous contrast. COMPARISON:  Head CT 01/16/2011 FINDINGS: Brain: No acute infarct or intraparenchymal hemorrhage. The midline structures are normal. There are scattered foci of hyperintense T2 weighted signal within the periventricular and subcortical white matter compatible with mild chronic microvascular disease, not unexpected for age. No mass lesion or midline shift. No hydrocephalus or extra-axial fluid collection. Vascular: Major intracranial flow voids are preserved. No evidence of chronic microhemorrhage or amyloid angiopathy. Skull and upper cervical spine: The visualized skull base, calvarium, upper cervical spine and extracranial soft tissues are normal. Sinuses/Orbits: No fluid levels or advanced mucosal thickening. No mastoid effusion. Normal orbits. IMPRESSION: 1. No  acute intracranial abnormality. 2. Findings of mild chronic microvascular disease but otherwise normal brain MRI for age. Electronically Signed   By: Ulyses Jarred M.D.   On: 12/29/2015 22:57    Procedures Procedures (including critical care time)  Medications Ordered in ED Medications - No data to display   Initial Impression / Assessment and Plan / ED Course  I have reviewed the triage vital signs and the nursing notes.  Pertinent labs & imaging results that were available during my care of the patient were reviewed by me and considered in my medical decision making (see chart for details).  Clinical Course    Patient presents emergency department for dizziness, and right upper extremity numbness. MRI was obtained and is negative for any stroke. Symptoms have been resolved since presenting to the emergency department. This could be a neuropathy or pinched nerve. He was advised to follow-up with his primary care physician for further diagnostics. Images good understanding. He appears well in no acute distress, vital signs remain within his normal limits and he is safe for discharge.  Final Clinical Impressions(s) / ED Diagnoses   Final diagnoses:  Paresthesia    New Prescriptions New Prescriptions   No medications on file     Everlene Balls, MD 12/29/15 2326

## 2015-12-29 NOTE — ED Triage Notes (Signed)
Pt complains of right sided arm numbness for two days. He also states that his gait has been off some and he has been cleaning his glasses a lot as if his vision is blurred

## 2015-12-31 ENCOUNTER — Ambulatory Visit (INDEPENDENT_AMBULATORY_CARE_PROVIDER_SITE_OTHER): Payer: Medicare Other | Admitting: Internal Medicine

## 2015-12-31 ENCOUNTER — Encounter: Payer: Self-pay | Admitting: Internal Medicine

## 2015-12-31 ENCOUNTER — Ambulatory Visit: Payer: Medicare Other | Admitting: Internal Medicine

## 2015-12-31 ENCOUNTER — Other Ambulatory Visit (INDEPENDENT_AMBULATORY_CARE_PROVIDER_SITE_OTHER): Payer: Medicare Other

## 2015-12-31 VITALS — BP 120/86 | HR 68 | Temp 98.0°F | Wt 161.0 lb

## 2015-12-31 DIAGNOSIS — R200 Anesthesia of skin: Secondary | ICD-10-CM | POA: Insufficient documentation

## 2015-12-31 DIAGNOSIS — R202 Paresthesia of skin: Secondary | ICD-10-CM | POA: Diagnosis not present

## 2015-12-31 DIAGNOSIS — Z23 Encounter for immunization: Secondary | ICD-10-CM | POA: Diagnosis not present

## 2015-12-31 LAB — TSH: TSH: 0.89 u[IU]/mL (ref 0.35–4.50)

## 2015-12-31 LAB — FOLATE: Folate: 13.9 ng/mL (ref >5.9)

## 2015-12-31 LAB — HEMOGLOBIN A1C: HEMOGLOBIN A1C: 6 % (ref 4.6–6.5)

## 2015-12-31 LAB — VITAMIN B12: VITAMIN B 12: 257 pg/mL (ref 211–911)

## 2015-12-31 NOTE — Progress Notes (Signed)
Pre visit review using our clinic review tool, if applicable. No additional management support is needed unless otherwise documented below in the visit note. 

## 2015-12-31 NOTE — Assessment & Plan Note (Signed)
MRI cervical spine to look for nerve impingement. Checking HgA1c, TSH, B12, folate to look for other cause. Not currently having symptoms in the office. Hold off on medication.

## 2015-12-31 NOTE — Patient Instructions (Signed)
We have put in the labs and the MRI of the neck which will be scheduled. You will be called with all results.

## 2015-12-31 NOTE — Progress Notes (Signed)
   Subjective:    Patient ID: Alan Wall, male    DOB: 1945-06-17, 70 y.o.   MRN: [id number]  HPI The patient is a 70 YO man coming in for ER follow up  (in for abnormal sensation, right arm numbness, labs normal, EKG normal, MRI negative for stroke). Since the ER visit he has still been having some numbness in his right arm. They happen in episodes and accompanied by pain in the lower and upper right arm and shoulder. The episodes happen more in the evening. He denies injury to the neck or shoulder recently or remotely. Has not tried anything for it. No medication changes recently.   Review of Systems  Constitutional: Negative for activity change, appetite change, chills, fatigue, fever and unexpected weight change.  Respiratory: Negative.   Cardiovascular: Negative.   Gastrointestinal: Negative.   Musculoskeletal: Positive for arthralgias and myalgias. Negative for back pain, gait problem, joint swelling, neck pain and neck stiffness.  Skin: Negative.   Neurological: Positive for numbness. Negative for dizziness, tremors, seizures, syncope, facial asymmetry, speech difficulty, weakness, light-headedness and headaches.  Psychiatric/Behavioral: Negative.       Objective:   Physical Exam  Constitutional: He is oriented to person, place, and time. He appears well-developed and well-nourished.  HENT:  Head: Normocephalic and atraumatic.  Eyes: EOM are normal.  Neck: Normal range of motion.  Cardiovascular: Normal rate and regular rhythm.   Pulmonary/Chest: Effort normal and breath sounds normal.  Abdominal: Soft. He exhibits no distension. There is no tenderness.  Musculoskeletal: He exhibits no edema or tenderness.  Neurological: He is alert and oriented to person, place, and time. No cranial nerve deficit. Coordination normal.  Skin: Skin is warm and dry.  Psychiatric: He has a normal mood and affect.   Vitals:   12/31/15 1350  BP: 120/86  Pulse: 68  Temp: 98 F (36.7 C)    SpO2: 96%  Weight: 161 lb (73 kg)      Assessment & Plan:  Flu shot and prevnar 13 given at visit.

## 2016-01-01 ENCOUNTER — Ambulatory Visit
Admission: RE | Admit: 2016-01-01 | Discharge: 2016-01-01 | Disposition: A | Discharge status: Home / Self Care | Payer: Medicare Other | Source: Ambulatory Visit | Attending: Internal Medicine | Admitting: Internal Medicine

## 2016-01-01 DIAGNOSIS — R200 Anesthesia of skin: Secondary | ICD-10-CM

## 2016-02-10 ENCOUNTER — Ambulatory Visit: Payer: Self-pay | Admitting: Rheumatology

## 2016-02-10 ENCOUNTER — Ambulatory Visit (INDEPENDENT_AMBULATORY_CARE_PROVIDER_SITE_OTHER): Payer: Medicare Other | Admitting: Rheumatology

## 2016-02-10 DIAGNOSIS — M5030 Other cervical disc degeneration, unspecified cervical region: Secondary | ICD-10-CM

## 2016-02-10 DIAGNOSIS — R209 Unspecified disturbances of skin sensation: Secondary | ICD-10-CM

## 2016-02-23 ENCOUNTER — Ambulatory Visit: Payer: Medicare Other | Attending: Rheumatology

## 2016-02-23 DIAGNOSIS — M542 Cervicalgia: Secondary | ICD-10-CM | POA: Diagnosis present

## 2016-02-23 DIAGNOSIS — R293 Abnormal posture: Secondary | ICD-10-CM | POA: Diagnosis present

## 2016-02-23 NOTE — Therapy (Signed)
[facility] [address] [place], [place], [address] Phone: [phone]   Fax:  [phone]  Physical Therapy Evaluation  Patient Details  Name: Alan Wall MRN: [id number] Date of Birth: 1945/09/23 Referring Provider: Petra Kuba, MD  Encounter Date: 02/23/2016      PT End of Session - 02/23/16 1604    Visit Number 1   Number of Visits 10   Date for PT Re-Evaluation 04/19/16   PT Start Time [id number]   PT Stop Time 1605   PT Time Calculation (min) 35 min   Activity Tolerance Patient tolerated treatment well      Past Medical History:  Diagnosis Date  . Allergy    Food/cat dander/bee stings  . Hematoma of leg   . Hyperlipidemia   . S/P dilatation of esophageal stricture     Past Surgical History:  Procedure Laterality Date  . CATARACT EXTRACTION W/ INTRAOCULAR LENS  IMPLANT, BILATERAL  2010    There were no vitals filed for this visit.       Subjective Assessment - 02/23/16 1530    Subjective Pt presents to PT with complaints of Rt UE radiculopathy (numbness) that began ~3 weeks ago without cause.  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     Patient Stated Goals reduce Rt UE radiculopathy, use Rt UE without pain   Currently in Pain? Yes   Pain Score 1    Pain Location Arm   Pain Orientation Right   Pain Descriptors / Indicators Tingling;Numbness   Pain Type Neuropathic pain   Pain Onset 1 to 4 weeks ago   Pain Frequency Intermittent   Aggravating Factors  flexion of head, use of Rt UE   Pain Relieving Factors not using Rt UE, neutral posture            [facility] PT Assessment - 02/23/16 0001      Assessment   Medical Diagnosis DDD c-spine   Referring Provider Petra Kuba, MD   Onset  Date/Surgical Date 02/02/16   Hand Dominance Right   Next MD Visit none   Prior Therapy none     Precautions   Precautions None     Restrictions   Weight Bearing Restrictions No     Balance Screen   Has the patient fallen in the past 6 months No   Has the patient had a decrease in activity level because of a fear of falling?  No   Is the patient reluctant to leave their home because of a fear of falling?  No     Home Ecologist residence   Living Arrangements Spouse/significant other     Prior Function   Level of [place] Retired   Leisure bike riding, gym exercises      Cognition   Overall Cognitive Status Within Functional Limits for tasks assessed     Observation/Other Assessments   Focus on Therapeutic Outcomes (FOTO)  37% limitation     Posture/Postural Control   Posture/Postural Control Postural limitations   Postural Limitations Rounded Shoulders;Forward head     ROM / Strength   AROM / PROM / Strength AROM;PROM;Strength     AROM   Overall AROM  Deficits  Overall AROM Comments Cervical AROM: Rt neck tension/stiffness reported with Lt sided motion and motion is limited by 10% vs the Rt.  UE AROM is full.       PROM   Overall PROM  Within functional limits for tasks performed     Strength   Overall Strength Within functional limits for tasks performed   Overall Strength Comments Rt=Lt UE strength 4+/5 to 5/5     Palpation   Spinal mobility reduced by 25% in the cervcial and thoracic spine without apin   Palpation comment active trigger point in Rt UT, no tenderness to palpation today     Ambulation/Gait   Ambulation/Gait Yes   Ambulation/Gait Assistance 7: Independent   Ambulation Distance (Feet) 100 Feet   Gait Pattern Within Functional Limits                           PT Education - 02/23/16 1559    Education provided Yes   Education Details cervical AROM, chin tucks,  posture   Person(s) Educated Patient   Methods Explanation;Demonstration;Handout   Comprehension Verbalized understanding;Returned demonstration          PT Short Term Goals - 02/23/16 1609      PT SHORT TERM GOAL #1   Title be independent in initial HEP   Time 4   Period Weeks   Status New     PT SHORT TERM GOAL #2   Title report a 30% reduction in Rt UE radiculopathy with ADLs and self-care   Time 4   Period Weeks   Status New     PT SHORT TERM GOAL #3   Title demonstrate and verbalize correct seated posture and report corrections throughout the day   Time 4   Period Weeks   Status New           PT Long Term Goals - 02/23/16 1523      PT LONG TERM GOAL #1   Title be independent in advanced HEP   Time 8   Period Weeks   Status New     PT LONG TERM GOAL #2   Title reduce FOTO to < or = to 28% limitation   Time 8   Period Weeks   Status New     PT LONG TERM GOAL #3   Title report a 60% reduction in UE radiculopathy with self-care and ADLs   Time 8   Period Weeks   Status New     PT LONG TERM GOAL #4   Title report no episodes of Rt UE weakness with use of Rt UE   Time 8   Period Weeks   Status New               Plan - 02/23/16 1605    Clinical Impression Statement Pt presents to PT with complaints of sudden onset of Rt UE radicuopathy with numbness, tingling nad weakness that began 3 weeks ago.  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  Pt reports that symptoms of Rt UE weakness have resolved and he now has intermittent numbness/tingling.  Pt demonstrates postural dysfuntion, limited cervical AROM to the Lt, reduced cervical mobility and trigger point in the Rt UT.  Pt will benefit from skilled PT for cervical traction, postural education and cervical AROM to improve posture and reduce UE symptoms.     Rehab Potential Good   PT Frequency 2x / week  PT Duration 8 weeks   PT Treatment/Interventions ADLs/Self Care Home  Management;Cryotherapy;Electrical Stimulation;Functional mobility training;Ultrasound;Moist Heat;Therapeutic activities;Traction;Therapeutic exercise;Neuromuscular re-education;Patient/family education;Passive range of motion;Manual techniques;Dry needling;Taping   PT Next Visit Plan cervical traction, postural strength, cervical retractions   Consulted and Agree with Plan of Care Patient      Patient will benefit from skilled therapeutic intervention in order to improve the following deficits and impairments:  Decreased range of motion, Impaired flexibility, Pain, Postural dysfunction, Impaired UE functional use  Visit Diagnosis: Cervicalgia - Plan: PT plan of care cert/re-cert  Abnormal posture - Plan: PT plan of care cert/re-cert      G-Codes - 0000000 1524    Functional Assessment Tool Used FOTO: 37% limitation   Functional Limitation Carrying, moving and handling objects   Carrying, Moving and Handling Objects Current Status [id number]) At least 20 percent but less than 40 percent impaired, limited or restricted   Carrying, Moving and Handling Objects Goal Status [id number]) At least 20 percent but less than 40 percent impaired, limited or restricted       Problem List Patient Active Problem List   Diagnosis Date Noted  . Right arm numbness 12/31/2015  . Need for prophylactic vaccination against Streptococcus pneumoniae (pneumococcus) 12/31/2015  . Neoplasm of uncertain behavior of skin 01/26/2015  . OSA on CPAP 01/25/2015  . CAD (coronary artery disease) 01/25/2015  . Well adult exam 05/20/2012  . Pruritus ani 02/13/2012  . Stress 02/13/2012  . Wound, open, forearm 01/26/2011  . Fall against object 01/25/2011  . Degloving injury 01/25/2011  . Flank pain, acute 01/25/2011  . Knee pain, acute 01/25/2011  . Concussion 01/25/2011  . Vertigo 12/13/2010  . INSOMNIA, PERSISTENT 11/23/2008  . ALLERGY, FOOD 11/23/2008  . GERD 04/16/2007  . ERECTILE DYSFUNCTION 04/16/2007  .  HYPERLIPIDEMIA 01/31/2007     Alan Wall, PT 02/23/16 4:14 PM  [facility] [address], [place], [place], [address] Phone: [phone]   Fax:  [phone]  Name: Alan Wall MRN: [id number] Date of Birth: Nov 08, 1945

## 2016-02-23 NOTE — Patient Instructions (Addendum)
PERFORM ALL EXERCISES GENTLY AND WITH GOOD POSTURE.    20 SECOND HOLD, 3 REPS TO EACH SIDE. 4-5 TIMES EACH DAY.   AROM: Neck Rotation   Turn head slowly to look over one shoulder, then the other.   AROM: Neck Flexion   Bend head forward.   AROM: Lateral Neck Flexion   Slowly tilt head toward one shoulder, then the other.   Axial Extension (Chin Tuck)    Pull chin in and lengthen back of neck. Hold __5 seconds while counting out loud. Repeat __10__ times. Do _many___ sessions per day.  http://gt2.exer.us/449   Copyright  VHI. All rights reserved.    Posture - Standing   Good posture is important. Avoid slouching and forward head thrust. Maintain curve in low back and align ears over shoulders, hips over ankles.  Pull your belly button in toward your back bone. Posture Tips DO: - stand tall and erect - keep chin tucked in - keep head and shoulders in alignment - check posture regularly in mirror or large window - pull head back against headrest in car seat;  Change your position often.  Sit with lumbar support. DON'T: - slouch or slump while watching TV or reading - sit, stand or lie in one position  for too long;  Sitting is especially hard on the spine so if you sit at a desk/use the computer, then stand up often! Copyright  VHI. All rights reserved.  Posture - Sitting  Sit upright, head facing forward. Try using a roll to support lower back. Keep shoulders relaxed, and avoid rounded back. Keep hips level with knees. Avoid crossing legs for long periods. Copyright  VHI. All rights reserved.  Chronic neck strain can develop because of poor posture and faulty work habits  Postural strain related to slumped sitting and forward head posture is a leading cause of headaches, neck and upper back pain  General strengthening and flexibility exercises are helpful in the treatment of neck pain.  Most importantly, you should learn to correct the posture that may be contributing  to chronic pain.   Change positions frequently  Change your work or home environment to improve posture and mechanics.    [facility] [address], [place], [place] [address] Phone # [phone] Fax [phone]

## 2016-02-24 ENCOUNTER — Ambulatory Visit: Payer: Medicare Other | Admitting: Physical Therapy

## 2016-02-24 ENCOUNTER — Encounter: Payer: Self-pay | Admitting: Physical Therapy

## 2016-02-24 DIAGNOSIS — M542 Cervicalgia: Secondary | ICD-10-CM

## 2016-02-24 DIAGNOSIS — R293 Abnormal posture: Secondary | ICD-10-CM

## 2016-02-24 NOTE — Patient Instructions (Signed)
(  Home) Retraction: Row - Bilateral (Anchor)    Facing anchor, arms reaching forward, pull hands toward stomach, pinching shoulder blades together. Repeat __20__ times per set. Do __2__ sets per session. Do ___3_ sessions per week. Use ____ lb weights.  Copyright  VHI. All rights reserved.    Copyright  VHI. All rights reserved.  (Home) Extension / Flexion (Assist)    Face anchor, right arm as far forward and up as is pain free. Pull arm down toward side. Repeat __20__ times per set. Do __2__ sets per session. Do __3__ sessions per week. Use ____ lb weights.  Copyright  VHI. All rights reserved.   Mikle Bosworth, PTA 02/24/16 9:30 AM  [facility] [address] [place], [place] [address] Phone # [phone] Fax [phone]

## 2016-02-24 NOTE — Therapy (Signed)
[facility] [address], [name], [place], [address] Phone: [phone]   Fax:  [phone]  Physical Therapy Treatment  Patient Details  Name: Alan Wall MRN: [id number] Date of Birth: 1946/01/09 Referring Provider: Petra Kuba, MD  Encounter Date: 02/24/2016      PT End of Session - 02/24/16 0902    Visit Number 2   Number of Visits 10   Date for PT Re-Evaluation 04/19/16   PT Start Time [id number]   PT Stop Time 0952   PT Time Calculation (min) 65 min   Activity Tolerance Patient tolerated treatment well      Past Medical History:  Diagnosis Date  . Allergy    Food/cat dander/bee stings  . Hematoma of leg   . Hyperlipidemia   . S/P dilatation of esophageal stricture     Past Surgical History:  Procedure Laterality Date  . CATARACT EXTRACTION W/ INTRAOCULAR LENS  IMPLANT, BILATERAL  2010    There were no vitals filed for this visit.      Subjective Assessment - 02/24/16 0850    Subjective Pt has done home exercises at home and feels good about them. Reports minimal pain today no numbness at the moment.    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     Patient Stated Goals reduce Rt UE radiculopathy, use Rt UE without pain   Currently in Pain? Yes   Pain Score 1    Pain Location Arm   Pain Orientation Right   Pain Descriptors / Indicators Tightness;Numbness   Pain Type Neuropathic pain   Pain Onset 1 to 4 weeks ago   Pain Frequency Intermittent                         [facility] Adult PT Treatment/Exercise - 02/24/16 0001      Exercises   Exercises Neck;Shoulder     Neck Exercises: Theraband   Shoulder Extension 20 reps;Red   Rows 20 reps;Red     Neck Exercises: Supine   Neck Retraction 10 reps   Shoulder ABduction 20 reps  Red tband on foam roller   Upper  Extremity D2 20 reps  Red Tband on foam roller   Other Supine Exercise External rotation  Red tband on foam roller     Neck Exercises: Prone   W Back 10 reps   Shoulder Extension 10 reps     Shoulder Exercises: Standing   Extension Strengthening;Both;20 reps  #25   Row Strengthening;Both;20 reps     Shoulder Exercises: Stretch   Other Shoulder Stretches Upper trap stretch     Modalities   Modalities Traction     Traction   Type of Traction Cervical   Min (lbs) 5   Max (lbs) 20   Hold Time 60   Rest Time 20   Time 15                PT Education - 02/24/16 0935    Education provided Yes   Education Details upper back strengthening   Person(s) Educated Patient   Methods Explanation;Demonstration;Handout   Comprehension Verbalized understanding          PT Short Term Goals - 02/23/16 1609      PT SHORT TERM GOAL #1   Title be independent in initial HEP   Time  4   Period Weeks   Status New     PT SHORT TERM GOAL #2   Title report a 30% reduction in Rt UE radiculopathy with ADLs and self-care   Time 4   Period Weeks   Status New     PT SHORT TERM GOAL #3   Title demonstrate and verbalize correct seated posture and report corrections throughout the day   Time 4   Period Weeks   Status New           PT Long Term Goals - 03/06/16 1523      PT LONG TERM GOAL #1   Title be independent in advanced HEP   Time 8   Period Weeks   Status New     PT LONG TERM GOAL #2   Title reduce FOTO to < or = to 28% limitation   Time 8   Period Weeks   Status New     PT LONG TERM GOAL #3   Title report a 60% reduction in UE radiculopathy with self-care and ADLs   Time 8   Period Weeks   Status New     PT LONG TERM GOAL #4   Title report no episodes of Rt UE weakness with use of Rt UE   Time 8   Period Weeks   Status New               Plan - 02/24/16 [id number]    Clinical Impression Statement Pt reports occasional numbness in Rt arm that comes  and goes. Pt bikes for activities and is progressing self to be able to tolerate 2 hour bike rides with group. Pt able to complete all upper back strenghtening exercises and discussed posture while biking. Pt will continue to benefit frm skilled therapy for postural training and flexibility.    Rehab Potential Good   PT Frequency 2x / week   PT Duration 8 weeks   PT Treatment/Interventions ADLs/Self Care Home Management;Cryotherapy;Electrical Stimulation;Functional mobility training;Ultrasound;Moist Heat;Therapeutic activities;Traction;Therapeutic exercise;Neuromuscular re-education;Patient/family education;Passive range of motion;Manual techniques;Dry needling;Taping   PT Next Visit Plan asses responce to cervical traction, postural strength, cervical retractions   Consulted and Agree with Plan of Care Patient      Patient will benefit from skilled therapeutic intervention in order to improve the following deficits and impairments:  Decreased range of motion, Impaired flexibility, Pain, Postural dysfunction, Impaired UE functional use  Visit Diagnosis: Cervicalgia  Abnormal posture       G-Codes - 03/06/2016 1524    Functional Assessment Tool Used FOTO: 37% limitation   Functional Limitation Carrying, moving and handling objects   Carrying, Moving and Handling Objects Current Status [id number]) At least 20 percent but less than 40 percent impaired, limited or restricted   Carrying, Moving and Handling Objects Goal Status [id number]) At least 20 percent but less than 40 percent impaired, limited or restricted      Problem List Patient Active Problem List   Diagnosis Date Noted  . Right arm numbness 12/31/2015  . Need for prophylactic vaccination against Streptococcus pneumoniae (pneumococcus) 12/31/2015  . Neoplasm of uncertain behavior of skin 01/26/2015  . OSA on CPAP 01/25/2015  . CAD (coronary artery disease) 01/25/2015  . Well adult exam 05/20/2012  . Pruritus ani 02/13/2012  .  Stress 02/13/2012  . Wound, open, forearm 01/26/2011  . Fall against object 01/25/2011  . Degloving injury 01/25/2011  . Flank pain, acute 01/25/2011  . Knee pain, acute 01/25/2011  . Concussion 01/25/2011  .  Vertigo 12/13/2010  . INSOMNIA, PERSISTENT 11/23/2008  . ALLERGY, FOOD 11/23/2008  . GERD 04/16/2007  . ERECTILE DYSFUNCTION 04/16/2007  . HYPERLIPIDEMIA 01/31/2007    Mikle Bosworth PTA 02/24/2016, 10:02 AM  [facility] [address], [place], [place], [address] Phone: [phone]   Fax:  [phone]  Name: JALONTAE BOLAN MRN: [id number] Date of Birth: [birth year]

## 2016-02-29 ENCOUNTER — Other Ambulatory Visit: Payer: Self-pay | Admitting: Internal Medicine

## 2016-03-01 ENCOUNTER — Ambulatory Visit: Payer: Medicare Other | Admitting: Physical Therapy

## 2016-03-01 ENCOUNTER — Encounter: Payer: Self-pay | Admitting: Physical Therapy

## 2016-03-01 DIAGNOSIS — R293 Abnormal posture: Secondary | ICD-10-CM

## 2016-03-01 DIAGNOSIS — M542 Cervicalgia: Secondary | ICD-10-CM | POA: Diagnosis not present

## 2016-03-01 NOTE — Patient Instructions (Addendum)
Medicine Ball: Diagonal Sweep    Squat, touching ball outside of right foot. Extend upward lifting ball over left shoulder. Repeat to other side. Do __10__ repetitions, _2___ sets.   http://bt.exer.us/92   Copyright  VHI. All rights reserved. (Home) PNF: Flexion Diagonal Pattern, Standing    Right side toward anchor, reach up and out to same side. Pull rope down and out to other side, bending knees. Repeat __10__ times per set. Do __2__ sets per session.  Chest Flexibility: Downward Dog Pose (Wall)    Standing with hands at shoulder height anchored on wall, step back into pose. Hold for __10__ seconds. Repeat __2__ times.  Copyright  VHI. All rights reserved.   Copyright  VHI. All rights reserved.  Mikle Bosworth, PTA 03/01/16 8:57 AM  [facility] [address], [place], [place] [address] Phone # [phone] Fax [phone]

## 2016-03-01 NOTE — Therapy (Signed)
[facility] [address] [place], [place], [address] Phone: [phone]   Fax:  [phone]  Physical Therapy Treatment  Patient Details  Name: Alan Wall MRN: [id number] Date of Birth: July 02, 1945 Referring Provider: Petra Kuba, MD  Encounter Date: 03/01/2016      PT End of Session - 03/01/16 0835    Visit Number 3   Number of Visits 10   Date for PT Re-Evaluation 04/19/16   PT Start Time 0834   PT Stop Time 0936   PT Time Calculation (min) 62 min   Activity Tolerance Patient tolerated treatment well      Past Medical History:  Diagnosis Date  . Allergy    Food/cat dander/bee stings  . Hematoma of leg   . Hyperlipidemia   . S/P dilatation of esophageal stricture     Past Surgical History:  Procedure Laterality Date  . CATARACT EXTRACTION W/ INTRAOCULAR LENS  IMPLANT, BILATERAL  2010    There were no vitals filed for this visit.      Subjective Assessment - 03/01/16 0837    Subjective Pt reports having some weakness with pronation with some elbow pain in Rt arm. Lt shoulder aching last night, thinking about changing pillow.    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     Patient Stated Goals reduce Rt UE radiculopathy, use Rt UE without pain   Currently in Pain? No/denies                         [facility] Adult PT Treatment/Exercise - 03/01/16 0001      Neck Exercises: Theraband   Shoulder Extension --   Rows --     Neck Exercises: Standing   Lift / Chop 10 reps  #15 lift and chop   Other Standing Exercises Serratus wall slides  20x VC for postition control     Neck Exercises: Supine   Upper Extremity D2 --  Red Tband on foam roller     Neck Exercises: Prone   W Back 10 reps   Shoulder Extension 10 reps   Other Prone Exercise Alt arm/leg  10x VC  for control     Shoulder Exercises: Standing   Flexion 5 reps  #5 flexion and then pronation Bil for comparisson      Shoulder Exercises: Stretch   Corner Stretch 2 reps   Wall Stretch - Flexion 10 seconds     Shoulder Exercises: Power Development worker, community 20 reps   Row 20 reps   External Rotation 20 reps   Internal Rotation 20 reps     Modalities   Modalities Traction     Traction   Type of Traction Cervical   Min (lbs) 5   Max (lbs) 20   Hold Time 60   Rest Time 20   Time 15                PT Education - 03/01/16 0857    Education provided Yes   Education Details back strengthening   Person(s) Educated Patient   Methods Explanation;Demonstration;Handout   Comprehension Verbalized understanding          PT Short Term Goals - 03/01/16 0917      PT SHORT TERM GOAL #1   Title be independent in initial HEP   Time 4  Period Weeks   Status On-going     PT SHORT TERM GOAL #2   Title report a 30% reduction in Rt UE radiculopathy with ADLs and self-care   Time 4   Period Weeks   Status On-going     PT SHORT TERM GOAL #3   Title demonstrate and verbalize correct seated posture and report corrections throughout the day   Time 4   Period Weeks   Status On-going           PT Long Term Goals - 03/01/16 [id number]      PT LONG TERM GOAL #1   Title be independent in advanced HEP   Time 8   Period Weeks   Status On-going     PT LONG TERM GOAL #2   Title reduce FOTO to < or = to 28% limitation   Time 8   Period Weeks   Status On-going     PT LONG TERM GOAL #3   Title report a 60% reduction in UE radiculopathy with self-care and ADLs   Time 8   Period Weeks   Status On-going     PT LONG TERM GOAL #4   Title report no episodes of Rt UE weakness with use of Rt UE   Time 8   Period Weeks   Status On-going               Plan - 03/01/16 0918    Clinical Impression Statement Pt has some assemetric GH stability in Rt shoulder evident with  shoulder flexion with 5# weight and pronation. Pt tolerated all strengthening exercises well. Will continue to do traction once per week or as needed.  Pt will continue to progress with strengthening and flexibilty to return to prior level of function without pain.    Rehab Potential Good   PT Frequency 2x / week   PT Duration 8 weeks   PT Treatment/Interventions ADLs/Self Care Home Management;Cryotherapy;Electrical Stimulation;Functional mobility training;Ultrasound;Moist Heat;Therapeutic activities;Traction;Therapeutic exercise;Neuromuscular re-education;Patient/family education;Passive range of motion;Manual techniques;Dry needling;Taping   PT Next Visit Plan Continue to strengthen back and postural muscles   Consulted and Agree with Plan of Care Patient      Patient will benefit from skilled therapeutic intervention in order to improve the following deficits and impairments:  Decreased range of motion, Impaired flexibility, Pain, Postural dysfunction, Impaired UE functional use  Visit Diagnosis: Cervicalgia  Abnormal posture     Problem List Patient Active Problem List   Diagnosis Date Noted  . Right arm numbness 12/31/2015  . Need for prophylactic vaccination against Streptococcus pneumoniae (pneumococcus) 12/31/2015  . Neoplasm of uncertain behavior of skin 01/26/2015  . OSA on CPAP 01/25/2015  . CAD (coronary artery disease) 01/25/2015  . Well adult exam 05/20/2012  . Pruritus ani 02/13/2012  . Stress 02/13/2012  . Wound, open, forearm 01/26/2011  . Fall against object 01/25/2011  . Degloving injury 01/25/2011  . Flank pain, acute 01/25/2011  . Knee pain, acute 01/25/2011  . Concussion 01/25/2011  . Vertigo 12/13/2010  . INSOMNIA, PERSISTENT 11/23/2008  . ALLERGY, FOOD 11/23/2008  . GERD 04/16/2007  . ERECTILE DYSFUNCTION 04/16/2007  . HYPERLIPIDEMIA 01/31/2007    Mikle Bosworth PTA 03/01/2016, 9:42 AM  [facility] [address], [place], [place], [address] Phone: [phone]   Fax:  [phone]  Name: TAEDYN MONTPETIT MRN: [id number] Date of Birth: 1945-11-06

## 2016-03-08 ENCOUNTER — Ambulatory Visit: Payer: Medicare Other | Admitting: Physical Therapy

## 2016-03-08 DIAGNOSIS — M542 Cervicalgia: Secondary | ICD-10-CM | POA: Diagnosis not present

## 2016-03-08 DIAGNOSIS — R293 Abnormal posture: Secondary | ICD-10-CM

## 2016-03-08 NOTE — Therapy (Signed)
[facility] [address], [place], [place], [address] Phone: [phone]   Fax:  [phone]  Physical Therapy Treatment  Patient Details  Name: Alan Wall MRN: [id number] Date of Birth: 1946-01-21 Referring Provider: Petra Kuba, MD  Encounter Date: 03/08/2016      PT End of Session - 03/08/16 0836    Visit Number 4   Number of Visits 10   Date for PT Re-Evaluation 04/19/16   PT Start Time 0833   PT Stop Time 0923   PT Time Calculation (min) 50 min   Activity Tolerance Patient tolerated treatment well      Past Medical History:  Diagnosis Date  . Allergy    Food/cat dander/bee stings  . Hematoma of leg   . Hyperlipidemia   . S/P dilatation of esophageal stricture     Past Surgical History:  Procedure Laterality Date  . CATARACT EXTRACTION W/ INTRAOCULAR LENS  IMPLANT, BILATERAL  2010    There were no vitals filed for this visit.      Subjective Assessment - 03/08/16 0835    Subjective Pt reports pain feeling better over all. Last week had pain in neck and shoulders.    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     Patient Stated Goals reduce Rt UE radiculopathy, use Rt UE without pain   Currently in Pain? No/denies   Pain Onset 1 to 4 weeks ago   Aggravating Factors  FLexion of head, use of Rt UE   Pain Relieving Factors Not useing Rt UE, neutral position                         [facility] Adult PT Treatment/Exercise - 03/08/16 0001      Neck Exercises: Machines for Strengthening   UBE (Upper Arm Bike) 4x4 L3  Therapist present to discuss treatment     Neck Exercises: Standing   Lift / Chop 10 reps  #15 lift and chop   Other Standing Exercises Serratus wall slides  20x VC for postition control     Neck Exercises: Prone   W Back 10 reps   Shoulder  Extension 10 reps   Other Prone Exercise Alt arm/leg  10x VC for control     Shoulder Exercises: Standing   Flexion Strengthening;Both  #3 3x10   ABduction Strengthening;Both  #3 3x10   Other Standing Exercises Scaption #3 3x10     Shoulder Exercises: Stretch   Corner Stretch 2 reps   Wall Stretch - Flexion 10 seconds     Shoulder Exercises: Power Agricultural engineer 20 reps   External Rotation 20 reps   Internal Rotation 20 reps                PT Education - 03/08/16 [phone]    Education provided Yes   Education Details shoulder strenghtening   Person(s) Educated Patient   Methods Explanation;Demonstration;Handout   Comprehension Verbalized understanding          PT Short Term Goals - 03/08/16 0836      PT SHORT TERM GOAL #1   Title be independent in initial HEP   Time 4   Period Weeks   Status Achieved     PT SHORT TERM GOAL #2   Title report a 30% reduction in  Rt UE radiculopathy with ADLs and self-care   Time 4   Period Weeks   Status Achieved  80%     PT SHORT TERM GOAL #3   Title demonstrate and verbalize correct seated posture and report corrections throughout the day   Time 4   Period Weeks   Status Achieved           PT Long Term Goals - 03/08/16 [id number]      PT LONG TERM GOAL #1   Title be independent in advanced HEP   Time 8   Period Weeks   Status On-going     PT LONG TERM GOAL #2   Title reduce FOTO to < or = to 28% limitation   Time 8   Period Weeks   Status On-going     PT LONG TERM GOAL #4   Title report no episodes of Rt UE weakness with use of Rt UE   Time 8   Period Weeks   Status On-going               Plan - 03/08/16 [id number]    Clinical Impression Statement Pt able to tolerate all strengthening exercises well. Continues to progress with GH stability and symetry. Pt reports 80% reduction in symptoms overall. Pt is planning to continue exercises after therapy and is plannig to look into Silver  Sneakers. Pt will continue to benefit from skilled therapy to continue to progress towards long term goals.    Rehab Potential Good   PT Frequency 2x / week   PT Duration 8 weeks   PT Treatment/Interventions ADLs/Self Care Home Management;Cryotherapy;Electrical Stimulation;Functional mobility training;Ultrasound;Moist Heat;Therapeutic activities;Traction;Therapeutic exercise;Neuromuscular re-education;Patient/family education;Passive range of motion;Manual techniques;Dry needling;Taping   PT Next Visit Plan Continue to strengthen back and postural muscles   Consulted and Agree with Plan of Care Patient      Patient will benefit from skilled therapeutic intervention in order to improve the following deficits and impairments:  Decreased range of motion, Impaired flexibility, Pain, Postural dysfunction, Impaired UE functional use  Visit Diagnosis: Cervicalgia  Abnormal posture     Problem List Patient Active Problem List   Diagnosis Date Noted  . Right arm numbness 12/31/2015  . Need for prophylactic vaccination against Streptococcus pneumoniae (pneumococcus) 12/31/2015  . Neoplasm of uncertain behavior of skin 01/26/2015  . OSA on CPAP 01/25/2015  . CAD (coronary artery disease) 01/25/2015  . Well adult exam 05/20/2012  . Pruritus ani 02/13/2012  . Stress 02/13/2012  . Wound, open, forearm 01/26/2011  . Fall against object 01/25/2011  . Degloving injury 01/25/2011  . Flank pain, acute 01/25/2011  . Knee pain, acute 01/25/2011  . Concussion 01/25/2011  . Vertigo 12/13/2010  . INSOMNIA, PERSISTENT 11/23/2008  . ALLERGY, FOOD 11/23/2008  . GERD 04/16/2007  . ERECTILE DYSFUNCTION 04/16/2007  . HYPERLIPIDEMIA 01/31/2007    Mikle Bosworth PTA 03/08/2016, 9:30 AM  [facility] [address] [place], [place], [address] Phone: [phone]   Fax:  [phone]  Name: Alan Wall MRN: [id number] Date of Birth:  [birth year]

## 2016-03-08 NOTE — Patient Instructions (Signed)
All Fours Push-Up With Press-Up    On all fours with hands shoulder-width apart, bend elbows and perform push-up. Return and press shoulders up, arching upper back. Return. Repeat __10__ times, do _2_ sets.   http://cc.exer.us/63   Copyright  VHI. All rights reserved.   Flexion: Palm Down (Dumbell)    Lift arms from thighs to shoulder height, palms down. Repeat __10__ times per set. Do __3__ sets per session.  Copyright  VHI. All rights reserved.  Dumbbell Lateral Raise    Sit holding ___ lb dumbbells. Bring arms out from sides until parallel to floor. Do _3__ sets of __10_ repetitions. Advanced: Do with alternating arms.  Copyright  VHI. All rights reserved.   Jeanie Sewer PTA [facility] [address], [place], [place] [address] Phone # [phone] Fax [phone]

## 2016-03-10 ENCOUNTER — Ambulatory Visit: Payer: Medicare Other | Admitting: Physical Therapy

## 2016-03-10 DIAGNOSIS — M542 Cervicalgia: Secondary | ICD-10-CM

## 2016-03-10 DIAGNOSIS — R293 Abnormal posture: Secondary | ICD-10-CM

## 2016-03-10 NOTE — Therapy (Signed)
[facility] [address] [place], [place], [address] Phone: [phone]   Fax:  [phone]  Physical Therapy Treatment  Patient Details  Name: Alan Wall MRN: [id number] Date of Birth: 10-13-1945 Referring Provider: Petra Kuba, MD  Encounter Date: 03/10/2016      PT End of Session - 03/10/16 1122    Visit Number 5   Number of Visits 10   Date for PT Re-Evaluation 04/19/16   PT Start Time 0845   PT Stop Time 0930   PT Time Calculation (min) 45 min   Activity Tolerance Patient tolerated treatment well      Past Medical History:  Diagnosis Date  . Allergy    Food/cat dander/bee stings  . Hematoma of leg   . Hyperlipidemia   . S/P dilatation of esophageal stricture     Past Surgical History:  Procedure Laterality Date  . CATARACT EXTRACTION W/ INTRAOCULAR LENS  IMPLANT, BILATERAL  2010    There were no vitals filed for this visit.      Subjective Assessment - 03/10/16 0850    Subjective Last had neck or arm pain 1 week ago following traction.  Doing well.  Rode his bike 70 miles yesterday with only mild discomfort in his right hand from changing gears.     Currently in Pain? Yes   Pain Score 1    Pain Location Hand   Pain Orientation Right                         [facility] Adult PT Treatment/Exercise - 03/10/16 0001      Neck Exercises: Standing   Other Standing Exercises wall push ups plus 20x     Shoulder Exercises: Prone   Extension AROM;Right;Left;15 reps   Horizontal ABduction 1 AROM;Right;15 reps   Horizontal ABduction 2 AROM;Right;15 reps     Shoulder Exercises: Standing   Other Standing Exercises back against wall deep flexor nods 10x   Other Standing Exercises wall flexor nod nod with UE flex and snowangels     Shoulder Exercises: ROM/Strengthening   Other ROM/Strengthening Exercises wall push ups plus 20x     Shoulder Exercises: Power Tower   Extension  20 reps   Extension Limitations 30#   Row 20 reps   Row Limitations 30#   Other Power Tower Exercises 30# right arm D2 extension diagonals 20x each                PT Education - 03/10/16 [phone]    Education provided Yes   Education Details prone Is, Ts, Ys   Person(s) Educated Patient   Methods Explanation;Demonstration;Handout   Comprehension Verbalized understanding;Returned demonstration          PT Short Term Goals - 03/10/16 1121      PT SHORT TERM GOAL #1   Title be independent in initial HEP   Status Achieved     PT SHORT TERM GOAL #2   Title report a 30% reduction in Rt UE radiculopathy with ADLs and self-care   Status Achieved     PT SHORT TERM GOAL #3   Title demonstrate and verbalize correct seated posture and report corrections throughout the day   Status Achieved           PT Long Term Goals - 03/10/16 1121      PT LONG TERM GOAL #1   Title be independent in advanced HEP   Time 8  Period Weeks   Status On-going     PT LONG TERM GOAL #2   Title reduce FOTO to < or = to 28% limitation   Time 8   Period Weeks   Status On-going     PT LONG TERM GOAL #3   Title report a 60% reduction in UE radiculopathy with self-care and ADLs   Time 8   Period Weeks   Status On-going     PT LONG TERM GOAL #4   Title report no episodes of Rt UE weakness with use of Rt UE   Time 8   Period Weeks   Status On-going               Plan - 03/10/16 1117    Clinical Impression Statement The patient is progressing very well with pain reduction and centralization (no peripheral symptoms) in 1 week.  He has been able to return to riding his bike for long distances.  He is able to participate in postural strengthening including activation of deep cervical flexors and scapular stabilizers without pain exacerbation.  The patient is very receptive to education.  Therapist providing verbal and tactile cues for muscle activation and to monitor symptoms.     PT  Next Visit Plan Continue to strengthen back and postural muscles, including scapular stabilizers      Patient will benefit from skilled therapeutic intervention in order to improve the following deficits and impairments:     Visit Diagnosis: Cervicalgia  Abnormal posture     Problem List Patient Active Problem List   Diagnosis Date Noted  . Right arm numbness 12/31/2015  . Need for prophylactic vaccination against Streptococcus pneumoniae (pneumococcus) 12/31/2015  . Neoplasm of uncertain behavior of skin 01/26/2015  . OSA on CPAP 01/25/2015  . CAD (coronary artery disease) 01/25/2015  . Well adult exam 05/20/2012  . Pruritus ani 02/13/2012  . Stress 02/13/2012  . Wound, open, forearm 01/26/2011  . Fall against object 01/25/2011  . Degloving injury 01/25/2011  . Flank pain, acute 01/25/2011  . Knee pain, acute 01/25/2011  . Concussion 01/25/2011  . Vertigo 12/13/2010  . INSOMNIA, PERSISTENT 11/23/2008  . ALLERGY, FOOD 11/23/2008  . GERD 04/16/2007  . ERECTILE DYSFUNCTION 04/16/2007  . HYPERLIPIDEMIA 01/31/2007   Ruben Im, PT 03/10/16 11:23 AM Phone: [phone] Fax: [phone]  Alvera Singh 03/10/2016, 11:23 AM  [facility] [address], [place], [place], [address] Phone: [phone]   Fax:  [phone]  Name: Alan Wall MRN: [id number] Date of Birth: 1945-08-16

## 2016-03-10 NOTE — Patient Instructions (Signed)
Reg Bircher PT [facility] [address], [address] [place], [place] [address] Phone # [phone] Fax [phone]    

## 2016-03-10 NOTE — Therapy (Signed)
[facility] [address], [place], [place], [address] Phone: [phone]   Fax:  [phone]  Physical Therapy Treatment  Patient Details  Name: Alan Wall MRN: [id number] Date of Birth: Oct 09, 1945 Referring Provider: Petra Kuba, MD  Encounter Date: 03/10/2016    Past Medical History:  Diagnosis Date  . Allergy    Food/cat dander/bee stings  . Hematoma of leg   . Hyperlipidemia   . S/P dilatation of esophageal stricture     Past Surgical History:  Procedure Laterality Date  . CATARACT EXTRACTION W/ INTRAOCULAR LENS  IMPLANT, BILATERAL  2010    There were no vitals filed for this visit.      Subjective Assessment - 03/10/16 0850    Subjective Last had neck or arm pain 1 week ago following traction.  Doing well.  Rode his bike 70 miles yesterday with only mild discomfort in his right hand from changing gears.     Currently in Pain? Yes   Pain Score 1    Pain Location Hand   Pain Orientation Right                         [facility] Adult PT Treatment/Exercise - 03/10/16 0001      Neck Exercises: Standing   Other Standing Exercises wall push ups plus 20x     Shoulder Exercises: Prone   Extension AROM;Right;Left;15 reps   Horizontal ABduction 1 AROM;Right;15 reps   Horizontal ABduction 2 AROM;Right;15 reps     Shoulder Exercises: Standing   Other Standing Exercises back against wall deep flexor nods 10x   Other Standing Exercises wall flexor nod nod with UE flex and snowangels     Shoulder Exercises: ROM/Strengthening   Other ROM/Strengthening Exercises wall push ups plus 20x     Shoulder Exercises: Power Tower   Extension 20 reps   Extension Limitations 30#   Row 20 reps   Row Limitations 30#   Other Power Tower Exercises 30# right arm D2 extension diagonals 20x each                PT Education - 03/10/16 [phone]    Education provided Yes   Education Details  prone Is, Ts, Ys   Person(s) Educated Patient   Methods Explanation;Demonstration;Handout   Comprehension Verbalized understanding;Returned demonstration          PT Short Term Goals - 03/10/16 1121      PT SHORT TERM GOAL #1   Title be independent in initial HEP   Status Achieved     PT SHORT TERM GOAL #2   Title report a 30% reduction in Rt UE radiculopathy with ADLs and self-care   Status Achieved     PT SHORT TERM GOAL #3   Title demonstrate and verbalize correct seated posture and report corrections throughout the day   Status Achieved           PT Long Term Goals - 03/10/16 1121      PT LONG TERM GOAL #1   Title be independent in advanced HEP   Time 8   Period Weeks   Status On-going     PT LONG TERM GOAL #2   Title reduce FOTO to < or = to 28% limitation   Time 8   Period Weeks   Status On-going     PT LONG TERM GOAL #3   Title report a 60% reduction in UE radiculopathy with self-care  and ADLs   Time 8   Period Weeks   Status On-going     PT LONG TERM GOAL #4   Title report no episodes of Rt UE weakness with use of Rt UE   Time 8   Period Weeks   Status On-going               Plan - 03/10/16 1117    Clinical Impression Statement The patient is progressing very well with pain reduction and centralization (no peripheral symptoms) in 1 week.  He has been able to return to riding his bike for long distances.  He is able to participate in postural strengthening including activation of deep cervical flexors and scapular stabilizers without pain exacerbation.  The patient is very receptive to education.  Therapist providing verbal and tactile cues for muscle activation and to monitor symptoms.     PT Next Visit Plan Continue to strengthen back and postural muscles, including scapular stabilizers      Patient will benefit from skilled therapeutic intervention in order to improve the following deficits and impairments:     Visit  Diagnosis: Cervicalgia  Abnormal posture     Problem List Patient Active Problem List   Diagnosis Date Noted  . Right arm numbness 12/31/2015  . Need for prophylactic vaccination against Streptococcus pneumoniae (pneumococcus) 12/31/2015  . Neoplasm of uncertain behavior of skin 01/26/2015  . OSA on CPAP 01/25/2015  . CAD (coronary artery disease) 01/25/2015  . Well adult exam 05/20/2012  . Pruritus ani 02/13/2012  . Stress 02/13/2012  . Wound, open, forearm 01/26/2011  . Fall against object 01/25/2011  . Degloving injury 01/25/2011  . Flank pain, acute 01/25/2011  . Knee pain, acute 01/25/2011  . Concussion 01/25/2011  . Vertigo 12/13/2010  . INSOMNIA, PERSISTENT 11/23/2008  . ALLERGY, FOOD 11/23/2008  . GERD 04/16/2007  . ERECTILE DYSFUNCTION 04/16/2007  . HYPERLIPIDEMIA 01/31/2007    Alvera Singh 03/10/2016, 11:22 AM  [facility] [address], [place], [place], [address] Phone: [phone]   Fax:  [phone]  Name: DAIDEN KADAR MRN: [id number] Date of Birth: [birth year]

## 2016-03-13 ENCOUNTER — Ambulatory Visit: Payer: Medicare Other | Admitting: Physical Therapy

## 2016-03-13 ENCOUNTER — Other Ambulatory Visit: Payer: Self-pay | Admitting: Internal Medicine

## 2016-03-13 ENCOUNTER — Encounter: Payer: Self-pay | Admitting: Physical Therapy

## 2016-03-13 DIAGNOSIS — M542 Cervicalgia: Secondary | ICD-10-CM | POA: Diagnosis not present

## 2016-03-13 DIAGNOSIS — R293 Abnormal posture: Secondary | ICD-10-CM

## 2016-03-13 NOTE — Therapy (Signed)
[facility] [address] [place], [place], [address] Phone: [phone]   Fax:  [phone]  Physical Therapy Treatment  Patient Details  Name: Alan Wall MRN: [id number] Date of Birth: [birth year] Referring Provider: Petra Kuba, MD  Encounter Date: 03/13/2016      PT End of Session - 03/13/16 0845    Visit Number 6   Number of Visits 10   Date for PT Re-Evaluation 04/19/16   PT Start Time 0843   PT Stop Time 0925   PT Time Calculation (min) 42 min   Activity Tolerance Patient tolerated treatment well   Behavior During Therapy [facility] for tasks assessed/performed      Past Medical History:  Diagnosis Date  . Allergy    Food/cat dander/bee stings  . Hematoma of leg   . Hyperlipidemia   . S/P dilatation of esophageal stricture     Past Surgical History:  Procedure Laterality Date  . CATARACT EXTRACTION W/ INTRAOCULAR LENS  IMPLANT, BILATERAL  2010    There were no vitals filed for this visit.      Subjective Assessment - 03/13/16 0844    Subjective Pt reports everything feeling good today.    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     Patient Stated Goals reduce Rt UE radiculopathy, use Rt UE without pain   Currently in Pain? No/denies                         [facility] Adult PT Treatment/Exercise - 03/13/16 0001      Neck Exercises: Machines for Strengthening   UBE (Upper Arm Bike) 4x4 L3  Therapist present to discuss treatment     Neck Exercises: Prone   W Back --   Shoulder Extension --   Other Prone Exercise --     Shoulder Exercises: Prone   Extension AROM;Right;Left;15 reps  #3 3x10   Horizontal ABduction 1 AROM;Right;15 reps  #3 3x10   Horizontal ABduction 2 AROM;Right;15 reps  #3 3x10     Shoulder Exercises: Standing   Other Standing Exercises  back against wall deep flexor nods 10x   Other Standing Exercises wall flexor nod nod with UE flex and snowangels     Shoulder Exercises: Power Development worker, community 20 reps   Extension Limitations #35   Row 20 reps   Row Limitations #35   External Rotation 20 reps   Internal Rotation 20 reps   Other Power Tower Exercises 30# right arm D2 extension diagonals 20x each   Other Power Tower Exercises triceps pull 3x10                  PT Short Term Goals - 03/13/16 0846      PT SHORT TERM GOAL #3   Title demonstrate and verbalize correct seated posture and report corrections throughout the day   Time 4   Period Weeks   Status Achieved           PT Long Term Goals - 03/13/16 0846      PT LONG TERM GOAL #1   Title be independent in advanced HEP   Time 8   Period Weeks   Status On-going     PT LONG TERM GOAL #2   Title reduce FOTO to < or = to 28% limitation  Time 8   Period Weeks   Status On-going     PT LONG TERM GOAL #3   Title report a 60% reduction in UE radiculopathy with self-care and ADLs   Time 8   Period Weeks   Status On-going     PT LONG TERM GOAL #4   Title report no episodes of Rt UE weakness with use of Rt UE   Time 8   Period Weeks   Status On-going               Plan - 03/13/16 0900    Clinical Impression Statement Pt progressing well with strengthening and reports less frequency of symptoms. Pt will self monitor this week to see if numbness comes back with riding bike. Pt will continue to benefit from skilled therapy for UE and postural strengthening and progression towards long term goals.    Rehab Potential Good   PT Frequency 2x / week   PT Duration 8 weeks   PT Treatment/Interventions ADLs/Self Care Home Management;Cryotherapy;Electrical Stimulation;Functional mobility training;Ultrasound;Moist Heat;Therapeutic activities;Traction;Therapeutic exercise;Neuromuscular re-education;Patient/family education;Passive range of  motion;Manual techniques;Dry needling;Taping   PT Next Visit Plan Continue to strengthen back and postural muscles, including scapular stabilizers   Consulted and Agree with Plan of Care Patient      Patient will benefit from skilled therapeutic intervention in order to improve the following deficits and impairments:  Decreased range of motion, Impaired flexibility, Pain, Postural dysfunction, Impaired UE functional use  Visit Diagnosis: Cervicalgia  Abnormal posture     Problem List Patient Active Problem List   Diagnosis Date Noted  . Right arm numbness 12/31/2015  . Need for prophylactic vaccination against Streptococcus pneumoniae (pneumococcus) 12/31/2015  . Neoplasm of uncertain behavior of skin 01/26/2015  . OSA on CPAP 01/25/2015  . CAD (coronary artery disease) 01/25/2015  . Well adult exam 05/20/2012  . Pruritus ani 02/13/2012  . Stress 02/13/2012  . Wound, open, forearm 01/26/2011  . Fall against object 01/25/2011  . Degloving injury 01/25/2011  . Flank pain, acute 01/25/2011  . Knee pain, acute 01/25/2011  . Concussion 01/25/2011  . Vertigo 12/13/2010  . INSOMNIA, PERSISTENT 11/23/2008  . ALLERGY, FOOD 11/23/2008  . GERD 04/16/2007  . ERECTILE DYSFUNCTION 04/16/2007  . HYPERLIPIDEMIA 01/31/2007    Mikle Bosworth PTA 03/13/2016, 9:22 AM  [facility] [address], [place], [place], [address] Phone: [phone]   Fax:  [phone]  Name: Alan Wall MRN: [id number] Date of Birth: [birth year]

## 2016-03-15 ENCOUNTER — Ambulatory Visit: Payer: Medicare Other | Admitting: Physical Therapy

## 2016-03-15 ENCOUNTER — Encounter: Payer: Self-pay | Admitting: Physical Therapy

## 2016-03-15 DIAGNOSIS — M542 Cervicalgia: Secondary | ICD-10-CM

## 2016-03-15 DIAGNOSIS — R293 Abnormal posture: Secondary | ICD-10-CM

## 2016-03-15 NOTE — Therapy (Signed)
[facility] [address] [place], [place], [address] Phone: [phone]   Fax:  [phone]  Physical Therapy Treatment  Patient Details  Name: Alan Wall MRN: [id number] Date of Birth: 08-11-1945 Referring Provider: Petra Kuba, MD  Encounter Date: 03/15/2016      PT End of Session - 03/15/16 0851    Visit Number 7   Number of Visits 10   Date for PT Re-Evaluation 04/19/16   PT Start Time 0845   PT Stop Time 0928   PT Time Calculation (min) 43 min   Activity Tolerance Patient tolerated treatment well   Behavior During Therapy [facility] for tasks assessed/performed      Past Medical History:  Diagnosis Date  . Allergy    Food/cat dander/bee stings  . Hematoma of leg   . Hyperlipidemia   . S/P dilatation of esophageal stricture     Past Surgical History:  Procedure Laterality Date  . CATARACT EXTRACTION W/ INTRAOCULAR LENS  IMPLANT, BILATERAL  2010    There were no vitals filed for this visit.      Subjective Assessment - 03/15/16 0849    Subjective Pt reports feeling like shoulder is getting better, not getting worse. Pain is intermittent.    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     Patient Stated Goals reduce Rt UE radiculopathy, use Rt UE without pain   Currently in Pain? No/denies                         [facility] Adult PT Treatment/Exercise - 03/15/16 0001      Neck Exercises: Machines for Strengthening   UBE (Upper Arm Bike) 4x4 L3  Therapist present to discuss treatment     Shoulder Exercises: Prone   Extension AROM;Right;Left;15 reps  #3 3x10   Horizontal ABduction 1 AROM;Right;15 reps  #3 3x10   Horizontal ABduction 2 AROM;Right;15 reps  #3 3x10     Shoulder Exercises: Standing   Other Standing Exercises back against wall deep flexor nods 10x     Shoulder Exercises: Power Designer, multimedia Limitations #35   Row 20 reps   Row Limitations #45   External Rotation 20 reps   Internal Rotation 20 reps   Other Power Tower Exercises 30# right arm D2 extension diagonals 20x each   Other Power Tower Exercises triceps pull 3x10                  PT Short Term Goals - 03/13/16 0846      PT SHORT TERM GOAL #3   Title demonstrate and verbalize correct seated posture and report corrections throughout the day   Time 4   Period Weeks   Status Achieved           PT Long Term Goals - 03/13/16 0846      PT LONG TERM GOAL #1   Title be independent in advanced HEP   Time 8   Period Weeks   Status On-going     PT LONG TERM GOAL #2   Title reduce FOTO to < or = to 28% limitation   Time 8   Period Weeks   Status On-going     PT LONG TERM GOAL #3   Title report a 60% reduction in UE radiculopathy with  self-care and ADLs   Time 8   Period Weeks   Status On-going     PT LONG TERM GOAL #4   Title report no episodes of Rt UE weakness with use of Rt UE   Time 8   Period Weeks   Status On-going               Plan - 03/15/16 0906    Clinical Impression Statement Pt continues to progress well with strengthening exercises. Needing some verbal cues to use back and shoulder muscles rather than torque to complete exercises. Pt will continue to benefit from skilled therapy for strengthening for ADL's.    Rehab Potential Good   PT Frequency 2x / week   PT Duration 8 weeks   PT Treatment/Interventions ADLs/Self Care Home Management;Cryotherapy;Electrical Stimulation;Functional mobility training;Ultrasound;Moist Heat;Therapeutic activities;Traction;Therapeutic exercise;Neuromuscular re-education;Patient/family education;Passive range of motion;Manual techniques;Dry needling;Taping   PT Next Visit Plan Continue to strengthen back and postural muscles, including scapular stabilizers   Consulted and Agree  with Plan of Care Patient      Patient will benefit from skilled therapeutic intervention in order to improve the following deficits and impairments:  Decreased range of motion, Impaired flexibility, Pain, Postural dysfunction, Impaired UE functional use  Visit Diagnosis: Cervicalgia  Abnormal posture     Problem List Patient Active Problem List   Diagnosis Date Noted  . Right arm numbness 12/31/2015  . Need for prophylactic vaccination against Streptococcus pneumoniae (pneumococcus) 12/31/2015  . Neoplasm of uncertain behavior of skin 01/26/2015  . OSA on CPAP 01/25/2015  . CAD (coronary artery disease) 01/25/2015  . Well adult exam 05/20/2012  . Pruritus ani 02/13/2012  . Stress 02/13/2012  . Wound, open, forearm 01/26/2011  . Fall against object 01/25/2011  . Degloving injury 01/25/2011  . Flank pain, acute 01/25/2011  . Knee pain, acute 01/25/2011  . Concussion 01/25/2011  . Vertigo 12/13/2010  . INSOMNIA, PERSISTENT 11/23/2008  . ALLERGY, FOOD 11/23/2008  . GERD 04/16/2007  . ERECTILE DYSFUNCTION 04/16/2007  . HYPERLIPIDEMIA 01/31/2007    Mikle Bosworth PTA 03/15/2016, 9:30 AM  [facility] [address] [place], [place], [address] Phone: [phone]   Fax:  [phone]  Name: Alan Wall MRN: [id number] Date of Birth: 1946/02/20

## 2016-03-20 ENCOUNTER — Ambulatory Visit: Payer: Medicare Other | Admitting: Physical Therapy

## 2016-03-20 ENCOUNTER — Encounter: Payer: Self-pay | Admitting: Physical Therapy

## 2016-03-20 DIAGNOSIS — M542 Cervicalgia: Secondary | ICD-10-CM

## 2016-03-20 DIAGNOSIS — R293 Abnormal posture: Secondary | ICD-10-CM

## 2016-03-20 NOTE — Therapy (Signed)
[facility] [address], [place], [place], [address] Phone: [phone]   Fax:  [phone]  Physical Therapy Treatment  Patient Details  Name: Alan Wall MRN: [id number] Date of Birth: 09/21/1945 Referring Provider: Petra Kuba, MD  Encounter Date: 03/20/2016      PT End of Session - 03/20/16 0858    Visit Number 8   Number of Visits 10   Date for PT Re-Evaluation 04/19/16   PT Start Time 0846   PT Stop Time 0931   PT Time Calculation (min) 45 min   Activity Tolerance Patient tolerated treatment well   Behavior During Therapy [facility] for tasks assessed/performed      Past Medical History:  Diagnosis Date  . Allergy    Food/cat dander/bee stings  . Hematoma of leg   . Hyperlipidemia   . S/P dilatation of esophageal stricture     Past Surgical History:  Procedure Laterality Date  . CATARACT EXTRACTION W/ INTRAOCULAR LENS  IMPLANT, BILATERAL  2010    There were no vitals filed for this visit.      Subjective Assessment - 03/20/16 0855    Subjective Pt reports shoulder has been doing well. Continues to have some occasional neck soreness but is going look at different pillows.    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     Patient Stated Goals reduce Rt UE radiculopathy, use Rt UE without pain   Currently in Pain? No/denies                         [facility] Adult PT Treatment/Exercise - 03/20/16 0001      Neck Exercises: Machines for Strengthening   UBE (Upper Arm Bike) 4x4 L3  Sitting on ball; Therapist present to discuss treatment     Shoulder Exercises: Prone   Extension AROM;Right;Left;15 reps  #3 3x10   Horizontal ABduction 1 AROM;Right;15 reps  #3 3x10   Horizontal ABduction 2 AROM;Right;15 reps  #3 3x10     Shoulder Exercises: Power Tower   Extension  20 reps  3x10   Extension Limitations #35   Row 20 reps  3x10   Row Limitations #45   External Rotation 20 reps  #15   Internal Rotation 20 reps  #20   Other Power Tower Exercises 30# right arm D2 extension diagonals 20x each   Other Power Tower Exercises triceps pull 3x10  #45                  PT Short Term Goals - 03/13/16 0846      PT SHORT TERM GOAL #3   Title demonstrate and verbalize correct seated posture and report corrections throughout the day   Time 4   Period Weeks   Status Achieved           PT Long Term Goals - 03/20/16 [id number]      PT LONG TERM GOAL #1   Title be independent in advanced HEP   Time 8   Period Weeks   Status On-going     PT LONG TERM GOAL #2   Title reduce FOTO to < or = to 28% limitation   Time 8   Period Weeks   Status On-going     PT LONG TERM GOAL #3   Title report a 60% reduction in UE  radiculopathy with self-care and ADLs   Time 8   Period Weeks   Status Achieved  90% reduction     PT LONG TERM GOAL #4   Title report no episodes of Rt UE weakness with use of Rt UE   Time 8   Period Weeks   Status Achieved               Plan - 03/20/16 0854    Clinical Impression Statement Pt reports shoulders feeling better, especially Rt elbow. Pt has good understanding of preogrssion of exercises and is compliant with home exericses. Will continue to strengthening Bil UE and postural stabilizers.    Rehab Potential Good   PT Frequency 2x / week   PT Duration 8 weeks   PT Next Visit Plan FOTO at next visit, reveiw progression of home exercises   Consulted and Agree with Plan of Care Patient      Patient will benefit from skilled therapeutic intervention in order to improve the following deficits and impairments:  Decreased range of motion, Impaired flexibility, Pain, Postural dysfunction, Impaired UE functional use  Visit Diagnosis: Cervicalgia  Abnormal posture     Problem List Patient Active Problem  List   Diagnosis Date Noted  . Right arm numbness 12/31/2015  . Need for prophylactic vaccination against Streptococcus pneumoniae (pneumococcus) 12/31/2015  . Neoplasm of uncertain behavior of skin 01/26/2015  . OSA on CPAP 01/25/2015  . CAD (coronary artery disease) 01/25/2015  . Well adult exam 05/20/2012  . Pruritus ani 02/13/2012  . Stress 02/13/2012  . Wound, open, forearm 01/26/2011  . Fall against object 01/25/2011  . Degloving injury 01/25/2011  . Flank pain, acute 01/25/2011  . Knee pain, acute 01/25/2011  . Concussion 01/25/2011  . Vertigo 12/13/2010  . INSOMNIA, PERSISTENT 11/23/2008  . ALLERGY, FOOD 11/23/2008  . GERD 04/16/2007  . ERECTILE DYSFUNCTION 04/16/2007  . HYPERLIPIDEMIA 01/31/2007    Alan Wall PTA 03/20/2016, 9:33 AM  [facility] [address] [place], [place], [address] Phone: [phone]   Fax:  [phone]  Name: Alan Wall MRN: [id number] Date of Birth: 09/19/1945

## 2016-03-21 ENCOUNTER — Other Ambulatory Visit (INDEPENDENT_AMBULATORY_CARE_PROVIDER_SITE_OTHER): Payer: Medicare Other

## 2016-03-21 ENCOUNTER — Encounter: Payer: Self-pay | Admitting: Internal Medicine

## 2016-03-21 ENCOUNTER — Ambulatory Visit (INDEPENDENT_AMBULATORY_CARE_PROVIDER_SITE_OTHER): Payer: Medicare Other | Admitting: Internal Medicine

## 2016-03-21 VITALS — BP 106/72 | HR 51 | Temp 97.8°F | Resp 16 | Ht 67.5 in | Wt 159.2 lb

## 2016-03-21 DIAGNOSIS — L304 Erythema intertrigo: Secondary | ICD-10-CM | POA: Diagnosis not present

## 2016-03-21 DIAGNOSIS — G4733 Obstructive sleep apnea (adult) (pediatric): Secondary | ICD-10-CM | POA: Diagnosis not present

## 2016-03-21 DIAGNOSIS — I2583 Coronary atherosclerosis due to lipid rich plaque: Secondary | ICD-10-CM

## 2016-03-21 DIAGNOSIS — L290 Pruritus ani: Secondary | ICD-10-CM

## 2016-03-21 DIAGNOSIS — Z23 Encounter for immunization: Secondary | ICD-10-CM

## 2016-03-21 DIAGNOSIS — E785 Hyperlipidemia, unspecified: Secondary | ICD-10-CM

## 2016-03-21 DIAGNOSIS — Z9989 Dependence on other enabling machines and devices: Secondary | ICD-10-CM

## 2016-03-21 DIAGNOSIS — I2510 Atherosclerotic heart disease of native coronary artery without angina pectoris: Secondary | ICD-10-CM

## 2016-03-21 DIAGNOSIS — Z0000 Encounter for general adult medical examination without abnormal findings: Secondary | ICD-10-CM

## 2016-03-21 DIAGNOSIS — H9193 Unspecified hearing loss, bilateral: Secondary | ICD-10-CM

## 2016-03-21 DIAGNOSIS — N529 Male erectile dysfunction, unspecified: Secondary | ICD-10-CM

## 2016-03-21 LAB — LIPID PANEL
CHOLESTEROL: 141 mg/dL (ref 0–200)
HDL: 52.9 mg/dL (ref >39.00)
LDL CALC: 74 mg/dL (ref 0–99)
NONHDL: 87.95
Total CHOL/HDL Ratio: 3
Triglycerides: 72 mg/dL (ref 0.0–149.0)
VLDL: 14.4 mg/dL (ref 0.0–40.0)

## 2016-03-21 LAB — BASIC METABOLIC PANEL
BUN: 13 mg/dL (ref 6–23)
CO2: 31 mEq/L (ref 19–32)
CREATININE: 1.03 mg/dL (ref 0.40–1.50)
Calcium: 9.4 mg/dL (ref 8.4–10.5)
Chloride: 104 mEq/L (ref 96–112)
GFR: 75.9 mL/min (ref >60.00)
Glucose, Bld: 96 mg/dL (ref 70–99)
POTASSIUM: 4.6 meq/L (ref 3.5–5.1)
Sodium: 140 mEq/L (ref 135–145)

## 2016-03-21 LAB — URINALYSIS
BILIRUBIN URINE: NEGATIVE
KETONES UR: NEGATIVE
LEUKOCYTES UA: NEGATIVE
Nitrite: NEGATIVE
PH: 6.5 (ref 5.0–8.0)
SPECIFIC GRAVITY, URINE: 1.02 (ref 1.000–1.030)
Total Protein, Urine: NEGATIVE
URINE GLUCOSE: NEGATIVE
Urobilinogen, UA: 0.2 (ref 0.0–1.0)

## 2016-03-21 LAB — HEPATIC FUNCTION PANEL
ALK PHOS: 40 U/L (ref 39–117)
ALT: 18 U/L (ref 0–53)
AST: 20 U/L (ref 0–37)
Albumin: 4.4 g/dL (ref 3.5–5.2)
BILIRUBIN DIRECT: 0.1 mg/dL (ref 0.0–0.3)
BILIRUBIN TOTAL: 0.6 mg/dL (ref 0.2–1.2)
Total Protein: 6.4 g/dL (ref 6.0–8.3)

## 2016-03-21 LAB — CBC WITH DIFFERENTIAL/PLATELET
Basophils Absolute: 0 10*3/uL (ref 0.0–0.1)
Basophils Relative: 0.9 % (ref 0.0–3.0)
EOS ABS: 0.1 10*3/uL (ref 0.0–0.7)
EOS PCT: 2.3 % (ref 0.0–5.0)
HEMATOCRIT: 45.5 % (ref 39.0–52.0)
HEMOGLOBIN: 15.1 g/dL (ref 13.0–17.0)
LYMPHS PCT: 32.5 % (ref 12.0–46.0)
Lymphs Abs: 1.5 10*3/uL (ref 0.7–4.0)
MCHC: 33.2 g/dL (ref 30.0–36.0)
MCV: 97.7 fl (ref 78.0–100.0)
MONO ABS: 0.5 10*3/uL (ref 0.1–1.0)
Monocytes Relative: 10.6 % (ref 3.0–12.0)
Neutro Abs: 2.5 10*3/uL (ref 1.4–7.7)
Neutrophils Relative %: 53.7 % (ref 43.0–77.0)
Platelets: 222 10*3/uL (ref 150.0–400.0)
RBC: 4.66 Mil/uL (ref 4.22–5.81)
RDW: 12.9 % (ref 11.5–15.5)
WBC: 4.6 10*3/uL (ref 4.0–10.5)

## 2016-03-21 LAB — TSH: TSH: 0.72 u[IU]/mL (ref 0.35–4.50)

## 2016-03-21 LAB — PSA: PSA: 2.26 ng/mL (ref 0.10–4.00)

## 2016-03-21 MED ORDER — TADALAFIL 20 MG PO TABS: 20.0000 mg | ORAL_TABLET | Freq: Every day | ORAL | 5 refills | Status: DC | PRN

## 2016-03-21 MED ORDER — KETOCONAZOLE 2 % EX CREA: 1.0000 "application " | TOPICAL_CREAM | Freq: Every day | CUTANEOUS | 1 refills | Status: AC

## 2016-03-21 NOTE — Assessment & Plan Note (Signed)
Ketoconazole cream [place] underwear

## 2016-03-21 NOTE — Assessment & Plan Note (Signed)
Pravastatin Labs 

## 2016-03-21 NOTE — Assessment & Plan Note (Signed)
Cialis PRN 

## 2016-03-21 NOTE — Assessment & Plan Note (Signed)
Valerian root 

## 2016-03-21 NOTE — Progress Notes (Signed)
Subjective:  Patient ID: Alan Wall, male    DOB: 29-Oct-1945  Age: 70 y.o. MRN: [id number]  CC: Follow-up (Medication mamagement; possibly need Labs)   HPI Alan Wall presents for CAD, dyslipidemia f/u C/o R arm numbness - MRI brain, C spine were (-) - seeing Ortho C/o depressed mood, insomnia - in jan he will see a sleep psychologist. On CPAP x 12 mo C/o rash in the groin from biking   Outpatient Medications Prior to Visit  Medication Sig Dispense Refill  . aspirin 81 MG tablet Take 81 mg by mouth every other day.     . Chlorpheniramine-PSE-Ibuprofen (ADVIL ALLERGY SINUS) 2-30-200 MG TABS 1-2 tabs PO Q4-6 hrs PRN 30 each 1  . fluticasone (FLONASE) 50 MCG/ACT nasal spray   4  . hydrocortisone cream 1 % Apply topically 2 (two) times daily. (Patient taking differently: Apply topically 2 (two) times daily as needed. ) 30 g 3  . pravastatin (PRAVACHOL) 40 MG tablet TAKE 1 TABLET(S) DAILY 60 tablet 0  . zolpidem (AMBIEN) 10 MG tablet Take 1 tablet (10 mg total) by mouth at bedtime as needed. 30 tablet 3  . EPINEPHrine (EPIPEN 2-PAK) 0.3 mg/0.3 mL DEVI Inject 0.3 mg into the muscle as directed.      . nystatin cream (MYCOSTATIN) Apply topically 2 (two) times daily. (Patient not taking: Reported on 03/21/2016) 30 g 3  . methylPREDNISolone (MEDROL DOSEPAK) 4 MG tablet Use as directed on package instructions (Patient not taking: Reported on 02/23/2016) 21 tablet 0   No facility-administered medications prior to visit.     ROS Review of Systems  Constitutional: Negative for appetite change, fatigue and unexpected weight change.  HENT: Negative for congestion, nosebleeds, sneezing, sore throat and trouble swallowing.   Eyes: Negative for itching and visual disturbance.  Respiratory: Negative for cough.   Cardiovascular: Negative for chest pain, palpitations and leg swelling.  Gastrointestinal: Negative for abdominal distention, blood in stool, diarrhea and nausea.  Genitourinary:  Negative for frequency and hematuria.  Musculoskeletal: Negative for back pain, gait problem, joint swelling and neck pain.  Skin: Negative for rash.  Neurological: Negative for dizziness, tremors, speech difficulty and weakness.  Psychiatric/Behavioral: Positive for decreased concentration and dysphoric mood. Negative for agitation and sleep disturbance. The patient is not nervous/anxious.     Objective:  BP 106/72 (BP Location: Right Arm, Patient Position: Sitting, Cuff Size: Large)   Pulse (!) 51   Temp 97.8 F (36.6 C) (Oral)   Resp 16   Ht 5' 7.5" (1.715 m)   Wt 159 lb 4 oz (72.2 kg)   SpO2 98%   BMI 24.57 kg/m   BP Readings from Last 3 Encounters:  03/21/16 106/72  12/31/15 120/86  12/29/15 119/99    Wt Readings from Last 3 Encounters:  03/21/16 159 lb 4 oz (72.2 kg)  12/31/15 161 lb (73 kg)  12/29/15 170 lb (77.1 kg)    Physical Exam  Constitutional: He is oriented to person, place, and time. He appears well-developed. No distress.  NAD  HENT:  Mouth/Throat: Oropharynx is clear and moist.  Eyes: Conjunctivae are normal. Pupils are equal, round, and reactive to light.  Neck: Normal range of motion. No JVD present. No thyromegaly present.  Cardiovascular: Normal rate, regular rhythm, normal heart sounds and intact distal pulses.  Exam reveals no gallop and no friction rub.   No murmur heard. Pulmonary/Chest: Effort normal and breath sounds normal. No respiratory distress. He has no wheezes. He has  no rales. He exhibits no tenderness.  Abdominal: Soft. Bowel sounds are normal. He exhibits no distension and no mass. There is no tenderness. There is no rebound and no guarding.  Musculoskeletal: Normal range of motion. He exhibits no edema or tenderness.  Lymphadenopathy:    He has no cervical adenopathy.  Neurological: He is alert and oriented to person, place, and time. He has normal reflexes. No cranial nerve deficit. He exhibits normal muscle tone. He displays a  negative Romberg sign. Coordination and gait normal.  Skin: Skin is warm and dry. No rash noted.  Psychiatric: He has a normal mood and affect. His behavior is normal. Judgment and thought content normal.  erosive rash between buttocks  Lab Results  Component Value Date   WBC 6.8 12/29/2015   HGB 15.3 12/29/2015   HCT 45.0 12/29/2015   PLT 195 12/29/2015   GLUCOSE 104 (H) 12/29/2015   CHOL 147 01/26/2015   TRIG 64.0 01/26/2015   HDL 58.40 01/26/2015   LDLDIRECT 125.2 06/12/2012   LDLCALC 76 01/26/2015   ALT 18 12/29/2015   AST 27 12/29/2015   NA 140 12/29/2015   K 4.0 12/29/2015   CL 103 12/29/2015   CREATININE 1.00 12/29/2015   BUN 18 12/29/2015   CO2 25 12/29/2015   TSH 0.89 12/31/2015   PSA 1.53 01/26/2015   INR 1.07 12/29/2015   HGBA1C 6.0 12/31/2015    Mr Cervical Spine Wo Contrast  Result Date: 01/01/2016 CLINICAL DATA:  Right shoulder and arm pain with numbness beginning 4 days ago. Numbness throughout the arm and shoulder. EXAM: MRI CERVICAL SPINE WITHOUT CONTRAST TECHNIQUE: Multiplanar, multisequence MR imaging of the cervical spine was performed. No intravenous contrast was administered. COMPARISON:  MRI brain 12/29/2015 FINDINGS: Alignment: Slight anterolisthesis is present at C3-4. AP alignment is otherwise anatomic. There is straightening and slight reversal of normal cervical lordosis at C4-5 and C5-6. Vertebrae: Chronic endplate marrow changes are most evident at C4-5. Lesser changes are present at C5-6. Cord: Normal signal is present throughout the cervical and upper thoracic spinal cord to the lowest imaged level, T1-2 Posterior Fossa, vertebral arteries, paraspinal tissues: The craniocervical junction is within normal limits. Visualized intracranial contents are normal. Flow is present in the vertebral arteries bilaterally. Disc levels: C2-3: Asymmetric right-sided facet hypertrophy and spurring results in mild right foraminal narrowing. There is some edema within  the facet joint on the right at C2-3. The central canal and left foramen are patent. C3-4: A broad-based disc osteophyte complex is present. Moderate facet hypertrophy is worse on the left. There is mild edema within the left C3-4 facet joint. Uncovertebral spurring contributes to moderate left and mild right foraminal narrowing. There is partial effacement of the ventral CSF. C4-5: A broad-based disc osteophyte complex is asymmetric to the left. Uncovertebral and facet hypertrophy results in severe left foraminal narrowing. There is mild right foraminal stenosis. Partial effacement of the ventral CSF is noted. C5-6: A broad-based disc osteophyte complex is asymmetric to the right. Uncovertebral disease results in severe right foraminal stenosis. Moderate left foraminal narrowing is evident. C6-7: A shallow central disc protrusion is present without significant stenosis. C7-T1:  Negative. IMPRESSION: 1. The most significant right-sided disease is at C5-6 were uncovertebral spurring results in severe right foraminal stenosis. 2. Mild central and left foraminal narrowing at C5-6. 3. Severe left and mild right foraminal stenosis at C4-5. 4. Moderate left-sided facet hypertrophy with acute inflammatory changes and edema in the left C3-4 facet joint. 5. Moderate  left and mild right foraminal stenosis at C3-4. 6. Asymmetric right-sided facet hypertrophy with acute inflammatory changes and edema in the right C2-3 facet joint. 7. Mild right foraminal stenosis at C2-3. Electronically Signed   By: San Morelle M.D.   On: 01/01/2016 09:40    Assessment & Plan:   There are no diagnoses linked to this encounter. I have discontinued Alan Wall methylPREDNISolone. I am also having him maintain his aspirin, EPINEPHrine, zolpidem, hydrocortisone cream, nystatin cream, Chlorpheniramine-PSE-Ibuprofen, fluticasone, pravastatin, Cyanocobalamin (VITAMIN B-12 CR PO), and cholecalciferol.  Meds ordered this encounter    Medications  . Cyanocobalamin (VITAMIN B-12 CR PO)    Sig: Take by mouth daily.  . cholecalciferol (VITAMIN D) 400 units TABS tablet    Sig: Take 400 Units by mouth daily.     Follow-up: No Follow-up on file.  Walker Kehr, MD

## 2016-03-21 NOTE — Assessment & Plan Note (Signed)
11/17 biking related  Ketoconazole

## 2016-03-21 NOTE — Progress Notes (Signed)
Pre visit review using our clinic review tool, if applicable. No additional management support is needed unless otherwise documented below in the visit note/SLS  

## 2016-03-21 NOTE — Assessment & Plan Note (Addendum)
Labs ASA, Pravachol

## 2016-03-21 NOTE — Patient Instructions (Addendum)
Valerian root for sleep 

## 2016-03-22 ENCOUNTER — Ambulatory Visit: Payer: Medicare Other

## 2016-03-22 DIAGNOSIS — M542 Cervicalgia: Secondary | ICD-10-CM

## 2016-03-22 DIAGNOSIS — R293 Abnormal posture: Secondary | ICD-10-CM

## 2016-03-22 NOTE — Therapy (Signed)
[facility] [address] [place], [place], [address] Phone: [phone]   Fax:  [phone]  Physical Therapy Treatment  Patient Details  Name: Alan Wall MRN: [id number] Date of Birth: [birth year] Referring Provider: Shirlean Avrey Flanagin, MD  Encounter Date: 03/22/2016      PT End of Session - 03/22/16 0909    Visit Number 9   PT Start Time 3244   PT Stop Time 0910   PT Time Calculation (min) 38 min   Activity Tolerance Patient tolerated treatment well   Behavior During Therapy [facility] for tasks assessed/performed      Past Medical History:  Diagnosis Date  . Allergy    Food/cat dander/bee stings  . Hematoma of leg   . Hyperlipidemia   . S/P dilatation of esophageal stricture     Past Surgical History:  Procedure Laterality Date  . CATARACT EXTRACTION W/ INTRAOCULAR LENS  IMPLANT, BILATERAL  2010    There were no vitals filed for this visit.      Subjective Assessment - 03/22/16 0836    Subjective No pain.  85-90% overall improvement in symptoms since the start of care.  Ready for D/C to HEP.     Diagnostic tests MRI of cervical spine: Rt sided stenosis C5-6, Lt facet hypertrophy on Lt C3-4, Mild Rt foraminal stenosis C2-3.     Currently in Pain? No/denies            [facility] PT Assessment - 03/22/16 0001      Assessment   Medical Diagnosis DDD c-spine     Observation/Other Assessments   Focus on Therapeutic Outcomes (FOTO)  23% limitation                     [facility] Adult PT Treatment/Exercise - 03/22/16 0001      Neck Exercises: Machines for Strengthening   UBE (Upper Arm Bike) 4x4 L3  Sitting on ball; Therapist present for final goal assessment     Shoulder Exercises: Seated   Other Seated Exercises 3 way raises: 3# 2x10     Shoulder Exercises: Prone   Extension AROM;Right;Left;15 reps  3# 3x10   Horizontal ABduction 1 AROM;Right;15 reps  3# 3x10   Horizontal ABduction  2 AROM;Right;15 reps  3# 3x10     Shoulder Exercises: Power Tower   Extension 20 reps  3x10   Extension Limitations 35#   Row 20 reps  3x10   Row Limitations 45#   External Rotation 20 reps  20#   Internal Rotation 20 reps  203   Other Power Tower Exercises 30# right arm D2 extension diagonals 20x each                  PT Short Term Goals - 03/13/16 0846      PT SHORT TERM GOAL #3   Title demonstrate and verbalize correct seated posture and report corrections throughout the day   Time 4   Period Weeks   Status Achieved           PT Long Term Goals - 03/22/16 0102      PT LONG TERM GOAL #1   Title be independent in advanced HEP   Status Achieved     PT LONG TERM GOAL #2   Title reduce FOTO to < or = to 28% limitation   Status Achieved     PT LONG TERM GOAL #3   Title report a 60% reduction in UE radiculopathy  with self-care and ADLs   Status Achieved     PT LONG TERM GOAL #4   Title report no episodes of Rt UE weakness with use of Rt UE   Status Achieved               Plan - 2016-04-08 0841    Clinical Impression Statement Pt reports 85-90% overall improvement in symptoms since the start of care.  Pt denies any weakness or radiculopathy in UEs at this time.  Pt has received HEP and instruction in proper form and advancement with gym exercises.  Pt will D/C to gym exercises and HEP.     PT Next Visit Plan D/C PT to HEP today.     Consulted and Agree with Plan of Care Patient      Patient will benefit from skilled therapeutic intervention in order to improve the following deficits and impairments:     Visit Diagnosis: Cervicalgia  Abnormal posture       G-Codes - 2016/04/08 0837    Functional Assessment Tool Used FOTO: 23% limitation   Functional Limitation Carrying, moving and handling objects   Carrying, Moving and Handling Objects Goal Status ([id number]) At least 20 percent but less than 40 percent impaired, limited or restricted    Carrying, Moving and Handling Objects Discharge Status [phone]) At least 20 percent but less than 40 percent impaired, limited or restricted      Problem List Patient Active Problem List   Diagnosis Date Noted  . Intertrigo 03/21/2016  . Right arm numbness 12/31/2015  . Need for prophylactic vaccination against Streptococcus pneumoniae (pneumococcus) 12/31/2015  . Neoplasm of uncertain behavior of skin 01/26/2015  . OSA on CPAP 01/25/2015  . CAD (coronary artery disease) 01/25/2015  . Well adult exam 05/20/2012  . Pruritus ani 02/13/2012  . Stress 02/13/2012  . Wound, open, forearm 01/26/2011  . Fall against object 01/25/2011  . Degloving injury 01/25/2011  . Flank pain, acute 01/25/2011  . Knee pain, acute 01/25/2011  . Concussion 01/25/2011  . Vertigo 12/13/2010  . INSOMNIA, PERSISTENT 11/23/2008  . ALLERGY, FOOD 11/23/2008  . GERD 04/16/2007  . ERECTILE DYSFUNCTION 04/16/2007  . Dyslipidemia 01/31/2007   PHYSICAL THERAPY DISCHARGE SUMMARY  Visits from Start of Care: 9  Current functional level related to goals / functional outcomes: Pt denies any symptoms at this time.  85-90% overall improvement in symptoms since the start of care.     Remaining deficits: No deficits remain at this time.    Education / Equipment: HEP, posture, gym exercises Plan: Patient agrees to discharge.  Patient goals were met. Patient is being discharged due to meeting the stated rehab goals.  ?????         Sigurd Sos, PT 04-08-2016 9:11 AM  [facility] [address] [place], [place], [address] Phone: [phone]   Fax:  [phone]  Name: Alan Wall MRN: [id number] Date of Birth: 1945-09-05

## 2016-03-27 ENCOUNTER — Encounter: Payer: Medicare Other | Admitting: Physical Therapy

## 2016-06-22 ENCOUNTER — Telehealth: Payer: Self-pay | Admitting: Internal Medicine

## 2016-06-22 NOTE — Telephone Encounter (Signed)
Encounter needs to be closed.  Will not allow me to do so.

## 2016-06-22 NOTE — Telephone Encounter (Signed)
Will close message. Nothing further is needed

## 2016-06-23 ENCOUNTER — Telehealth: Payer: Self-pay | Admitting: Internal Medicine

## 2016-06-23 NOTE — Telephone Encounter (Signed)
Spoke with patient advised the quickest way to get the release to his old physician's office to fill out another form - Offered to mail him a blank form, or he can come to the office to complete again in person. Patient asked for me to mail the release of record form and states that if he is in the area he will stop by. Placed new form in the mail- nothing further needed -pr

## 2016-06-23 NOTE — Telephone Encounter (Signed)
Spoke with pt and informed him that his form was sent to scan that a new form will need to be done. Spoke with Mardene Celeste and she stated she could mail the form to the pt and he could just fax it himself he does not like this option and is upset because he is trying to get this done as soon as possible. He wanted other options let patrice know what is going on and she will speak to the pt

## 2016-07-18 ENCOUNTER — Ambulatory Visit (INDEPENDENT_AMBULATORY_CARE_PROVIDER_SITE_OTHER): Payer: Medicare Other | Admitting: Internal Medicine

## 2016-07-18 ENCOUNTER — Encounter: Payer: Self-pay | Admitting: Internal Medicine

## 2016-07-18 VITALS — BP 128/72 | HR 65 | Ht 67.5 in | Wt 166.8 lb

## 2016-07-18 DIAGNOSIS — G4700 Insomnia, unspecified: Secondary | ICD-10-CM

## 2016-07-18 DIAGNOSIS — Z9989 Dependence on other enabling machines and devices: Secondary | ICD-10-CM | POA: Diagnosis not present

## 2016-07-18 DIAGNOSIS — G4733 Obstructive sleep apnea (adult) (pediatric): Secondary | ICD-10-CM | POA: Diagnosis not present

## 2016-07-18 DIAGNOSIS — J3089 Other allergic rhinitis: Secondary | ICD-10-CM | POA: Diagnosis not present

## 2016-07-18 DIAGNOSIS — J302 Other seasonal allergic rhinitis: Secondary | ICD-10-CM | POA: Insufficient documentation

## 2016-07-18 NOTE — Progress Notes (Signed)
07/18/2016- 71 year old male never smoker referred by PCP Dr. Alain Marion,  Dr. Amelia Jo at [facility] in [place] was treating sleep apnea for 4 years, he uses CPAP machine but needs someone in [place] to follow him for Sleep apnea Medical problem list includes allergy, CAD, GERD, insomnia, OSA/CPAP He describes his problem as inability to sleep more than 44 hours at a time during the night without interruption. Sleeps much better with CPAP. NPSG 12/30/2013-AHI 19/hour, desaturation to 88%, body weight 165 pounds. He has been using CPAP auto 12-16. He found an insomnia specialist in the psychology division at [facility] and is much improved after treatment that included cognitive behavioral therapy. Uses rare Ambien 10 mg but usually does not need medication. He denies limb movement sleep disorder or disturbances suggestive of REM Behavior Disorder. As long as he uses his CPAP he denies significant daytime sleepiness and feels quality of life is good. Medical problems include arthritis and seasonal allergic rhinitis treated with a nasal spray. ENT surgery-tonsils. I see his wife Alan Wall for OSA.Marland Kitchen  Prior to Admission medications   Medication Sig Start Date End Date Taking? Authorizing Provider  aspirin 81 MG tablet Take 81 mg by mouth every other day.    Yes Historical Provider, MD  Chlorpheniramine-PSE-Ibuprofen (ADVIL ALLERGY SINUS) 2-30-200 MG TABS 1-2 tabs PO Q4-6 hrs PRN 06/28/14  Yes Liam Graham, PA-C  cholecalciferol (VITAMIN D) 400 units TABS tablet Take 400 Units by mouth daily.   Yes Historical Provider, MD  Cyanocobalamin (VITAMIN B-12 CR PO) Take by mouth daily.   Yes Historical Provider, MD  EPINEPHrine (EPIPEN 2-PAK) 0.3 mg/0.3 mL DEVI Inject 0.3 mg into the muscle as directed.     Yes Historical Provider, MD  fluticasone Asencion Islam) 50 MCG/ACT nasal spray  01/13/15  Yes Historical Provider, MD  hydrocortisone cream 1 % Apply topically 2 (two) times daily. Patient taking  differently: Apply topically 2 (two) times daily as needed.  06/09/12  Yes Harden Mo, MD  ketoconazole (NIZORAL) 2 % cream Apply 1 application topically daily. 03/21/16 03/21/17 Yes Aleksei Plotnikov V, MD  nystatin cream (MYCOSTATIN) Apply topically 2 (two) times daily. 06/09/12  Yes Harden Mo, MD  pravastatin (PRAVACHOL) 40 MG tablet TAKE 1 TABLET(S) DAILY 03/14/16  Yes Aleksei Plotnikov V, MD  zolpidem (AMBIEN) 10 MG tablet Take 1 tablet (10 mg total) by mouth at bedtime as needed. 03/08/11  Yes Aleksei Plotnikov V, MD  tadalafil (CIALIS) 20 MG tablet Take 1 tablet (20 mg total) by mouth daily as needed for erectile dysfunction. 03/21/16 04/20/16  Cassandria Anger, MD   Past Medical History:  Diagnosis Date  . Allergy    Food/cat dander/bee stings  . Hematoma of leg   . Hyperlipidemia   . S/P dilatation of esophageal stricture    Past Surgical History:  Procedure Laterality Date  . CATARACT EXTRACTION W/ INTRAOCULAR LENS  IMPLANT, BILATERAL  2010   Family History  Problem Relation Age of Onset  . Heart disease Father     CABG at 31 yo  . Heart disease Brother   . Prostate cancer Paternal Uncle   . Coronary artery disease Other    Social History   Social History  . Marital status: Married    Spouse name: N/A  . Number of children: 0  . Years of education: N/A   Occupational History  .  [employer]   Social History Main Topics  . Smoking status: Never Smoker  .  Smokeless tobacco: Never Used  . Alcohol use 1.2 oz/week    2 Glasses of wine per week  . Drug use: No  . Sexual activity: Yes   Other Topics Concern  . Not on file   Social History Narrative   Regular Exercise- yes, bicycle   ROS-see HPI   Negative unless "+" Constitutional:    weight loss, night sweats, fevers, chills, fatigue, lassitude. HEENT:    headaches, difficulty swallowing, tooth/dental problems, sore throat,       sneezing, itching, ear ache, + nasal congestion, post nasal  drip, snoring CV:    chest pain, orthopnea, PND, swelling in lower extremities, anasarca,                                                       dizziness, palpitations Resp:   shortness of breath with exertion or at rest.                productive cough,   non-productive cough, coughing up of blood.              change in color of mucus.  wheezing.   Skin:    rash or lesions. GI:  No-   heartburn, indigestion, abdominal pain, nausea, vomiting, diarrhea,                 change in bowel habits, loss of appetite GU: dysuria, change in color of urine, no urgency or frequency.   flank pain. MS:   joint pain, stiffness, decreased range of motion, back pain. Neuro-     nothing unusual Psych:  change in mood or affect.  depression or anxiety.   memory loss.  OBJ- Physical Exam   not obese General- Alert, Oriented, Affect-appropriate, Distress- none acute Skin- rash-none, lesions- none, excoriation- none Lymphadenopathy- none Head- atraumatic            Eyes- Gross vision intact, PERRLA, conjunctivae and secretions clear            Ears- Hearing, canals-normal            Nose- Clear, no-Septal dev, mucus, polyps, erosion, perforation             Throat- Mallampati II , mucosa clear , drainage- none, tonsils- atrophic Neck- flexible , trachea midline, no stridor , thyroid nl, carotid no bruit Chest - symmetrical excursion , unlabored           Heart/CV- RRR , no murmur , no gallop  , no rub, nl s1 s2                           - JVD- none , edema- none, stasis changes- none, varices- none           Lung- clear to P&A, wheeze- none, cough- none , dullness-none, rub- none           Chest wall-  Abd-  Br/ Gen/ Rectal- Not done, not indicated Extrem- cyanosis- none, clubbing, none, atrophy- none, strength- nl Neuro- grossly intact to observation

## 2016-07-18 NOTE — Patient Instructions (Addendum)
Order- [facility]- we are continuing CPAP auto 12-16, mask of choice, humidifier, supplies, AirView   Dx OSA  Please call if we can help

## 2016-07-18 NOTE — Assessment & Plan Note (Signed)
Rare use of Ambien seems to be appropriate. He feels he has the insomnia issues further well-controlled

## 2016-07-18 NOTE — Assessment & Plan Note (Signed)
Spring and fall pollen season related nasal congestion. Some sensitivity to weather changes. This doesn't seem to prevent him from using CPAP. Plan-Flonase during pollen season. Other interventions if needed.

## 2016-07-18 NOTE — Assessment & Plan Note (Signed)
He feels his machine is doing well, mask is comfortable and pressures are comfortable. Plan-he is doing very well with CPAP. We will establish him with a local DME for supplies and ongoing support including downloads for documentation.

## 2016-09-26 ENCOUNTER — Other Ambulatory Visit: Payer: Self-pay | Admitting: *Deleted

## 2016-09-26 MED ORDER — PRAVASTATIN SODIUM 40 MG PO TABS: ORAL_TABLET | ORAL | 1 refills | Status: DC

## 2017-02-22 ENCOUNTER — Ambulatory Visit (INDEPENDENT_AMBULATORY_CARE_PROVIDER_SITE_OTHER): Payer: Medicare Other | Admitting: General Practice

## 2017-02-22 DIAGNOSIS — Z23 Encounter for immunization: Secondary | ICD-10-CM

## 2017-04-11 ENCOUNTER — Other Ambulatory Visit: Payer: Self-pay | Admitting: Internal Medicine

## 2017-04-20 ENCOUNTER — Other Ambulatory Visit (HOSPITAL_COMMUNITY): Payer: Self-pay | Admitting: Internal Medicine

## 2017-04-20 DIAGNOSIS — E7800 Pure hypercholesterolemia, unspecified: Secondary | ICD-10-CM

## 2017-04-20 DIAGNOSIS — Z8249 Family history of ischemic heart disease and other diseases of the circulatory system: Secondary | ICD-10-CM

## 2017-07-16 ENCOUNTER — Encounter: Payer: Self-pay | Admitting: Gastroenterology

## 2017-07-18 ENCOUNTER — Ambulatory Visit: Payer: Medicare Other | Admitting: Internal Medicine

## 2017-07-18 ENCOUNTER — Encounter: Payer: Self-pay | Admitting: Internal Medicine

## 2017-07-18 VITALS — BP 110/64 | HR 66 | Ht 67.5 in | Wt 164.0 lb

## 2017-07-18 DIAGNOSIS — J302 Other seasonal allergic rhinitis: Secondary | ICD-10-CM

## 2017-07-18 DIAGNOSIS — Z9989 Dependence on other enabling machines and devices: Secondary | ICD-10-CM

## 2017-07-18 DIAGNOSIS — G4733 Obstructive sleep apnea (adult) (pediatric): Secondary | ICD-10-CM | POA: Diagnosis not present

## 2017-07-18 DIAGNOSIS — J3089 Other allergic rhinitis: Secondary | ICD-10-CM | POA: Diagnosis not present

## 2017-07-18 DIAGNOSIS — Z23 Encounter for immunization: Secondary | ICD-10-CM

## 2017-07-18 NOTE — Patient Instructions (Signed)
TDAP  We can continue CPAP auto 12-16, mask of choice, humidifier, supplies, AirView,   Order- schedule mask fitting at [facility] to use Flonase/ fluticasone, a non-sedating antihistamine like Claritin/ loratidine, and a decongestant like Sudafed if needed  Please cal if needed

## 2017-07-18 NOTE — Assessment & Plan Note (Signed)
Occasionally waking with rettro-orbital pressure headache and expects more nasal congestion this time of year. Plan- discussed comfort, flonase, claritin, sudafed

## 2017-07-18 NOTE — Progress Notes (Signed)
07/18/2016- 72 year old male never smoker referred by PCP Dr. Alain Marion,  Dr. Amelia Jo at [facility] in [place] was treating sleep apnea for 4 years, he uses CPAP machine but needs someone in [place] to follow him for Sleep apnea Medical problem list includes allergy, CAD, GERD, insomnia, OSA/CPAP He describes his problem as inability to sleep more than 44 hours at a time during the night without interruption. Sleeps much better with CPAP. NPSG 12/30/2013-AHI 19/hour, desaturation to 88%, body weight 165 pounds. He has been using CPAP auto 12-16. He found an insomnia specialist in the psychology division at [facility] and is much improved after treatment that included cognitive behavioral therapy. Uses rare Ambien 10 mg but usually does not need medication. He denies limb movement sleep disorder or disturbances suggestive of REM Behavior Disorder. As long as he uses his CPAP he denies significant daytime sleepiness and feels quality of life is good. Medical problems include arthritis and seasonal allergic rhinitis treated with a nasal spray. ENT surgery-tonsils. I see his wife YSMAEL HIRES for OSA..  07/18/17-72 year old male never smoker followed for OSA, Insomnia complicated by CAD, GERD, -----f/u CPAP, doing well  CPAP auto 12-16/[facility] Requests TDAP Download 87% AHI 3.2/hour.  We discussed mask fit and comfort and he would like a mask fitting at the sleep center.  He sleeps better with CPAP and wife confirms it prevents his snoring. Discussed seasonal allergic rhinitis- flonase and claritin.   ROS-see HPI   + = positive Constitutional:    weight loss, night sweats, fevers, chills, fatigue, lassitude. HEENT:    headaches, difficulty swallowing, tooth/dental problems, sore throat,       sneezing, itching, ear ache, + nasal congestion, post nasal drip, snoring CV:    chest pain, orthopnea, PND, swelling in lower extremities, anasarca,                                         dizziness, palpitations Resp:   shortness of breath with exertion or at rest.                productive cough,   non-productive cough, coughing up of blood.              change in color of mucus.  wheezing.   Skin:    rash or lesions. GI:  No-   heartburn, indigestion, abdominal pain, nausea, vomiting, diarrhea,                 change in bowel habits, loss of appetite GU: dysuria, change in color of urine, no urgency or frequency.   flank pain. MS:   joint pain, stiffness, decreased range of motion, back pain. Neuro-     nothing unusual Psych:  change in mood or affect.  depression or anxiety.   memory loss.  OBJ- Physical Exam   not obese General- Alert, Oriented, Affect-appropriate, Distress- none acute Skin- rash-none, lesions- none, excoriation- none Lymphadenopathy- none Head- atraumatic            Eyes- Gross vision intact, PERRLA, conjunctivae and secretions clear            Ears- Hearing, canals-normal            Nose- Clear, no-Septal dev, mucus, polyps, erosion, perforation             Throat- Mallampati II , mucosa clear , drainage- none, tonsils-  atrophic Neck- flexible , trachea midline, no stridor , thyroid nl, carotid no bruit Chest - symmetrical excursion , unlabored           Heart/CV- RRR , no murmur , no gallop  , no rub, nl s1 s2                           - JVD- none , edema- none, stasis changes- none, varices- none           Lung- clear to P&A, wheeze- none, cough- none , dullness-none, rub- none           Chest wall-  Abd-  Br/ Gen/ Rectal- Not done, not indicated Extrem- cyanosis- none, clubbing, none, atrophy- none, strength- nl Neuro- grossly intact to observation

## 2017-07-18 NOTE — Assessment & Plan Note (Signed)
Benefits from  CPAP- sleeping better and not snoring. Not comfortable with mask leak, but pressure range is good.  Plan- Mask fitting. Continue CPAP auto 12-16,

## 2017-07-26 LAB — HM COLONOSCOPY

## 2017-08-01 ENCOUNTER — Ambulatory Visit (HOSPITAL_BASED_OUTPATIENT_CLINIC_OR_DEPARTMENT_OTHER): Payer: Medicare Other | Attending: Internal Medicine | Admitting: Radiology

## 2017-08-01 ENCOUNTER — Encounter: Payer: Self-pay | Admitting: Internal Medicine

## 2017-08-01 DIAGNOSIS — Z9989 Dependence on other enabling machines and devices: Principal | ICD-10-CM

## 2017-08-01 DIAGNOSIS — G4733 Obstructive sleep apnea (adult) (pediatric): Secondary | ICD-10-CM

## 2017-08-02 NOTE — Addendum Note (Signed)
Addended by: Lorretta Harp on: 08/02/2017 01:47 PM   Modules accepted: Orders

## 2017-08-02 NOTE — Telephone Encounter (Signed)
Ok to order DME please change pressure back to 8 cwp     Dx OSA

## 2017-08-02 NOTE — Telephone Encounter (Signed)
Received an email from pt who is requesting to have his pressure changed back to 8cm.  Pt states that pressure was more comfortable for him.  Dr. Annamaria Boots, please advise if you are fine with [place] decreasing pt's pressure back to 8cm.  Thanks!

## 2018-01-22 ENCOUNTER — Encounter: Payer: Self-pay | Admitting: Internal Medicine

## 2018-01-22 ENCOUNTER — Telehealth: Payer: Self-pay | Admitting: Internal Medicine

## 2018-01-22 NOTE — Telephone Encounter (Signed)
Added to HM

## 2018-01-22 NOTE — Telephone Encounter (Signed)
Pt would like it documented that he had his Colonoscopy on 07/26/17 with Dr. Posey Pronto at [facility].

## 2018-01-29 ENCOUNTER — Ambulatory Visit: Payer: Medicare Other | Admitting: Internal Medicine

## 2018-07-17 ENCOUNTER — Ambulatory Visit: Payer: Medicare Other | Admitting: Internal Medicine

## 2018-08-29 ENCOUNTER — Ambulatory Visit: Payer: Medicare Other | Admitting: Internal Medicine

## 2018-09-05 ENCOUNTER — Other Ambulatory Visit: Payer: Self-pay

## 2018-09-05 ENCOUNTER — Encounter: Payer: Self-pay | Admitting: Internal Medicine

## 2018-09-05 ENCOUNTER — Ambulatory Visit: Payer: Medicare HMO | Admitting: Internal Medicine

## 2018-09-05 VITALS — BP 116/70 | HR 68 | Ht 67.5 in | Wt 164.0 lb

## 2018-09-05 DIAGNOSIS — Z9989 Dependence on other enabling machines and devices: Secondary | ICD-10-CM | POA: Diagnosis not present

## 2018-09-05 DIAGNOSIS — I2510 Atherosclerotic heart disease of native coronary artery without angina pectoris: Secondary | ICD-10-CM

## 2018-09-05 DIAGNOSIS — G4733 Obstructive sleep apnea (adult) (pediatric): Secondary | ICD-10-CM

## 2018-09-05 NOTE — Progress Notes (Signed)
HPI male never smoker followed for OSA, Insomnia complicated by CAD, GERD NPSG 12/30/2013-AHI 19/hour, desaturation to 88%, body weight 165 pounds. ----------------------------------------------------------------------------  07/18/17-73 year old male never smoker followed for OSA, Insomnia complicated by CAD, GERD, -----f/u CPAP, doing well  CPAP auto 12-16/[facility] Requests TDAP Download 87% AHI 3.2/hour.  We discussed mask fit and comfort and he would like a mask fitting at the sleep center.  He sleeps better with CPAP and wife confirms it prevents his snoring. Discussed seasonal allergic rhinitis- flonase and claritin.   09/05/2018- 73 year old male never smoker followed for OSA, Insomnia complicated by CAD, GERD, CPAP auto 12-16- [facility] Download 97% compliance, AHI 2/ hr Doing well, comfortable with nasal pillows. We reviewed download.  Old machine can be replaced.  ROS-see HPI   + = positive Constitutional:    weight loss, night sweats, fevers, chills, fatigue, lassitude. HEENT:    headaches, difficulty swallowing, tooth/dental problems, sore throat,       sneezing, itching, ear ache, + nasal congestion, post nasal drip, snoring CV:    chest pain, orthopnea, PND, swelling in lower extremities, anasarca,                                                       dizziness, palpitations Resp:   shortness of breath with exertion or at rest.                productive cough,   non-productive cough, coughing up of blood.              change in color of mucus.  wheezing.   Skin:    rash or lesions. GI:  No-   heartburn, indigestion, abdominal pain, nausea, vomiting, diarrhea,                 change in bowel habits, loss of appetite GU: dysuria, change in color of urine, no urgency or frequency.   flank pain. MS:   joint pain, stiffness, decreased range of motion, back pain. Neuro-     nothing unusual Psych:  change in mood or affect.  depression or anxiety.   memory loss.  OBJ- Physical Exam    not obese General- Alert, Oriented, Affect-appropriate, Distress- none acute Skin- rash-none, lesions- none, excoriation- none Lymphadenopathy- none Head- atraumatic            Eyes- Gross vision intact, PERRLA, conjunctivae and secretions clear            Ears- Hearing, canals-normal            Nose- Clear, no-Septal dev, mucus, polyps, erosion, perforation             Throat- Mallampati II , mucosa clear , drainage- none, tonsils- atrophic Neck- flexible , trachea midline, no stridor , thyroid nl, carotid no bruit Chest - symmetrical excursion , unlabored           Heart/CV- RRR , no murmur , no gallop  , no rub, nl s1 s2                           - JVD- none , edema- none, stasis changes- none, varices- none           Lung- clear to P&A, wheeze- none, cough- none , dullness-none, rub- none  Chest wall-  Abd-  Br/ Gen/ Rectal- Not done, not indicated Extrem- cyanosis- none, clubbing, none, atrophy- none, strength- nl Neuro- grossly intact to observation

## 2018-09-05 NOTE — Patient Instructions (Signed)
Order- DME [facility]- please replace old CPAP machine, continue auto 12-16, mask of choice, humidifier, supplies, Airview/ card  Please call if we can help

## 2018-09-06 ENCOUNTER — Telehealth: Payer: Self-pay | Admitting: Internal Medicine

## 2018-09-06 NOTE — Telephone Encounter (Signed)
Left message for patient to call back  

## 2018-09-09 NOTE — Telephone Encounter (Signed)
Called and spoke with Alan Wall with [facility] at [phone] Regarding pt is not eligible for new cpap machine until 01/17/2019  Called and spoke with patient advised of above information Pt expressed and verbalized understanding Pt will call our office back in 01/18/2019 in need of new cpap machine Nothing further needed.

## 2018-10-20 NOTE — Assessment & Plan Note (Signed)
Denies recent issues. Followed by cardiology

## 2018-10-20 NOTE — Assessment & Plan Note (Signed)
Continues to benefit from CPAP, confirmed by download. Plan- continue auto 12-16, replace old machine

## 2018-10-22 ENCOUNTER — Ambulatory Visit: Payer: Medicare Other | Admitting: Internal Medicine

## 2019-03-10 NOTE — Telephone Encounter (Signed)
Dr. Annamaria Boots, patient sent email regarding a new CPAP machine that was mentioned he might get in the fall. He wants to know what he has to do to get an order sent directly to [facility].  Last OV 09/05/2018 Dr. Annamaria Boots please advise on order for new CPAP.  Allergies  Allergen Reactions  . Bee Venom    Current Outpatient Medications on File Prior to Visit  Medication Sig Dispense Refill  . Chlorpheniramine-PSE-Ibuprofen (ADVIL ALLERGY SINUS) 2-30-200 MG TABS 1-2 tabs PO Q4-6 hrs PRN 30 each 1  . cholecalciferol (VITAMIN D) 400 units TABS tablet Take 400 Units by mouth daily.    . fluticasone (FLONASE) 50 MCG/ACT nasal spray   4  . nystatin cream (MYCOSTATIN) Apply topically 2 (two) times daily. 30 g 3  . pravastatin (PRAVACHOL) 40 MG tablet TAKE 1 TABLET BY MOUTH ONCE DAILY 90 tablet 1   No current facility-administered medications on file prior to visit.

## 2019-03-10 NOTE — Telephone Encounter (Signed)
Please order DME [facility]- replace old CPAP machine auto 12-16, mask of choice, humidifier, supplies, AirView/ card

## 2019-03-13 ENCOUNTER — Telehealth: Payer: Self-pay | Admitting: Internal Medicine

## 2019-03-13 DIAGNOSIS — G4733 Obstructive sleep apnea (adult) (pediatric): Secondary | ICD-10-CM

## 2019-03-13 NOTE — Telephone Encounter (Signed)
Dr. Annamaria Boots has already requested that an order be placed for a replacement CPAP. I am placing the order now to go to [facility]. Nothing further is needed.

## 2019-03-13 NOTE — Telephone Encounter (Signed)
Dr. Annamaria Boots,  There was an order placed today for replacement CPAP and supplies, but it looks like his last visit with you in May 2020 will not work for [employer].   Can he have a televisit with you 03/14/19? If he cannot be double booked let me know so I can add him to an NP. I talked to the patient and he was agreeable to what ever you wanted to do. Thank you much.

## 2019-03-13 NOTE — Telephone Encounter (Signed)
Dr. Annamaria Boots can we send in an order for a new CPAP?

## 2019-03-13 NOTE — Telephone Encounter (Signed)
Already double booking. Please help him with NP visit.

## 2019-03-14 ENCOUNTER — Ambulatory Visit (INDEPENDENT_AMBULATORY_CARE_PROVIDER_SITE_OTHER): Payer: Medicare HMO | Admitting: Pulmonary Disease

## 2019-03-14 ENCOUNTER — Other Ambulatory Visit: Payer: Self-pay

## 2019-03-14 ENCOUNTER — Encounter: Payer: Self-pay | Admitting: Pulmonary Disease

## 2019-03-14 DIAGNOSIS — G4733 Obstructive sleep apnea (adult) (pediatric): Secondary | ICD-10-CM

## 2019-03-14 DIAGNOSIS — Z9989 Dependence on other enabling machines and devices: Secondary | ICD-10-CM

## 2019-03-14 NOTE — Assessment & Plan Note (Signed)
Plan: We will order new CPAP today Same settings  continue to use CPAP every night Follow-up with our office 1 year

## 2019-03-14 NOTE — Addendum Note (Signed)
Addended by: Valerie Salts on: 03/14/2019 02:47 PM   Modules accepted: Orders

## 2019-03-14 NOTE — Progress Notes (Signed)
Virtual Visit via Telephone Note  I connected with Alan Wall on 03/14/19 at  2:00 PM EST by telephone and verified that I am speaking with the correct person using two identifiers.  Location: Patient: Home Provider: Office [facility] - [id number], [place], [place] [address]   I discussed the limitations, risks, security and privacy concerns of performing an evaluation and management service by telephone and the availability of in person appointments. I also discussed with the patient that there may be a patient responsible charge related to this service. The patient expressed understanding and agreed to proceed.  Patient consented to consult via telephone: Yes People present and their role in pt care: Pt     History of Present Illness:  73 year old male never smoker followed in our office for moderate obstructive sleep apnea  Past medical history: Insomnia, GERD, CAD Smoking history: Never smoker Maintenance: None Patient of Dr. Annamaria Boots  Chief complaint: can get new cpap   73 year old male never smoker completing a telephonic visit with our office today in order to obtain a new CPAP.  Patient has had his CPAP longer than 5 years.  His DME is requiring a telephonic visit today.  CPAP compliance report shows excellent compliance see compliance report listed below:  02/12/2019-03/13/2019 20-30 on the last 30 days use, all 30 those days greater than 4 hours, average usage 5 hours and 51 minutes, APAP setting 12-16, AHI 1.7  Patient reports no current issues with his CPAP.  Observations/Objective:  NPSG 12/30/2013-AHI 19/hour, desaturation to 88%, body weight 165 pounds.  Assessment and Plan:  OSA on CPAP Plan: We will order new CPAP today Same settings  continue to use CPAP every night Follow-up with our office 1 year   Follow Up Instructions:  Return in about 1 year (around 03/13/2020), or if symptoms worsen or fail to improve, for Follow up with  Dr. Annamaria Boots.   I discussed the assessment and treatment plan with the patient. The patient was provided an opportunity to ask questions and all were answered. The patient agreed with the plan and demonstrated an understanding of the instructions.   The patient was advised to call back or seek an in-person evaluation if the symptoms worsen or if the condition fails to improve as anticipated.  I provided 16 minutes of non-face-to-face time during this encounter.   Lauraine Rinne, NP

## 2019-03-14 NOTE — Telephone Encounter (Signed)
Spoke with pt, scheduled for a televisit with Aaron Edelman today at 2:00 for cpap coverage.  Nothing further needed at this time- will close encounter.

## 2019-03-14 NOTE — Patient Instructions (Addendum)
You were seen today by Lauraine Rinne, NP  for:   1. OSA on CPAP  New CPAP order  Same settings  Supplies  Mask choice   We recommend that you continue using your CPAP daily >>>Keep up the hard work using your device >>> Goal should be wearing this for the entire night that you are sleeping, at least 4 to 6 hours  Remember:   Do not drive or operate heavy machinery if tired or drowsy.   Please notify the supply company and office if you are unable to use your device regularly due to missing supplies or machine being broken.   Work on maintaining a healthy weight and following your recommended nutrition plan   Maintain proper daily exercise and movement   Maintaining proper use of your device can also help improve management of other chronic illnesses such as: Blood pressure, blood sugars, and weight management.   BiPAP/ CPAP Cleaning:  >>>Clean weekly, with Dawn soap, and bottle brush.  Set up to air dry.    Follow Up:    Return in about 1 year (around 03/13/2020), or if symptoms worsen or fail to improve, for Follow up with Dr. Annamaria Boots.   Please do your part to reduce the spread of COVID-19:      Reduce your risk of any infection  and COVID19 by using the similar precautions used for avoiding the common cold or flu:   Wash your hands often with soap and warm water for at least 20 seconds.  If soap and water are not readily available, use an alcohol-based hand sanitizer with at least 60% alcohol.   If coughing or sneezing, cover your mouth and nose by coughing or sneezing into the elbow areas of your shirt or coat, into a tissue or into your sleeve (not your hands).  WEAR A MASK when in public   Avoid shaking hands with others and consider head nods or verbal greetings only.  Avoid touching your eyes, nose, or mouth with unwashed hands.   Avoid close contact with people who are sick.  Avoid places or events with large numbers of people in one location, like concerts  or sporting events.  If you have some symptoms but not all symptoms, continue to monitor at home and seek medical attention if your symptoms worsen.  If you are having a medical emergency, call 911.   [place] / e-Visit: [email or web address]         [facility]: [facility]: [id number]                   [facility]: [id number]     It is flu season:   >>> Best ways to protect herself from the flu: Receive the yearly flu vaccine, practice good hand hygiene washing with soap and also using hand sanitizer when available, eat a nutritious meals, get adequate rest, hydrate appropriately   Please contact the office if your symptoms worsen or you have concerns that you are not improving.   Thank you for choosing [facility] for your healthcare, and for allowing [place] to partner with you on your healthcare journey. I am thankful to be able to provide care to you today.   Wyn Quaker FNP-C    Sleep Apnea Sleep apnea affects breathing during sleep. It causes breathing to stop for a short time or to become shallow. It can also increase  the risk of:  Heart attack.  Stroke.  Being very overweight (obese).  Diabetes.  Heart failure.  Irregular heartbeat. The goal of treatment is to help you breathe normally again. What are the causes? There are three kinds of sleep apnea:  Obstructive sleep apnea. This is caused by a blocked or collapsed airway.  Central sleep apnea. This happens when the brain does not send the right signals to the muscles that control breathing.  Mixed sleep apnea. This is a combination of obstructive and central sleep apnea. The most common cause of this condition is a collapsed or blocked airway. This can happen if:  Your throat muscles are too relaxed.  Your tongue and tonsils are too  large.  You are overweight.  Your airway is too small. What increases the risk?  Being overweight.  Smoking.  Having a small airway.  Being older.  Being male.  Drinking alcohol.  Taking medicines to calm yourself (sedatives or tranquilizers).  Having family members with the condition. What are the signs or symptoms?  Trouble staying asleep.  Being sleepy or tired during the day.  Getting angry a lot.  Loud snoring.  Headaches in the morning.  Not being able to focus your mind (concentrate).  Forgetting things.  Less interest in sex.  Mood swings.  Personality changes.  Feelings of sadness (depression).  Waking up a lot during the night to pee (urinate).  Dry mouth.  Sore throat. How is this diagnosed?  Your medical history.  A physical exam.  A test that is done when you are sleeping (sleep study). The test is most often done in a sleep lab but may also be done at home. How is this treated?   Sleeping on your side.  Using a medicine to get rid of mucus in your nose (decongestant).  Avoiding the use of alcohol, medicines to help you relax, or certain pain medicines (narcotics).  Losing weight, if needed.  Changing your diet.  Not smoking.  Using a machine to open your airway while you sleep, such as: ? An oral appliance. This is a mouthpiece that shifts your lower jaw forward. ? A CPAP device. This device blows air through a mask when you breathe out (exhale). ? An EPAP device. This has valves that you put in each nostril. ? A BPAP device. This device blows air through a mask when you breathe in (inhale) and breathe out.  Having surgery if other treatments do not work. It is important to get treatment for sleep apnea. Without treatment, it can lead to:  High blood pressure.  Coronary artery disease.  In men, not being able to have an erection (impotence).  Reduced thinking ability. Follow these instructions at  home: Lifestyle  Make changes that your doctor recommends.  Eat a healthy diet.  Lose weight if needed.  Avoid alcohol, medicines to help you relax, and some pain medicines.  Do not use any products that contain nicotine or tobacco, such as cigarettes, e-cigarettes, and chewing tobacco. If you need help quitting, ask your doctor. General instructions  Take over-the-counter and prescription medicines only as told by your doctor.  If you were given a machine to use while you sleep, use it only as told by your doctor.  If you are having surgery, make sure to tell your doctor you have sleep apnea. You may need to bring your device with you.  Keep all follow-up visits as told by your doctor. This is important. Contact a doctor  if:  The machine that you were given to use during sleep bothers you or does not seem to be working.  You do not get better.  You get worse. Get help right away if:  Your chest hurts.  You have trouble breathing in enough air.  You have an uncomfortable feeling in your back, arms, or stomach.  You have trouble talking.  One side of your body feels weak.  A part of your face is hanging down. These symptoms may be an emergency. Do not wait to see if the symptoms will go away. Get medical help right away. Call your local emergency services (911 in the [place] Do not drive yourself to the hospital. Summary  This condition affects breathing during sleep.  The most common cause is a collapsed or blocked airway.  The goal of treatment is to help you breathe normally while you sleep. This information is not intended to replace advice given to you by your health care provider. Make sure you discuss any questions you have with your health care provider. Document Released: 01/18/2008 Document Revised: 01/25/2018 Document Reviewed: 12/04/2017 Elsevier Patient Education  2020 [employer].

## 2019-05-07 ENCOUNTER — Ambulatory Visit: Payer: Medicare Other | Attending: Internal Medicine

## 2019-05-07 DIAGNOSIS — Z23 Encounter for immunization: Secondary | ICD-10-CM | POA: Insufficient documentation

## 2019-05-07 NOTE — Progress Notes (Signed)
   Covid-19 Vaccination Clinic  Name:  Alan Wall    MRN: [id number] DOB: 07/12/1945  05/07/2019  Alan Wall was observed post Covid-19 immunization for 15 minutes without incidence. He was provided with Vaccine Information Sheet and instruction to access the V-Safe system.   Alan Wall was instructed to call 911 with any severe reactions post vaccine: Marland Kitchen Difficulty breathing  . Swelling of your face and throat  . A fast heartbeat  . A bad rash all over your body  . Dizziness and weakness    Immunizations Administered    Name Date Dose VIS Date Route   Pfizer COVID-19 Vaccine 05/07/2019  9:47 AM 0.3 mL 04/04/2019 Intramuscular   Manufacturer: [place]   Lot: [id number]   [place]: [id number]

## 2019-05-27 ENCOUNTER — Ambulatory Visit: Payer: Medicare HMO | Attending: Internal Medicine

## 2019-05-27 DIAGNOSIS — Z23 Encounter for immunization: Secondary | ICD-10-CM

## 2019-05-27 NOTE — Progress Notes (Signed)
   Covid-19 Vaccination Clinic  Name:  STELIOS MCGHIE    MRN: [id number] DOB: 04-03-1946  05/27/2019  Mr. Randles was observed post Covid-19 immunization for 15 minutes without incidence. He was provided with Vaccine Information Sheet and instruction to access the V-Safe system.   Mr. Swineford was instructed to call 911 with any severe reactions post vaccine: Marland Kitchen Difficulty breathing  . Swelling of your face and throat  . A fast heartbeat  . A bad rash all over your body  . Dizziness and weakness    Immunizations Administered    Name Date Dose VIS Date Route   Pfizer COVID-19 Vaccine 05/27/2019  9:44 AM 0.3 mL 04/04/2019 Intramuscular   Manufacturer: [place]   Lot: [id number]   [place]: [id number]

## 2019-09-08 ENCOUNTER — Ambulatory Visit: Payer: Medicare HMO | Admitting: Internal Medicine

## 2019-10-13 ENCOUNTER — Other Ambulatory Visit: Payer: Self-pay

## 2019-10-13 ENCOUNTER — Ambulatory Visit (INDEPENDENT_AMBULATORY_CARE_PROVIDER_SITE_OTHER): Payer: Medicare HMO | Admitting: Internal Medicine

## 2019-10-13 ENCOUNTER — Telehealth: Payer: Self-pay | Admitting: Internal Medicine

## 2019-10-13 ENCOUNTER — Encounter: Payer: Self-pay | Admitting: Internal Medicine

## 2019-10-13 VITALS — BP 120/70 | HR 64 | Temp 97.2°F | Ht 67.0 in | Wt 162.4 lb

## 2019-10-13 DIAGNOSIS — Z9989 Dependence on other enabling machines and devices: Secondary | ICD-10-CM | POA: Diagnosis not present

## 2019-10-13 DIAGNOSIS — G4700 Insomnia, unspecified: Secondary | ICD-10-CM

## 2019-10-13 DIAGNOSIS — G4733 Obstructive sleep apnea (adult) (pediatric): Secondary | ICD-10-CM | POA: Diagnosis not present

## 2019-10-13 NOTE — Progress Notes (Signed)
HPI male never smoker followed for OSA, Insomnia complicated by CAD, GERD NPSG 12/30/2013-AHI 19/hour, desaturation to 88%, body weight 165 pounds. ----------------------------------------------------------------------------   09/05/2018- 74 year old male never smoker followed for OSA, Insomnia complicated by CAD, GERD, CPAP auto 12-16- [employer] Download 97% compliance, AHI 2/ hr Doing well, comfortable with nasal pillows. We reviewed download.  Old machine can be replaced.  10/13/19- 74 year old male never smoker followed for OSA, Insomnia complicated by CAD, GERD, Allergic Rhinitis,  CPAP auto 12-16/ [employer]   Replaced in 2020 Body weight today- 162 lbs Download compliance 73%, AHI 1.2/ hr   Short nights Using CPAP every night, but some nights are short- discussed.  Option to refer for mask fitting and to buy supplies on-line as discussed. Covid vax- 2 Phizer  ROS-see HPI   + = positive Constitutional:    weight loss, night sweats, fevers, chills, fatigue, lassitude. HEENT:    headaches, difficulty swallowing, tooth/dental problems, sore throat,       sneezing, itching, ear ache, + nasal congestion, post nasal drip, snoring CV:    chest pain, orthopnea, PND, swelling in lower extremities, anasarca,                                                       dizziness, palpitations Resp:   shortness of breath with exertion or at rest.                productive cough,   non-productive cough, coughing up of blood.              change in color of mucus.  wheezing.   Skin:    rash or lesions. GI:  No-   heartburn, indigestion, abdominal pain, nausea, vomiting, diarrhea,                 change in bowel habits, loss of appetite GU: dysuria, change in color of urine, no urgency or frequency.   flank pain. MS:   joint pain, stiffness, decreased range of motion, back pain. Neuro-     nothing unusual Psych:  change in mood or affect.  depression or anxiety.   memory loss.  OBJ- Physical Exam   not  obese General- Alert, Oriented, Affect-appropriate, Distress- none acute, not oese Skin- rash-none, lesions- none, excoriation- none Lymphadenopathy- none Head- atraumatic            Eyes- Gross vision intact, PERRLA, conjunctivae and secretions clear            Ears- Hearing, canals-normal            Nose- Clear, no-Septal dev, mucus, polyps, erosion, perforation             Throat- Mallampati II , mucosa clear , drainage- none, tonsils- atrophic Neck- flexible , trachea midline, no stridor , thyroid nl, carotid no bruit Chest - symmetrical excursion , unlabored           Heart/CV- RRR , no murmur , no gallop  , no rub, nl s1 s2                           - JVD- none , edema- none, stasis changes- none, varices- none           Lung- clear to P&A, wheeze- none, cough-  none , dullness-none, rub- none           Chest wall-  Abd-  Br/ Gen/ Rectal- Not done, not indicated Extrem- cyanosis- none, clubbing, none, atrophy- none, strength- nl Neuro- grossly intact to observation

## 2019-10-13 NOTE — Patient Instructions (Addendum)
We can continue CPAP auto 12-16, mask of choice, humidifier, supplies, AirView/ card  We do have an option to refer you to the sleep center for help with mask fitting if needed.  Order- Printed script:      CPAP mask of choice, humidifier, filters, hoses, supplies       Dx OSA      ( You can order on-line as discussed, from companies like CPAP.com, [employer], etc.)  Please call if we can help

## 2019-10-14 NOTE — Telephone Encounter (Signed)
Download has been printed and placed in CY's inbox.

## 2019-10-20 NOTE — Telephone Encounter (Signed)
CY - please advise. Thanks! 

## 2019-10-20 NOTE — Telephone Encounter (Signed)
Thanks

## 2019-11-04 ENCOUNTER — Ambulatory Visit: Payer: Medicare HMO

## 2019-11-04 DIAGNOSIS — G4733 Obstructive sleep apnea (adult) (pediatric): Secondary | ICD-10-CM

## 2019-11-04 NOTE — Telephone Encounter (Signed)
CY can we send order for mask fit?

## 2019-11-04 NOTE — Telephone Encounter (Signed)
Please schedule CPAP mask fitting at sleep disorders center

## 2019-11-13 ENCOUNTER — Ambulatory Visit (HOSPITAL_BASED_OUTPATIENT_CLINIC_OR_DEPARTMENT_OTHER): Payer: Medicare HMO | Attending: Internal Medicine | Admitting: Internal Medicine

## 2019-11-13 ENCOUNTER — Other Ambulatory Visit: Payer: Self-pay

## 2019-11-13 ENCOUNTER — Other Ambulatory Visit (HOSPITAL_BASED_OUTPATIENT_CLINIC_OR_DEPARTMENT_OTHER): Payer: Medicare HMO | Admitting: Internal Medicine

## 2019-11-13 ENCOUNTER — Ambulatory Visit: Payer: Medicare HMO | Attending: Internal Medicine

## 2019-11-13 DIAGNOSIS — G8929 Other chronic pain: Secondary | ICD-10-CM | POA: Diagnosis present

## 2019-11-13 DIAGNOSIS — M545 Low back pain: Secondary | ICD-10-CM | POA: Diagnosis present

## 2019-11-13 DIAGNOSIS — M6281 Muscle weakness (generalized): Secondary | ICD-10-CM | POA: Diagnosis present

## 2019-11-13 DIAGNOSIS — G4733 Obstructive sleep apnea (adult) (pediatric): Secondary | ICD-10-CM

## 2019-11-13 NOTE — Patient Instructions (Signed)
Access Code: [email or web address] URL: [email or web address] Date: 11/13/2019 Prepared by: Claiborne Billings  Exercises Hooklying Single Knee to Chest - 3 x daily - 7 x weekly - 3 reps - 1 sets - 20 hold Seated Hamstring Stretch - 3 x daily - 7 x weekly - 3 reps - 1 sets - 20 hold Sidelying Open Book Thoracic Rotation with Knee on Foam Roll - 3 x daily - 7 x weekly - 1 sets - 3 reps - 20 hold Beginner Clam - 2 x daily - 7 x weekly - 2 sets - 10 reps Seated Transversus Abdominis Bracing - 5 x daily - 7 x weekly - 1 sets - 2 reps - 5 hold Seated Figure 4 Piriformis Stretch - 3 x daily - 7 x weekly - 1 sets - 3 reps - 20 hold

## 2019-11-13 NOTE — Therapy (Signed)
[facility] [address] [place], [place], [address] Phone: [phone]   Fax:  [phone]  Physical Therapy Evaluation  Patient Details  Name: Alan Wall MRN: [id number] Date of Birth: Aug 13, 1945 Referring Provider (PT): Corliss Marcus, MD   Encounter Date: 11/13/2019   PT End of Session - 11/13/19 0937    Visit Number 1    Date for PT Re-Evaluation 01/08/20    Authorization Type [employer] Medicare    Progress Note Due on Visit 10    PT Start Time 0847    PT Stop Time 0934    PT Time Calculation (min) 47 min    Activity Tolerance Patient tolerated treatment well    Behavior During Therapy [facility] for tasks assessed/performed           Past Medical History:  Diagnosis Date   Allergy    Food/cat dander/bee stings   Apnea    Hematoma of leg    Hyperlipidemia    S/P dilatation of esophageal stricture     Past Surgical History:  Procedure Laterality Date   CATARACT EXTRACTION W/ INTRAOCULAR LENS  IMPLANT, BILATERAL  2010    There were no vitals filed for this visit.    Subjective Assessment - 11/13/19 0850    Subjective Pt presents to PT with a 3 year history of intermittent LBP.  Pt had hernia repair 08/15/19 and had to stop some of his core exercise during his 6 week recovery.  Pt exercises at [facility].    Pertinent History hernia repair 08/15/19    Diagnostic tests none    Patient Stated Goals reduce LBP, learn exercises to reduce LBP    Currently in Pain? No/denies    Pain Score --   up to 9/10 intermittently with bending over   Pain Location Back    Pain Orientation Right;Left;Lower    Pain Descriptors / Indicators Shooting    Pain Type Chronic pain    Pain Onset More than a month ago    Pain Frequency Intermittent    Aggravating Factors  standing, bending over to lift, twisting    Pain Relieving Factors staying neutral, avoiding aggravating activities              [facility] PT Assessment -  11/13/19 0001      Assessment   Medical Diagnosis back pain s/p hernia repair    Referring Provider (PT) Corliss Marcus, MD    Onset Date/Surgical Date 11/12/16    Next MD Visit 3 months       Precautions   Precautions None      Restrictions   Weight Bearing Restrictions No      Balance Screen   Has the patient fallen in the past 6 months No    Has the patient had a decrease in activity level because of a fear of falling?  No    Is the patient reluctant to leave their home because of a fear of falling?  No      Home Ecologist residence    Living Arrangements Spouse/significant other      Prior Function   Level of [place] Retired    Leisure exercise at [employer],       Cognition   Overall Cognitive Status Within Functional Limits for tasks assessed      Observation/Other Assessments   Focus on Therapeutic Outcomes (FOTO)  38% limitation  Posture/Postural Control   Posture/Postural Control Postural limitations    Postural Limitations Forward head;Decreased lumbar lordosis      ROM / Strength   AROM / PROM / Strength AROM;PROM;Strength      AROM   Overall AROM  Within functional limits for tasks performed    Overall AROM Comments full lumbar A/ROM without pain      PROM   Overall PROM  Deficits    Overall PROM Comments hamstring length limited by 25%. IR limited by 25% at bil hips.      Strength   Overall Strength Deficits    Overall Strength Comments 4/5 core strength, 4+/5 hip abduction.  All other LE strength 5/5      Palpation   Palpation comment reduced PA mobility without pain.  tension in bil lumbar paraspinals and upper gluteals without pain       Transfers   Transfers Independent with all Transfers      Ambulation/Gait   Gait Pattern Within Functional Limits                      Objective measurements completed on examination: See above findings.               PT  Education - 11/13/19 0930    Education Details Access Code: [email or web address]    Person(s) Educated Patient    Methods Explanation;Demonstration;Handout    Comprehension Verbalized understanding;Returned demonstration            PT Short Term Goals - 11/13/19 0900      PT SHORT TERM GOAL #1   Title be independent in initial HEP    Time 4    Period Weeks    Status New    Target Date 12/11/19      PT SHORT TERM GOAL #2   Title report a 30% reduction in LBP with ADLs and self-care    Time 4    Period Weeks    Status New    Target Date 12/11/19      PT SHORT TERM GOAL #3   Title demonstrate and verbalize correct body mechanics modifications and report corrections throughout the day    Time 4    Period Weeks    Status New    Target Date 12/11/19             PT Long Term Goals - 11/13/19 0949      PT LONG TERM GOAL #1   Title be independent in advanced HEP    Time 8    Period Weeks    Status New    Target Date 01/08/20      PT LONG TERM GOAL #2   Title reduce FOTO to < or = to 29% limitation    Time 8    Period Weeks    Status New    Target Date 01/08/20      PT LONG TERM GOAL #3   Title report a 60% reduction in LBP with self-care and ADLs    Time 8    Period Weeks    Status New      PT LONG TERM GOAL #4   Title verbalize body mechanics modifications and safe progression of exercise for lumbar protection    Time 8    Period Weeks    Status New    Target Date 01/08/20                  Plan -  11/13/19 0954    Clinical Impression Statement Pt presents to PT with a 3 year history of intermittent LPB with some increased pain s/p hernia repair performed on 08/15/19 due to immobility after surgery.   Pt describes up to 9/10 LBP without radiculopathy with standing, twisting or bending forward to lift.  Today, pain is 0/10 as pt has started being careful with his movements and has started an at home Pilates core workout.  Pt demonstrates full lumbar A/ROM without  pain and limited P/ROM in bil hips without pain.  Pt with core and hip weakness and tension in bil lumbar paraspinals and proximal gluteals. Pt will benefit from skilled PT for body mechanics education, core and hip strength, flexibility and dry needling to address tension.    Personal Factors and Comorbidities Comorbidity 1    Comorbidities hernia repair    Examination-Activity Limitations Lift;Locomotion Level;Stand    Examination-Participation Restrictions Laundry;Meal Prep;Shop    Stability/Clinical Decision Making Stable/Uncomplicated    Clinical Decision Making Low    Rehab Potential Excellent    PT Frequency 2x / week    PT Duration 8 weeks    PT Treatment/Interventions ADLs/Self Care Home Management;Cryotherapy;Electrical Stimulation;Moist Heat;Neuromuscular re-education;Therapeutic exercise;Therapeutic activities;Functional mobility training;Patient/family education;Manual techniques;Passive range of motion;Taping;Dry needling;Joint Manipulations;Spinal Manipulations    PT Next Visit Plan dry needling to bil lumbar spine and gluteals, core and hip strength, flexibility, body mechanics education    PT Home Exercise Plan Access Code: [email or web address]    Consulted and Agree with Plan of Care Patient           Patient will benefit from skilled therapeutic intervention in order to improve the following deficits and impairments:  Decreased activity tolerance, Decreased strength, Postural dysfunction, Improper body mechanics, Impaired flexibility, Pain, Increased muscle spasms, Difficulty walking  Visit Diagnosis: Chronic bilateral low back pain without sciatica - Plan: PT plan of care cert/re-cert  Muscle weakness (generalized) - Plan: PT plan of care cert/re-cert     Problem List Patient Active Problem List   Diagnosis Date Noted   Seasonal and perennial allergic rhinitis 07/18/2016   Intertrigo 03/21/2016   Right arm numbness 12/31/2015   Need for prophylactic vaccination against  Streptococcus pneumoniae (pneumococcus) 12/31/2015   Neoplasm of uncertain behavior of skin 01/26/2015   OSA on CPAP 01/25/2015   CAD (coronary artery disease) 01/25/2015   Well adult exam 05/20/2012   Pruritus ani 02/13/2012   Stress 02/13/2012   Wound, open, forearm 01/26/2011   Fall against object 01/25/2011   Degloving injury 01/25/2011   Flank pain, acute 01/25/2011   Knee pain, acute 01/25/2011   Concussion 01/25/2011   Vertigo 12/13/2010   INSOMNIA, PERSISTENT 11/23/2008   ALLERGY, FOOD 11/23/2008   GERD 04/16/2007   ERECTILE DYSFUNCTION 04/16/2007   Dyslipidemia 01/31/2007   Sigurd Sos, PT 11/13/19 10:00 AM  [facility] [address], [place], [place], [address] Phone: [phone]   Fax:  [phone]  Name: Alan Wall MRN: [id number] Date of Birth: Oct 29, 1945

## 2019-11-18 ENCOUNTER — Ambulatory Visit: Payer: Medicare HMO

## 2019-11-18 ENCOUNTER — Other Ambulatory Visit: Payer: Self-pay

## 2019-11-18 DIAGNOSIS — M545 Low back pain, unspecified: Secondary | ICD-10-CM

## 2019-11-18 DIAGNOSIS — M6281 Muscle weakness (generalized): Secondary | ICD-10-CM

## 2019-11-18 NOTE — Patient Instructions (Signed)

## 2019-11-18 NOTE — Therapy (Signed)
[facility] [address], [place], [place], [address] Phone: [phone]   Fax:  [phone]  Physical Therapy Treatment  Patient Details  Name: Alan Wall MRN: [id number] Date of Birth: 06-May-1945 Referring Provider (PT): Corliss Marcus, MD   Encounter Date: 11/18/2019   PT End of Session - 11/18/19 0900    Visit Number 2    Date for PT Re-Evaluation 01/08/20    Authorization Type [employer] Medicare    Progress Note Due on Visit 10    PT Start Time 0847    PT Stop Time 0927    PT Time Calculation (min) 40 min    Activity Tolerance Patient tolerated treatment well    Behavior During Therapy [facility] for tasks assessed/performed           Past Medical History:  Diagnosis Date   Allergy    Food/cat dander/bee stings   Apnea    Hematoma of leg    Hyperlipidemia    S/P dilatation of esophageal stricture     Past Surgical History:  Procedure Laterality Date   CATARACT EXTRACTION W/ INTRAOCULAR LENS  IMPLANT, BILATERAL  2010    There were no vitals filed for this visit.   Subjective Assessment - 11/18/19 0850    Subjective I am doing well.  No pain.    Currently in Pain? No/denies                             [facility] Adult PT Treatment/Exercise - 11/18/19 0001      Exercises   Exercises Lumbar;Knee/Hip      Lumbar Exercises: Stretches   Active Hamstring Stretch Left;Right;2 reps;20 seconds    Single Knee to Chest Stretch 2 reps;20 seconds;Left;Right    Lower Trunk Rotation Limitations open book stretch x10 each side    Piriformis Stretch Left;Right;3 reps;20 seconds    Piriformis Stretch Limitations seated      Knee/Hip Exercises: Sidelying   Clams 2x10       Manual Therapy   Manual Therapy Myofascial release;Soft tissue mobilization    Manual therapy comments monitoring and skilled palpation during dry needling today    Soft tissue mobilization bil gluteals and lumbar spine                   PT Education - 11/18/19 0854    Education Details DN info    Person(s) Educated Patient    Methods Explanation;Demonstration;Handout    Comprehension Verbalized understanding;Returned demonstration            PT Short Term Goals - 11/13/19 0900      PT SHORT TERM GOAL #1   Title be independent in initial HEP    Time 4    Period Weeks    Status New    Target Date 12/11/19      PT SHORT TERM GOAL #2   Title report a 30% reduction in LBP with ADLs and self-care    Time 4    Period Weeks    Status New    Target Date 12/11/19      PT SHORT TERM GOAL #3   Title demonstrate and verbalize correct body mechanics modifications and report corrections throughout the day    Time 4    Period Weeks    Status New    Target Date 12/11/19             PT Long  Term Goals - 11/13/19 0949      PT LONG TERM GOAL #1   Title be independent in advanced HEP    Time 8    Period Weeks    Status New    Target Date 01/08/20      PT LONG TERM GOAL #2   Title reduce FOTO to < or = to 29% limitation    Time 8    Period Weeks    Status New    Target Date 01/08/20      PT LONG TERM GOAL #3   Title report a 60% reduction in LBP with self-care and ADLs    Time 8    Period Weeks    Status New      PT LONG TERM GOAL #4   Title verbalize body mechanics modifications and safe progression of exercise for lumbar protection    Time 8    Period Weeks    Status New    Target Date 01/08/20                 Plan - 11/18/19 0858    Clinical Impression Statement The patient reports continued moderate bil knee pain which she states has been worsening over the last 2 weeks.  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   Pt is pursuing community options for ex including the PREP program and aquatic ex options.   Pt was able to perform sit to stand from black mat today with significant effort.  PT provided verbal and demo cueing for  technique with exercise today.    PT Frequency 2x / week    PT Duration 8 weeks    PT Treatment/Interventions ADLs/Self Care Home Management;Cryotherapy;Electrical Stimulation;Moist Heat;Neuromuscular re-education;Therapeutic exercise;Therapeutic activities;Functional mobility training;Patient/family education;Manual techniques;Passive range of motion;Taping;Dry needling;Joint Manipulations;Spinal Manipulations    PT Next Visit Plan assess response to DN, add strength to HEP, body mechanics education    PT Home Exercise Plan Access Code: [phone]           Patient will benefit from skilled therapeutic intervention in order to improve the following deficits and impairments:  Decreased activity tolerance, Decreased strength, Postural dysfunction, Improper body mechanics, Impaired flexibility, Pain, Increased muscle spasms, Difficulty walking  Visit Diagnosis: Chronic bilateral low back pain without sciatica  Muscle weakness (generalized)     Problem List Patient Active Problem List   Diagnosis Date Noted   Seasonal and perennial allergic rhinitis 07/18/2016   Intertrigo 03/21/2016   Right arm numbness 12/31/2015   Need for prophylactic vaccination against Streptococcus pneumoniae (pneumococcus) 12/31/2015   Neoplasm of uncertain behavior of skin 01/26/2015   OSA on CPAP 01/25/2015   CAD (coronary artery disease) 01/25/2015   Well adult exam 05/20/2012   Pruritus ani 02/13/2012   Stress 02/13/2012   Wound, open, forearm 01/26/2011   Fall against object 01/25/2011   Degloving injury 01/25/2011   Flank pain, acute 01/25/2011   Knee pain, acute 01/25/2011   Concussion 01/25/2011   Vertigo 12/13/2010   INSOMNIA, PERSISTENT 11/23/2008   ALLERGY, FOOD 11/23/2008   GERD 04/16/2007   ERECTILE DYSFUNCTION 04/16/2007   Dyslipidemia 01/31/2007     Sigurd Sos, PT 11/18/19 9:31 AM  [facility] [address], [place], [place], [address] Phone: [phone]   Fax:  [phone]  Name: Alan Wall MRN: [id number] Date of Birth: 1945-12-09

## 2019-11-20 ENCOUNTER — Ambulatory Visit: Payer: Medicare HMO

## 2019-11-20 ENCOUNTER — Other Ambulatory Visit: Payer: Self-pay

## 2019-11-20 DIAGNOSIS — M545 Low back pain: Secondary | ICD-10-CM | POA: Diagnosis not present

## 2019-11-20 DIAGNOSIS — M6281 Muscle weakness (generalized): Secondary | ICD-10-CM

## 2019-11-20 DIAGNOSIS — G8929 Other chronic pain: Secondary | ICD-10-CM

## 2019-11-20 NOTE — Patient Instructions (Signed)
Access Code: [email or web address] URL: [email or web address] Date: 11/20/2019 Prepared by: Claiborne Billings  Exercises Supine Shoulder Horizontal Abduction with Resistance - 1 x daily - 7 x weekly - 2 sets - 10 reps Supine Bilateral Shoulder External Rotation with Resistance - 1 x daily - 7 x weekly - 2 sets - 10 reps Supine PNF D2 Flexion with Resistance - 1 x daily - 7 x weekly - 2 sets - 10 reps Standing Anti-Rotation Press with Anchored Resistance - 1 x daily - 7 x weekly - 2 sets - 10 reps

## 2019-11-20 NOTE — Therapy (Addendum)
[facility] [address] [place], [place], [address] Phone: [phone]   Fax:  [phone]  Physical Therapy Treatment  Patient Details  Name: Alan Wall MRN: [id number] Date of Birth: 1946-04-20 Referring Provider (PT): Corliss Marcus, MD   Encounter Date: 11/20/2019   PT End of Session - 11/20/19 0922    Visit Number 3    Date for PT Re-Evaluation 01/08/20    Authorization Type [employer] Medicare    Progress Note Due on Visit 10    PT Start Time [phone]    PT Stop Time 0921    PT Time Calculation (min) 39 min    Activity Tolerance Patient tolerated treatment well    Behavior During Therapy [facility] for tasks assessed/performed           Past Medical History:  Diagnosis Date  . Allergy    Food/cat dander/bee stings  . Apnea   . Hematoma of leg   . Hyperlipidemia   . S/P dilatation of esophageal stricture     Past Surgical History:  Procedure Laterality Date  . CATARACT EXTRACTION W/ INTRAOCULAR LENS  IMPLANT, BILATERAL  2010    There were no vitals filed for this visit.   Subjective Assessment - 11/20/19 0844    Subjective I'm doing well today.  I did Pilates online.  I'm doing exercises at the gym.    Patient Stated Goals reduce LBP, learn exercises to reduce LBP    Currently in Pain? No/denies                             [facility] Adult PT Treatment/Exercise - 11/20/19 0001      Exercises   Exercises Shoulder      Lumbar Exercises: Stretches   Active Hamstring Stretch Left;Right;2 reps;20 seconds    Active Hamstring Stretch Limitations with strap    Piriformis Stretch Left;Right;3 reps;20 seconds    Piriformis Stretch Limitations supine      Lumbar Exercises: Standing   Other Standing Lumbar Exercises Pallof press: green 2x10      Shoulder Exercises: Supine   Horizontal ABduction Strengthening;Both;20 reps    Theraband Level (Shoulder Horizontal ABduction) Level 3 (Green)     Horizontal ABduction Limitations 1x10 on mat, 1x10 on foam roll    External Rotation Strengthening;Both;20 reps    Theraband Level (Shoulder External Rotation) Level 3 (Green)    External Rotation Limitations 1x10 on mat, 1x10 on foam roll    Diagonals Strengthening;Both;20 reps    Theraband Level (Shoulder Diagonals) Level 3 (Green)    Diagonals Limitations 1x10 on mat and 1x10 on foam roll                  PT Education - 11/20/19 0900    Education Details Access Code: [email or web address]    Person(s) Educated Patient    Methods Explanation;Demonstration;Handout    Comprehension Verbalized understanding;Returned demonstration            PT Short Term Goals - 11/13/19 0900      PT SHORT TERM GOAL #1   Title be independent in initial HEP    Time 4    Period Weeks    Status New    Target Date 12/11/19      PT SHORT TERM GOAL #2   Title report a 30% reduction in LBP with ADLs and self-care    Time 4    Period Weeks  Status New    Target Date 12/11/19      PT SHORT TERM GOAL #3   Title demonstrate and verbalize correct body mechanics modifications and report corrections throughout the day    Time 4    Period Weeks    Status New    Target Date 12/11/19             PT Long Term Goals - 11/13/19 0949      PT LONG TERM GOAL #1   Title be independent in advanced HEP    Time 8    Period Weeks    Status New    Target Date 01/08/20      PT LONG TERM GOAL #2   Title reduce FOTO to < or = to 29% limitation    Time 8    Period Weeks    Status New    Target Date 01/08/20      PT LONG TERM GOAL #3   Title report a 60% reduction in LBP with self-care and ADLs    Time 8    Period Weeks    Status New      PT LONG TERM GOAL #4   Title verbalize body mechanics modifications and safe progression of exercise for lumbar protection    Time 8    Period Weeks    Status New    Target Date 01/08/20                 Plan - 11/20/19 0911    Clinical Impression  Statement Pt is making steady progress with PT.  Pt did well with dry needling last session and reports improved flexibility after.  Session focused on building HEP for postural and core strength.  Pt performed supine theraband on flat surface and on a foam roll.  Pt was challenged with exercise on the foam roll.  Pt demonstrates good abdominal contraction and requires intermittent cueing for engagement.  Pt will continue to benefit from skilled PT to build HEP and for manual therapy to address lumbar and gluteal tension.    Rehab Potential Excellent    PT Frequency 2x / week    PT Duration 8 weeks    PT Treatment/Interventions ADLs/Self Care Home Management;Cryotherapy;Electrical Stimulation;Moist Heat;Neuromuscular re-education;Therapeutic exercise;Therapeutic activities;Functional mobility training;Patient/family education;Manual techniques;Passive range of motion;Taping;Dry needling;Joint Manipulations;Spinal Manipulations    PT Next Visit Plan review new HEP, dry needling to lumbar and gluteals, body mechanics education    PT Home Exercise Plan Access Code: [email or web address]    Consulted and Agree with Plan of Care Patient           Patient will benefit from skilled therapeutic intervention in order to improve the following deficits and impairments:  Decreased activity tolerance, Decreased strength, Postural dysfunction, Improper body mechanics, Impaired flexibility, Pain, Increased muscle spasms, Difficulty walking  Visit Diagnosis: Chronic bilateral low back pain without sciatica  Muscle weakness (generalized)     Problem List Patient Active Problem List   Diagnosis Date Noted  . Seasonal and perennial allergic rhinitis 07/18/2016  . Intertrigo 03/21/2016  . Right arm numbness 12/31/2015  . Need for prophylactic vaccination against Streptococcus pneumoniae (pneumococcus) 12/31/2015  . Neoplasm of uncertain behavior of skin 01/26/2015  . OSA on CPAP 01/25/2015  . CAD (coronary artery  disease) 01/25/2015  . Well adult exam 05/20/2012  . Pruritus ani 02/13/2012  . Stress 02/13/2012  . Wound, open, forearm 01/26/2011  . Fall against object 01/25/2011  . Degloving injury 01/25/2011  .  Flank pain, acute 01/25/2011  . Knee pain, acute 01/25/2011  . Concussion 01/25/2011  . Vertigo 12/13/2010  . INSOMNIA, PERSISTENT 11/23/2008  . ALLERGY, FOOD 11/23/2008  . GERD 04/16/2007  . ERECTILE DYSFUNCTION 04/16/2007  . Dyslipidemia 01/31/2007    Sigurd Sos, PT 11/20/19 9:23 AM PHYSICAL THERAPY DISCHARGE SUMMARY  Visits from Start of Care: 3 Current functional level related to goals / functional outcomes: Pt was hospitalized with heart attack.     Remaining deficits: See above for most current PT status.     Education / Equipment: HEP, posture/mechanics Plan: Patient agrees to discharge.  Patient goals were not met. Patient is being discharged due to a change in medical status.  ?????        Sigurd Sos, PT 01/14/20 11:44 AM  [facility] [address] [place], [place], [address] Phone: [phone]   Fax:  [phone]  Name: Alan Wall MRN: [id number] Date of Birth: [birth year]

## 2019-12-01 ENCOUNTER — Inpatient Hospital Stay (HOSPITAL_COMMUNITY)
Admission: EM | Admit: 2019-12-01 | Discharge: 2019-12-04 | DRG: 251 | Disposition: A | Discharge status: Home / Self Care | Payer: Medicare HMO | Attending: Cardiology | Admitting: Cardiology

## 2019-12-01 ENCOUNTER — Inpatient Hospital Stay (HOSPITAL_COMMUNITY)
Admission: EM | Disposition: A | Discharge status: Home / Self Care | Payer: Self-pay | Source: Home / Self Care | Attending: Cardiology

## 2019-12-01 ENCOUNTER — Encounter (HOSPITAL_COMMUNITY): Payer: Self-pay

## 2019-12-01 DIAGNOSIS — I2541 Coronary artery aneurysm: Secondary | ICD-10-CM | POA: Diagnosis present

## 2019-12-01 DIAGNOSIS — I2119 ST elevation (STEMI) myocardial infarction involving other coronary artery of inferior wall: Principal | ICD-10-CM

## 2019-12-01 DIAGNOSIS — R030 Elevated blood-pressure reading, without diagnosis of hypertension: Secondary | ICD-10-CM | POA: Diagnosis present

## 2019-12-01 DIAGNOSIS — I2510 Atherosclerotic heart disease of native coronary artery without angina pectoris: Secondary | ICD-10-CM | POA: Diagnosis present

## 2019-12-01 DIAGNOSIS — R079 Chest pain, unspecified: Secondary | ICD-10-CM | POA: Diagnosis not present

## 2019-12-01 DIAGNOSIS — R001 Bradycardia, unspecified: Secondary | ICD-10-CM | POA: Diagnosis not present

## 2019-12-01 DIAGNOSIS — E785 Hyperlipidemia, unspecified: Secondary | ICD-10-CM | POA: Diagnosis present

## 2019-12-01 DIAGNOSIS — Z8042 Family history of malignant neoplasm of prostate: Secondary | ICD-10-CM

## 2019-12-01 DIAGNOSIS — Z9989 Dependence on other enabling machines and devices: Secondary | ICD-10-CM | POA: Diagnosis not present

## 2019-12-01 DIAGNOSIS — Z8719 Personal history of other diseases of the digestive system: Secondary | ICD-10-CM | POA: Diagnosis not present

## 2019-12-01 DIAGNOSIS — I2111 ST elevation (STEMI) myocardial infarction involving right coronary artery: Secondary | ICD-10-CM | POA: Diagnosis present

## 2019-12-01 DIAGNOSIS — G4733 Obstructive sleep apnea (adult) (pediatric): Secondary | ICD-10-CM | POA: Diagnosis present

## 2019-12-01 DIAGNOSIS — Z79899 Other long term (current) drug therapy: Secondary | ICD-10-CM

## 2019-12-01 DIAGNOSIS — E7800 Pure hypercholesterolemia, unspecified: Secondary | ICD-10-CM

## 2019-12-01 DIAGNOSIS — Z20822 Contact with and (suspected) exposure to covid-19: Secondary | ICD-10-CM | POA: Diagnosis present

## 2019-12-01 DIAGNOSIS — I4729 Other ventricular tachycardia: Secondary | ICD-10-CM

## 2019-12-01 DIAGNOSIS — Z8249 Family history of ischemic heart disease and other diseases of the circulatory system: Secondary | ICD-10-CM | POA: Diagnosis not present

## 2019-12-01 DIAGNOSIS — I472 Ventricular tachycardia: Secondary | ICD-10-CM | POA: Diagnosis not present

## 2019-12-01 HISTORY — DX: Atherosclerotic heart disease of native coronary artery without angina pectoris: I25.10

## 2019-12-01 HISTORY — PX: CORONARY/GRAFT ACUTE MI REVASCULARIZATION: CATH118305

## 2019-12-01 HISTORY — PX: LEFT HEART CATH AND CORONARY ANGIOGRAPHY: CATH118249

## 2019-12-01 HISTORY — DX: Sleep apnea, unspecified: G47.30

## 2019-12-01 HISTORY — DX: Acute myocardial infarction, unspecified: I21.9

## 2019-12-01 LAB — CBC WITH DIFFERENTIAL/PLATELET
Abs Immature Granulocytes: 0.05 10*3/uL (ref 0.00–0.07)
Basophils Absolute: 0.1 10*3/uL (ref 0.0–0.1)
Basophils Relative: 1 %
Eosinophils Absolute: 0.1 10*3/uL (ref 0.0–0.5)
Eosinophils Relative: 1 %
HCT: 46.7 % (ref 39.0–52.0)
Hemoglobin: 15.2 g/dL (ref 13.0–17.0)
Immature Granulocytes: 1 %
Lymphocytes Relative: 15 %
Lymphs Abs: 1.5 10*3/uL (ref 0.7–4.0)
MCH: 32.9 pg (ref 26.0–34.0)
MCHC: 32.5 g/dL (ref 30.0–36.0)
MCV: 101.1 fL — ABNORMAL HIGH (ref 80.0–100.0)
Monocytes Absolute: 0.7 10*3/uL (ref 0.1–1.0)
Monocytes Relative: 7 %
Neutro Abs: 7.8 10*3/uL — ABNORMAL HIGH (ref 1.7–7.7)
Neutrophils Relative %: 75 %
Platelets: 206 10*3/uL (ref 150–400)
RBC: 4.62 MIL/uL (ref 4.22–5.81)
RDW: 12 % (ref 11.5–15.5)
WBC: 10.1 10*3/uL (ref 4.0–10.5)
nRBC: 0 % (ref 0.0–0.2)

## 2019-12-01 LAB — COMPREHENSIVE METABOLIC PANEL
ALT: 26 U/L (ref 0–44)
AST: 33 U/L (ref 15–41)
Albumin: 4.5 g/dL (ref 3.5–5.0)
Alkaline Phosphatase: 52 U/L (ref 38–126)
Anion gap: 11 (ref 5–15)
BUN: 17 mg/dL (ref 8–23)
CO2: 27 mmol/L (ref 22–32)
Calcium: 9.3 mg/dL (ref 8.9–10.3)
Chloride: 104 mmol/L (ref 98–111)
Creatinine, Ser: 1.05 mg/dL (ref 0.61–1.24)
GFR calc Af Amer: >60 mL/min (ref >60)
GFR calc non Af Amer: >60 mL/min (ref >60)
Glucose, Bld: 106 mg/dL — ABNORMAL HIGH (ref 70–99)
Potassium: 4.3 mmol/L (ref 3.5–5.1)
Sodium: 142 mmol/L (ref 135–145)
Total Bilirubin: 0.8 mg/dL (ref 0.3–1.2)
Total Protein: 6.9 g/dL (ref 6.5–8.1)

## 2019-12-01 LAB — LIPID PANEL
Cholesterol: 179 mg/dL (ref 0–200)
HDL: 60 mg/dL (ref >40)
LDL Cholesterol: 104 mg/dL — ABNORMAL HIGH (ref 0–99)
Total CHOL/HDL Ratio: 3 RATIO
Triglycerides: 76 mg/dL (ref <150)
VLDL: 15 mg/dL (ref 0–40)

## 2019-12-01 LAB — TROPONIN I (HIGH SENSITIVITY)
Troponin I (High Sensitivity): 220 ng/L (ref <18)
Troponin I (High Sensitivity): 411 ng/L (ref <18)

## 2019-12-01 LAB — PROTIME-INR
INR: 1 (ref 0.8–1.2)
Prothrombin Time: 13.1 seconds (ref 11.4–15.2)

## 2019-12-01 LAB — HEMOGLOBIN A1C
Hgb A1c MFr Bld: 5.9 % — ABNORMAL HIGH (ref 4.8–5.6)
Mean Plasma Glucose: 122.63 mg/dL

## 2019-12-01 LAB — SARS CORONAVIRUS 2 BY RT PCR (HOSPITAL ORDER, PERFORMED IN ~~LOC~~ HOSPITAL LAB): SARS Coronavirus 2: NEGATIVE

## 2019-12-01 LAB — POCT ACTIVATED CLOTTING TIME
Activated Clotting Time: 252 seconds
Activated Clotting Time: 313 seconds

## 2019-12-01 LAB — APTT: aPTT: 25 seconds (ref 24–36)

## 2019-12-01 LAB — MRSA PCR SCREENING: MRSA by PCR: NEGATIVE

## 2019-12-01 SURGERY — LEFT HEART CATH AND CORONARY ANGIOGRAPHY
Anesthesia: LOCAL

## 2019-12-01 MED ORDER — HEPARIN SODIUM (PORCINE) 1000 UNIT/ML IJ SOLN
INTRAMUSCULAR | Status: AC
  Filled 2019-12-01: qty 1

## 2019-12-01 MED ORDER — ASPIRIN 81 MG PO CHEW
324.0000 mg | CHEWABLE_TABLET | Freq: Once | ORAL | Status: AC
  Administered 2019-12-01: 324 mg via ORAL
  Filled 2019-12-01: qty 4

## 2019-12-01 MED ORDER — VERAPAMIL HCL 2.5 MG/ML IV SOLN
INTRAVENOUS | Status: DC | PRN
  Administered 2019-12-01: 10 mL via INTRA_ARTERIAL

## 2019-12-01 MED ORDER — MELATONIN 5 MG PO TABS
5.0000 mg | ORAL_TABLET | Freq: Every day | ORAL | Status: DC
  Administered 2019-12-01 – 2019-12-03 (×3): 5 mg via ORAL
  Filled 2019-12-01 (×3): qty 1

## 2019-12-01 MED ORDER — VERAPAMIL HCL 2.5 MG/ML IV SOLN
INTRAVENOUS | Status: DC | PRN
  Administered 2019-12-01 (×2): 200 ug via INTRACORONARY

## 2019-12-01 MED ORDER — ONDANSETRON HCL 4 MG/2ML IJ SOLN
4.0000 mg | Freq: Four times a day (QID) | INTRAMUSCULAR | Status: DC | PRN
  Administered 2019-12-01 – 2019-12-02 (×2): 4 mg via INTRAVENOUS
  Filled 2019-12-01 (×2): qty 2

## 2019-12-01 MED ORDER — IOHEXOL 350 MG/ML SOLN
INTRAVENOUS | Status: AC
  Filled 2019-12-01: qty 1

## 2019-12-01 MED ORDER — TICAGRELOR 90 MG PO TABS
ORAL_TABLET | ORAL | Status: AC
  Filled 2019-12-01: qty 2

## 2019-12-01 MED ORDER — SODIUM CHLORIDE 0.9 % IV SOLN: 250.0000 mL | INTRAVENOUS | Status: DC | PRN

## 2019-12-01 MED ORDER — SODIUM CHLORIDE 0.9% FLUSH
3.0000 mL | Freq: Two times a day (BID) | INTRAVENOUS | Status: DC
  Administered 2019-12-02 – 2019-12-03 (×4): 3 mL via INTRAVENOUS

## 2019-12-01 MED ORDER — SODIUM CHLORIDE 0.9 % IV SOLN: INTRAVENOUS | Status: DC

## 2019-12-01 MED ORDER — LIDOCAINE HCL (PF) 1 % IJ SOLN
INTRAMUSCULAR | Status: DC | PRN
  Administered 2019-12-01: 2 mL via SUBCUTANEOUS

## 2019-12-01 MED ORDER — FENTANYL CITRATE (PF) 100 MCG/2ML IJ SOLN
INTRAMUSCULAR | Status: DC | PRN
  Administered 2019-12-01: 25 ug via INTRAVENOUS

## 2019-12-01 MED ORDER — TIROFIBAN (AGGRASTAT) BOLUS VIA INFUSION
INTRAVENOUS | Status: DC | PRN
  Administered 2019-12-01: 1870 ug via INTRAVENOUS

## 2019-12-01 MED ORDER — TICAGRELOR 90 MG PO TABS
90.0000 mg | ORAL_TABLET | Freq: Two times a day (BID) | ORAL | Status: DC
  Administered 2019-12-02 – 2019-12-04 (×5): 90 mg via ORAL
  Filled 2019-12-01 (×5): qty 1

## 2019-12-01 MED ORDER — ACETAMINOPHEN 325 MG PO TABS
650.0000 mg | ORAL_TABLET | ORAL | Status: DC | PRN
  Administered 2019-12-02 (×2): 650 mg via ORAL
  Filled 2019-12-01 (×2): qty 2

## 2019-12-01 MED ORDER — HYDRALAZINE HCL 20 MG/ML IJ SOLN: 10.0000 mg | INTRAMUSCULAR | Status: DC | PRN

## 2019-12-01 MED ORDER — SODIUM CHLORIDE 0.9% FLUSH: 3.0000 mL | INTRAVENOUS | Status: DC | PRN

## 2019-12-01 MED ORDER — HEPARIN SODIUM (PORCINE) 1000 UNIT/ML IJ SOLN
INTRAMUSCULAR | Status: DC | PRN
  Administered 2019-12-01: 3000 [IU] via INTRAVENOUS
  Administered 2019-12-01: 8000 [IU] via INTRAVENOUS

## 2019-12-01 MED ORDER — IOHEXOL 350 MG/ML SOLN
INTRAVENOUS | Status: DC | PRN
  Administered 2019-12-01: 115 mL via INTRA_ARTERIAL

## 2019-12-01 MED ORDER — FENTANYL CITRATE (PF) 100 MCG/2ML IJ SOLN
INTRAMUSCULAR | Status: AC
  Filled 2019-12-01: qty 2

## 2019-12-01 MED ORDER — MIDAZOLAM HCL 2 MG/2ML IJ SOLN
INTRAMUSCULAR | Status: AC
  Filled 2019-12-01: qty 2

## 2019-12-01 MED ORDER — SODIUM CHLORIDE 0.9 % WEIGHT BASED INFUSION: 1.0000 mL/kg/h | INTRAVENOUS | Status: DC

## 2019-12-01 MED ORDER — HEPARIN SODIUM (PORCINE) 5000 UNIT/ML IJ SOLN: 4000.0000 [IU] | Freq: Once | INTRAMUSCULAR | Status: DC

## 2019-12-01 MED ORDER — LABETALOL HCL 5 MG/ML IV SOLN: 10.0000 mg | INTRAVENOUS | Status: DC | PRN

## 2019-12-01 MED ORDER — VERAPAMIL HCL 2.5 MG/ML IV SOLN
INTRAVENOUS | Status: AC
  Filled 2019-12-01: qty 2

## 2019-12-01 MED ORDER — NITROGLYCERIN 1 MG/10 ML FOR IR/CATH LAB
INTRA_ARTERIAL | Status: AC
  Filled 2019-12-01: qty 10

## 2019-12-01 MED ORDER — MIDAZOLAM HCL 2 MG/2ML IJ SOLN
INTRAMUSCULAR | Status: DC | PRN
  Administered 2019-12-01: 2 mg via INTRAVENOUS

## 2019-12-01 MED ORDER — ZOLPIDEM TARTRATE 5 MG PO TABS: 5.0000 mg | ORAL_TABLET | Freq: Every evening | ORAL | Status: DC | PRN

## 2019-12-01 MED ORDER — LIDOCAINE HCL (PF) 1 % IJ SOLN
INTRAMUSCULAR | Status: AC
  Filled 2019-12-01: qty 30

## 2019-12-01 MED ORDER — ASPIRIN 81 MG PO CHEW
81.0000 mg | CHEWABLE_TABLET | Freq: Every day | ORAL | Status: DC
  Administered 2019-12-02 – 2019-12-04 (×3): 81 mg via ORAL
  Filled 2019-12-01 (×4): qty 1

## 2019-12-01 MED ORDER — TIROFIBAN HCL IV 12.5 MG/250 ML
0.0750 ug/kg/min | INTRAVENOUS | Status: AC
  Administered 2019-12-01: 0.075 ug/kg/min via INTRAVENOUS
  Filled 2019-12-01: qty 250

## 2019-12-01 MED ORDER — TIROFIBAN HCL IN NACL 5-0.9 MG/100ML-% IV SOLN
INTRAVENOUS | Status: AC
  Filled 2019-12-01: qty 100

## 2019-12-01 MED ORDER — HEPARIN (PORCINE) IN NACL 1000-0.9 UT/500ML-% IV SOLN
INTRAVENOUS | Status: DC | PRN
  Administered 2019-12-01 (×2): 500 mL

## 2019-12-01 MED ORDER — HEPARIN (PORCINE) IN NACL 1000-0.9 UT/500ML-% IV SOLN
INTRAVENOUS | Status: AC
  Filled 2019-12-01: qty 1000

## 2019-12-01 MED ORDER — TIROFIBAN HCL IN NACL 5-0.9 MG/100ML-% IV SOLN
INTRAVENOUS | Status: AC | PRN
  Administered 2019-12-01: 0.075 ug/kg/min via INTRAVENOUS

## 2019-12-01 MED ORDER — TICAGRELOR 90 MG PO TABS
ORAL_TABLET | ORAL | Status: DC | PRN
  Administered 2019-12-01: 180 mg via ORAL

## 2019-12-01 MED ORDER — LORATADINE 10 MG PO TABS
10.0000 mg | ORAL_TABLET | Freq: Every day | ORAL | Status: DC
  Administered 2019-12-02 – 2019-12-03 (×2): 10 mg via ORAL
  Filled 2019-12-01 (×4): qty 1

## 2019-12-01 SURGICAL SUPPLY — 16 items
BALLN SAPPHIRE 2.0X15 (BALLOONS) ×2
BALLOON SAPPHIRE 2.0X15 (BALLOONS) IMPLANT
CATH 5FR JL3.5 JR4 ANG PIG MP (CATHETERS) ×1 IMPLANT
CATH LAUNCHER 6FR JR4 (CATHETERS) ×1 IMPLANT
DEVICE RAD COMP TR BAND LRG (VASCULAR PRODUCTS) ×2 IMPLANT
ELECT DEFIB PAD ADLT CADENCE (PAD) ×1 IMPLANT
GLIDESHEATH SLEND SS 6F .021 (SHEATH) ×1 IMPLANT
GUIDEWIRE INQWIRE 1.5J.035X260 (WIRE) IMPLANT
INQWIRE 1.5J .035X260CM (WIRE) ×2
KIT ENCORE 26 ADVANTAGE (KITS) ×1 IMPLANT
KIT HEART LEFT (KITS) ×2 IMPLANT
PACK CARDIAC CATHETERIZATION (CUSTOM PROCEDURE TRAY) ×2 IMPLANT
TRANSDUCER W/STOPCOCK (MISCELLANEOUS) ×2 IMPLANT
TUBING CIL FLEX 10 FLL-RA (TUBING) ×2 IMPLANT
WIRE COUGAR XT STRL 190CM (WIRE) ×1 IMPLANT
WIRE HI TORQ WHISPER MS 190CM (WIRE) ×1 IMPLANT

## 2019-12-01 NOTE — Progress Notes (Signed)
Patient refuses CPAP for the night. Will let RN know if he changes his mind.

## 2019-12-01 NOTE — ED Provider Notes (Signed)
[facility] DEPT Provider Note   CSN: [id number] Arrival date & time: 12/01/19  1529     History Chief Complaint  Patient presents with  . Chest Pain    Alan Wall is a 74 y.o. male.  Patient states that around 215 he started having chest discomfort. He also had some nausea and numbness down his right arm.  The history is provided by the patient. No language interpreter was used.  Chest Pain Pain location:  L chest Pain quality: aching   Radiates to: Right arm. Pain severity:  Mild Onset quality:  Sudden Timing:  Intermittent Progression:  Waxing and waning Chronicity:  New Context: not breathing   Relieved by:  Nothing Associated symptoms: no abdominal pain, no back pain, no cough, no fatigue and no headache        Past Medical History:  Diagnosis Date  . Allergy    Food/cat dander/bee stings  . Hematoma of leg   . Hyperlipidemia   . S/P dilatation of esophageal stricture   . Sleep apnea     Patient Active Problem List   Diagnosis Date Noted  . Seasonal and perennial allergic rhinitis 07/18/2016  . Intertrigo 03/21/2016  . Right arm numbness 12/31/2015  . Need for prophylactic vaccination against Streptococcus pneumoniae (pneumococcus) 12/31/2015  . Neoplasm of uncertain behavior of skin 01/26/2015  . OSA on CPAP 01/25/2015  . CAD (coronary artery disease) 01/25/2015  . Well adult exam 05/20/2012  . Pruritus ani 02/13/2012  . Stress 02/13/2012  . Wound, open, forearm 01/26/2011  . Fall against object 01/25/2011  . Degloving injury 01/25/2011  . Flank pain, acute 01/25/2011  . Knee pain, acute 01/25/2011  . Concussion 01/25/2011  . Vertigo 12/13/2010  . INSOMNIA, PERSISTENT 11/23/2008  . ALLERGY, FOOD 11/23/2008  . GERD 04/16/2007  . ERECTILE DYSFUNCTION 04/16/2007  . Dyslipidemia 01/31/2007    Past Surgical History:  Procedure Laterality Date  . CATARACT EXTRACTION W/ INTRAOCULAR LENS  IMPLANT, BILATERAL  2010         Family History  Problem Relation Age of Onset  . Heart disease Father        CABG at 108 yo  . Heart disease Brother   . Prostate cancer Paternal Uncle   . Coronary artery disease Other     Social History   Tobacco Use  . Smoking status: Never Smoker  . Smokeless tobacco: Never Used  Substance Use Topics  . Alcohol use: Yes    Alcohol/week: 2.0 standard drinks    Types: 2 Glasses of wine per week  . Drug use: No    Home Medications Prior to Admission medications   Medication Sig Start Date End Date Taking? Authorizing Provider  Chlorpheniramine-PSE-Ibuprofen (ADVIL ALLERGY SINUS) 2-30-200 MG TABS 1-2 tabs PO Q4-6 hrs PRN 06/28/14   Baker, Freeman Caldron, PA-C  cholecalciferol (VITAMIN D) 400 units TABS tablet Take 400 Units by mouth daily.    [provider]  Coenzyme Q10 100 MG capsule Take by mouth.    [provider]  fluticasone Asencion Islam) 50 MCG/ACT nasal spray  01/13/15   [provider]  ketoconazole (NIZORAL) 2 % cream Apply 1 application topically 2 (two) times daily. 04/23/19   [provider]  Loratadine 10 MG CAPS Take by mouth.    [provider]  melatonin 5 MG TABS melatonin 5 mg tablet  Take 1 tablet every day by oral route.    [provider]  nystatin cream (MYCOSTATIN)  Apply topically 2 (two) times daily. 06/09/12   Harden Mo, MD  pravastatin (PRAVACHOL) 40 MG tablet TAKE 1 TABLET BY MOUTH ONCE DAILY 04/12/17   Plotnikov, Evie Lacks, MD  zaleplon (SONATA) 5 MG capsule TAKE 1 CAPSULE BY MOUTH NIGHTLY AS NEEDED 09/19/19   [provider]  zolpidem (AMBIEN) 5 MG tablet zolpidem 5 mg tablet  take 1 tab once per week    [provider]    Allergies    Bee venom, Sulfa antibiotics, Dog epithelium allergy skin test, and Other  Review of Systems   Review of Systems  Constitutional: Negative for appetite change and fatigue.  HENT: Negative for congestion, ear discharge and sinus  pressure.   Eyes: Negative for discharge.  Respiratory: Negative for cough.   Cardiovascular: Positive for chest pain.  Gastrointestinal: Negative for abdominal pain and diarrhea.  Genitourinary: Negative for frequency and hematuria.  Musculoskeletal: Negative for back pain.  Skin: Negative for rash.  Neurological: Negative for seizures and headaches.  Psychiatric/Behavioral: Negative for hallucinations.    Physical Exam Updated Vital Signs BP (!) 139/100   Pulse 67   Temp 97.8 F (36.6 C) (Oral)   Resp 16   Ht 5\' 7"  (1.702 m)   Wt 74.8 kg   SpO2 100%   BMI 25.84 kg/m   Physical Exam Vitals and nursing note reviewed.  Constitutional:      Appearance: He is well-developed.  HENT:     Head: Normocephalic.     Nose: Nose normal.  Eyes:     General: No scleral icterus.    Conjunctiva/sclera: Conjunctivae normal.  Neck:     Thyroid: No thyromegaly.  Cardiovascular:     Rate and Rhythm: Normal rate and regular rhythm.     Heart sounds: No murmur heard.  No friction rub. No gallop.   Pulmonary:     Breath sounds: No stridor. No wheezing or rales.  Chest:     Chest wall: No tenderness.  Abdominal:     General: There is no distension.     Tenderness: There is no abdominal tenderness. There is no rebound.  Musculoskeletal:        General: Normal range of motion.     Cervical back: Neck supple.  Lymphadenopathy:     Cervical: No cervical adenopathy.  Skin:    Findings: No erythema or rash.  Neurological:     Mental Status: He is alert and oriented to person, place, and time.     Motor: No abnormal muscle tone.     Coordination: Coordination normal.  Psychiatric:        Behavior: Behavior normal.     ED Results / Procedures / Treatments   Labs (all labs ordered are listed, but only abnormal results are displayed) Labs Reviewed  HEMOGLOBIN A1C  CBC WITH DIFFERENTIAL/PLATELET  PROTIME-INR  APTT  COMPREHENSIVE METABOLIC PANEL  LIPID PANEL  TROPONIN I (HIGH  SENSITIVITY)    EKG None  Radiology No results found.  Procedures Procedures (including critical care time)  Medications Ordered in ED Medications  0.9 %  sodium chloride infusion (has no administration in time range)  heparin injection 60 Units/kg (has no administration in time range)  aspirin chewable tablet 324 mg (324 mg Oral Given 12/01/19 1557)    ED Course  I have reviewed the triage vital signs and the nursing notes.  Pertinent labs & imaging results that were available during my care of the patient were reviewed by me and considered  in my medical decision making (see chart for details). CRITICAL CARE Performed by: Milton Ferguson Total critical care time: 20 minutes Critical care time was exclusive of separately billable procedures and treating other patients. Critical care was necessary to treat or prevent imminent or life-threatening deterioration. Critical care was time spent personally by me on the following activities: development of treatment plan with patient and/or surrogate as well as nursing, discussions with consultants, evaluation of patient's response to treatment, examination of patient, obtaining history from patient or surrogate, ordering and performing treatments and interventions, ordering and review of laboratory studies, ordering and review of radiographic studies, pulse oximetry and re-evaluation of patient's condition.    MDM Rules/Calculators/A&P                         EKG shows inferior MI. Patient is being immediately transferred over to [facility] for STEMI         This patient presents to the ED for concern of chest pain, this involves an extensive number of treatment options, and is a complaint that carries with it a high risk of complications and morbidity.  The differential diagnosis includes MI   Lab Tests:   I Ordered, reviewed, and interpreted labs, which included troponin and CBC still pending  Medicines ordered:   I  ordered medication heparin and aspirin  Imaging Studies ordered:   Additional history obtained:   Additional history obtained from records  Previous records obtained and reviewed.  Consultations Obtained:   I consulted cardiology and discussed lab and imaging findings  Reevaluation:  After the interventions stated above, I reevaluated the patient and found improved  Critical Interventions:  .   Final Clinical Impression(s) / ED Diagnoses Final diagnoses:  Inferior MI [facility])    Rx / DC Orders ED Discharge Orders    None       Milton Ferguson, MD 12/01/19 1609

## 2019-12-01 NOTE — Progress Notes (Signed)
ANTICOAGULATION CONSULT NOTE - Initial Consult  Pharmacy Consult for tirofiban Indication: chest pain/ACS  Allergies  Allergen Reactions  . Bee Venom   . Sulfa Antibiotics Itching    Cats  . Dog Epithelium Allergy Skin Test   . Other     Patient Measurements: Height: 5\' 7"  (170.2 cm) Weight: 74.8 kg (165 lb) IBW/kg (Calculated) : 66.1  Vital Signs: Temp: 97.8 F (36.6 C) (08/09 1551) Temp Source: Oral (08/09 1551) BP: 124/87 (08/09 1830) Pulse Rate: 63 (08/09 1830)  Labs: Recent Labs    12/01/19 1555  HGB 15.2  HCT 46.7  PLT 206  APTT 25  LABPROT 13.1  INR 1.0  CREATININE 1.05  TROPONINIHS 220*    Estimated Creatinine Clearance: 58.6 mL/min (by C-G formula based on SCr of 1.05 mg/dL).   Medical History: Past Medical History:  Diagnosis Date  . Allergy    Food/cat dander/bee stings  . Hematoma of leg   . Hyperlipidemia   . S/P dilatation of esophageal stricture   . Sleep apnea     Assessment: 74 year old male admitted as stemi now s/p cath. Unable to PCI his RCA. DAPT to start post cath along with aggrastat x 18 hours.   CBC within normal limits. Crcl just below 48ml/min so will use reduced dose tirofiban.   Goal of Therapy:  Monitor platelets by anticoagulation protocol: Yes   Plan:  Tirofiban 0.016mcg/kg/min x 18 hours CBC in am  Erin Hearing PharmD., BCPS Clinical Pharmacist 12/01/2019 6:58 PM

## 2019-12-01 NOTE — ED Triage Notes (Signed)
Pt arrived today complaining of chest pain N/V. EKG in triage showed Stemi, provider informed.

## 2019-12-01 NOTE — ED Notes (Signed)
[facility] at bedside 

## 2019-12-01 NOTE — H&P (Addendum)
Cardiology Admission History and Physical:   Patient ID: Alan Wall MRN: [id number]; DOB: 04/09/1946   Admission date: 12/01/2019  Primary Care Provider: Barbra Sarks, MD [facility] Cardiologist: No primary care provider on file. - New to Dr. Burt Knack [facility] Electrophysiologist:  None   Chief Complaint:  Chest pain  Patient Profile:   Alan Wall is a 74 y.o. male with OSA, HLD, dilation of esophageal stricture, normal stress test in 2019 who presented to [facility] upon transfer from [facility] ED with chest pain.  History of Present Illness:   Alan Wall denies any formal cardiac history except does have a history of elevated calcium score of 573 by screening in 03/2017. He also has a family history of CAD in his father in his 88s. He was in his USOH today sitting at a computer when he developed chest pain with nausea and numbness down his right arm around 2:15pm. He contacted his primary provider at [facility] and was recommended to seek care in the emergency room. He arrived to the ED where in triage his EKG demonstrated concern for inferior STEMI. Code STEMI was activated and he was transferred to the [facility] cath lab for further management. He received 324mg  ASA and initiation of heparin en route. Upon arrival he is pain free and in good spirits. He denies any tobacco or drug use. Drinks 2 drinks per week. Denies any hx of TIA, CVA, bleeding, recent fevers, chills or URI symptoms. Covid swab and labs pending. Reports hx of environmental allergies to dander but denies medication allergies.   Past Medical History:  Diagnosis Date  . Allergy    Food/cat dander/bee stings  . Hematoma of leg   . Hyperlipidemia   . S/P dilatation of esophageal stricture   . Sleep apnea     Past Surgical History:  Procedure Laterality Date  . CATARACT EXTRACTION W/ INTRAOCULAR LENS  IMPLANT, BILATERAL  2010  . INGUINAL HERNIA REPAIR Left       Medications Prior to Admission: Prior to Admission medications   Medication Sig Start Date End Date Taking? Authorizing Provider  Chlorpheniramine-PSE-Ibuprofen (ADVIL ALLERGY SINUS) 2-30-200 MG TABS 1-2 tabs PO Q4-6 hrs PRN 06/28/14   Baker, Freeman Caldron, PA-C  cholecalciferol (VITAMIN D) 400 units TABS tablet Take 400 Units by mouth daily.    [provider]  Coenzyme Q10 100 MG capsule Take by mouth.    [provider]  fluticasone Asencion Islam) 50 MCG/ACT nasal spray  01/13/15   [provider]  ketoconazole (NIZORAL) 2 % cream Apply 1 application topically 2 (two) times daily. 04/23/19   [provider]  Loratadine 10 MG CAPS Take by mouth.    [provider]  melatonin 5 MG TABS melatonin 5 mg tablet  Take 1 tablet every day by oral route.    [provider]  nystatin cream (MYCOSTATIN) Apply topically 2 (two) times daily. 06/09/12   Harden Mo, MD  pravastatin (PRAVACHOL) 40 MG tablet TAKE 1 TABLET BY MOUTH ONCE DAILY 04/12/17   Plotnikov, Evie Lacks, MD  zaleplon (SONATA) 5 MG capsule TAKE 1 CAPSULE BY MOUTH NIGHTLY AS NEEDED 09/19/19   [provider]  zolpidem (AMBIEN) 5 MG tablet zolpidem 5 mg tablet  take 1 tab once per week    [provider]     Allergies:    Allergies  Allergen Reactions  . Bee Venom   . Sulfa Antibiotics Itching  Cats  . Dog Epithelium Allergy Skin Test   . Other     Social History:   Social History   Socioeconomic History  . Marital status: Married    Spouse name: Not on file  . Number of children: 0  . Years of education: Not on file  . Highest education level: Not on file  Occupational History    Employer: [employer]  Tobacco Use  . Smoking status: Never Smoker  . Smokeless tobacco: Never Used  Substance and Sexual Activity  . Alcohol use: Yes    Alcohol/week: 2.0 standard drinks    Types: 2 Glasses of wine per week    Comment: 2 drinks per week  . Drug use:  No  . Sexual activity: Yes  Other Topics Concern  . Not on file  Social History Narrative   Regular Exercise- yes, bicycle   Social Determinants of Health   Financial Resource Strain:   . Difficulty of Paying Living Expenses:   Food Insecurity:   . Worried About Charity fundraiser in the Last Year:   . Arboriculturist in the Last Year:   Transportation Needs:   . Film/video editor (Medical):   Marland Kitchen Lack of Transportation (Non-Medical):   Physical Activity:   . Days of Exercise per Week:   . Minutes of Exercise per Session:   Stress:   . Feeling of Stress :   Social Connections:   . Frequency of Communication with Friends and Family:   . Frequency of Social Gatherings with Friends and Family:   . Attends Religious Services:   . Active Member of Clubs or Organizations:   . Attends Archivist Meetings:   Marland Kitchen Marital Status:   Intimate Partner Violence:   . Fear of Current or Ex-Partner:   . Emotionally Abused:   Marland Kitchen Physically Abused:   . Sexually Abused:     Family History:   The patient's family history includes Coronary artery disease in an other family member; Heart disease in his brother and father; Prostate cancer in his paternal uncle.    ROS:  Please see the history of present illness.  All other ROS reviewed and negative.     Physical Exam/Data:   Vitals:   12/01/19 1546 12/01/19 1551 12/01/19 1555 12/01/19 1639  BP: (!) 150/99 (!) 139/100    Pulse: 66 67    Resp: 17 16    Temp: 98.2 F (36.8 C) 97.8 F (36.6 C)    TempSrc: Oral Oral    SpO2: 100% 100%  100%  Weight:   74.8 kg   Height:   5\' 7"  (1.702 m)    No intake or output data in the 24 hours ending 12/01/19 1727 Last 3 Weights 12/01/2019 10/13/2019 09/05/2018  Weight (lbs) 165 lb 162 lb 6.4 oz 164 lb  Weight (kg) 74.844 kg 73.664 kg 74.39 kg     Body mass index is 25.84 kg/m.  General: Well developed, well nourished adult male in no acute distress. Head: Normocephalic, atraumatic,  sclera non-icteric, no xanthomas, nares are without discharge. Neck: Negative for carotid bruits. JVP not elevated. Lungs: Clear bilaterally to auscultation without wheezes, rales, or rhonchi. Breathing is unlabored. Heart: RRR S1 S2 without murmurs, rubs, or gallops.  Abdomen: Soft, non-tender, non-distended with normoactive bowel sounds. No rebound/guarding. Extremities: No clubbing or cyanosis. No edema. Distal pedal pulses are 2+ and equal bilaterally. Neuro: Alert and oriented X 3. Moves all extremities spontaneously. Psych:  Responds to questions appropriately with a normal affect.   EKG:  The ECG that was done today was personally reviewed and demonstrates NSR 62pm probable LAE, ST elevation inferiorly in II, III, aVF up to 1-1.3mm, V5-V6, ST depression/TWI avL  Relevant CV Studies: 05/2017 NORMAL STRESS TEST. NORMAL RESTING STUDY WITH NO WALL MOTION ABNORMALITIES  AT REST AND PEAK STRESS.  NORMAL LA PRESSURES WITH NORMAL DIASTOLIC FUNCTION  VALVULAR REGURGITATION: MILD MR, MODERATE PR, MILD TR  NO VALVULAR STENOSIS  Maximum workload of 13.40 METs was achieved during exercise.  RESTING HYPERTENSION - APPROPRIATE RESPONSE   Laboratory Data: Pending  Radiology/Studies:  No results found.     TIMI Risk Score for ST  Elevation MI:   The patient's TIMI risk score is 3, which indicates a 4.4% risk of all cause mortality at 30 days.    Assessment and Plan:   1. Acute inferior MI - transported acutely to the cath lab, further recommendations pending cardiac cath findings.   2. Hyperlipidemia - LDL just resulted at 104, on pravastatin at home. Anticipate escalation of statin therapy.  3. OSA on CPAP - will offer CPAP qhs.  4. Elevated BP without hx of HTN - anticipate post-cath management and monitoring.  Severity of Illness: The appropriate patient status for this patient is INPATIENT. Inpatient status is judged to be reasonable and necessary in order to provide the  required intensity of service to ensure the patient's safety. The patient's presenting symptoms, physical exam findings, and initial radiographic and laboratory data in the context of their chronic comorbidities is felt to place them at high risk for further clinical deterioration. Furthermore, it is not anticipated that the patient will be medically stable for discharge from the hospital within 2 midnights of admission. The following factors support the patient status of inpatient.   " The patient's presenting symptoms include chest pain. " The worrisome physical exam findings include patient indicating chest pain. " The initial radiographic and laboratory data are worrisome because of EKG c/w STEMI. " The chronic co-morbidities include hyperlipidemia   * I certify that at the point of admission it is my clinical judgment that the patient will require inpatient hospital care spanning beyond 2 midnights from the point of admission due to high intensity of service, high risk for further deterioration and high frequency of surveillance required.*    For questions or updates, please contact [place] Please consult [email or web address] for contact info under     Signed, Melina Copa, PA-C  Patient seen, examined. Available data reviewed. Agree with findings, assessment, and plan as outlined by Melina Copa, PA-C.  The patient is independently interviewed and examined on arrival in the emergency room ambulance bay.  He has transferred here [facility] in the setting of an acute inferior STEMI.  He is currently chest pain-free but complains of ongoing nausea.  His symptoms started at 2:15 PM this afternoon and his EKG demonstrates persistent inferior ST segment elevation.  He has no history of coronary artery disease but his father had multivessel CABG in his 35s.  The patient is a non-smoker.  On my examination, he is alert, oriented, in no distress.  HEENT is normal, JVP is normal, there are  no carotid bruits, lungs are clear, heart is regular rate and rhythm with no murmur or gallop, abdomen is soft and nontender, extremities have no edema, skin is warm and dry with no rash.  EKG shows normal sinus rhythm with acute inferior STEMI pattern.  We will proceed emergently with cardiac catheterization and possible PCI.  The procedure is explained to the patient.  Emergency implied consent is obtained.  Further plans/disposition pending his cardiac catheterization result.  Sherren Mocha, M.D. 12/01/2019 5:27 PM   Sherren Mocha, MD  12/01/2019 5:27 PM

## 2019-12-02 ENCOUNTER — Ambulatory Visit: Payer: Medicare HMO

## 2019-12-02 ENCOUNTER — Encounter (HOSPITAL_COMMUNITY): Payer: Self-pay | Admitting: Cardiovascular Disease

## 2019-12-02 ENCOUNTER — Inpatient Hospital Stay (HOSPITAL_COMMUNITY): Payer: Medicare HMO

## 2019-12-02 DIAGNOSIS — Z9989 Dependence on other enabling machines and devices: Secondary | ICD-10-CM

## 2019-12-02 DIAGNOSIS — E7800 Pure hypercholesterolemia, unspecified: Secondary | ICD-10-CM

## 2019-12-02 DIAGNOSIS — G4733 Obstructive sleep apnea (adult) (pediatric): Secondary | ICD-10-CM

## 2019-12-02 DIAGNOSIS — R079 Chest pain, unspecified: Secondary | ICD-10-CM

## 2019-12-02 LAB — BASIC METABOLIC PANEL
Anion gap: 12 (ref 5–15)
BUN: 11 mg/dL (ref 8–23)
CO2: 22 mmol/L (ref 22–32)
Calcium: 9 mg/dL (ref 8.9–10.3)
Chloride: 104 mmol/L (ref 98–111)
Creatinine, Ser: 0.88 mg/dL (ref 0.61–1.24)
GFR calc Af Amer: >60 mL/min (ref >60)
GFR calc non Af Amer: >60 mL/min (ref >60)
Glucose, Bld: 120 mg/dL — ABNORMAL HIGH (ref 70–99)
Potassium: 4 mmol/L (ref 3.5–5.1)
Sodium: 138 mmol/L (ref 135–145)

## 2019-12-02 LAB — ECHOCARDIOGRAM COMPLETE
Area-P 1/2: 2.99 cm2
Height: 67 in
S' Lateral: 3 cm
Weight: 2640 oz

## 2019-12-02 LAB — CBC
HCT: 42.1 % (ref 39.0–52.0)
Hemoglobin: 13.8 g/dL (ref 13.0–17.0)
MCH: 32.1 pg (ref 26.0–34.0)
MCHC: 32.8 g/dL (ref 30.0–36.0)
MCV: 97.9 fL (ref 80.0–100.0)
Platelets: 192 10*3/uL (ref 150–400)
RBC: 4.3 MIL/uL (ref 4.22–5.81)
RDW: 11.8 % (ref 11.5–15.5)
WBC: 8.5 10*3/uL (ref 4.0–10.5)
nRBC: 0 % (ref 0.0–0.2)

## 2019-12-02 LAB — MAGNESIUM: Magnesium: 1.8 mg/dL (ref 1.7–2.4)

## 2019-12-02 MED ORDER — ROSUVASTATIN CALCIUM 20 MG PO TABS
40.0000 mg | ORAL_TABLET | Freq: Every day | ORAL | Status: DC
  Administered 2019-12-02 – 2019-12-04 (×3): 40 mg via ORAL
  Filled 2019-12-02 (×3): qty 2

## 2019-12-02 MED ORDER — ISOSORBIDE MONONITRATE ER 30 MG PO TB24
30.0000 mg | ORAL_TABLET | Freq: Every day | ORAL | Status: DC
  Administered 2019-12-02 – 2019-12-04 (×3): 30 mg via ORAL
  Filled 2019-12-02 (×3): qty 1

## 2019-12-02 MED ORDER — CHLORHEXIDINE GLUCONATE CLOTH 2 % EX PADS
6.0000 | MEDICATED_PAD | Freq: Every day | CUTANEOUS | Status: DC
  Administered 2019-12-02: 6 via TOPICAL

## 2019-12-02 MED FILL — Nitroglycerin IV Soln 100 MCG/ML in D5W: INTRA_ARTERIAL | Qty: 10 | Status: AC

## 2019-12-02 NOTE — Care Management (Signed)
Benefit check sent for brilinta. CM will follow for manufacture's coupon if not sent to [facility] pharmacy to fill

## 2019-12-02 NOTE — Progress Notes (Signed)
  Echocardiogram 2D Echocardiogram has been performed.  Alan Wall 12/02/2019, 8:52 AM

## 2019-12-02 NOTE — TOC Benefit Eligibility Note (Signed)
Transition of Care [facility]) Benefit Eligibility Note    Patient Details  Name: JAVONE YBANEZ MRN: [id number] Date of Birth: Aug 10, 1945   Medication/Dose: BRILINTA  90 MG BID  Covered?: Yes  Tier:  (TIER- 4 DRUG)  Prescription Coverage Preferred Pharmacy: [facility] [place] with Person/Company/Phone Number:: REBECCA  @ [employer] # [phone]  Co-Pay: $250.00  Prior Approval: No  Deductible: Unmet  Additional Notes: TICAGRELOR : Crecencio Mc Phone Number: 12/02/2019, 3:53 PM

## 2019-12-02 NOTE — Discharge Instructions (Signed)
Information about your medication: Brilinta (anti-platelet agent)  Generic Name (Brand): ticagrelor (Brilinta), twice daily medication  PURPOSE: You are taking this medication along with aspirin to lower your chance of having a heart attack, stroke, or blood clots in your heart stent. These can be fatal. Brilinta and aspirin help prevent platelets from sticking together and forming a clot that can block an artery or your stent.   Common SIDE EFFECTS you may experience include: bruising or bleeding more easily, shortness of breath  Do not stop taking BRILINTA without talking to the doctor who prescribes it for you. People who are treated with a stent and stop taking Brilinta too soon, have a higher risk of getting a blood clot in the stent, having a heart attack, or dying. If you stop Brilinta because of bleeding, or for other reasons, your risk of a heart attack or stroke may increase.   Avoid taking NSAID agents or anti-inflammatory medications such as ibuprofen, naproxen given increased bleed risk with plavix - can use acetaminophen (Tylenol) if needed for pain.  Tell all of your doctors and dentists that you are taking Brilinta. They should talk to the doctor who prescribed Brilinta for you before you have any surgery or invasive procedure.   Contact your health care provider if you experience: severe or uncontrollable bleeding, pink/red/brown urine, vomiting blood or vomit that looks like "coffee grounds", red or black stools (looks like tar), coughing up blood or blood clots ---------------------------------------------------------------------------------------------------------------------- Information about your medication: Statin (cholesterol-lowering agent)  Generic Name (Brand): atorvastatin (Lipitor), pravastatin (Pravachol), rosuvastatin (Crestor), simvastatin (Zocor)  PURPOSE: You are taking this medication to lower your "bad" cholesterol (LDL) and to prevent heart attacks and  strokes. Statins can also raise your "good" cholesterol (HDL).  Common SIDE EFFECTS you may experience include: muscle pain or weakness (especially in the legs) and upset stomach.  Take your medication exactly as prescribed. Do not eat large amounts of grapefruit or grapefruit juice while taking this medication.  Contact your health care provider if you experience: severe muscle pain that does not improve, dark urine, or yellowing of your skin or eyes. ----------------------------------------------------------------------------------------------------------------------  

## 2019-12-02 NOTE — Progress Notes (Signed)
RT set up and placed patient on CPAP.

## 2019-12-02 NOTE — Progress Notes (Signed)
Patient placed on CPAP. Pt is doing well RT will continue to monitor.

## 2019-12-02 NOTE — Progress Notes (Signed)
Progress Note  Patient Name: Alan Wall Date of Encounter: 12/02/2019  [facility] Cardiologist: No primary care provider on file. New Dr Burt Knack  Subjective   Still has low grade chest pain. Much better than before.   Inpatient Medications    Scheduled Meds: . aspirin  81 mg Oral Daily  . Chlorhexidine Gluconate Cloth  6 each Topical Daily  . loratadine  10 mg Oral Daily  . melatonin  5 mg Oral QHS  . sodium chloride flush  3 mL Intravenous Q12H  . ticagrelor  90 mg Oral Q12H   Continuous Infusions: . sodium chloride    . sodium chloride    . tirofiban 0.075 mcg/kg/min (12/01/19 2355)   PRN Meds: sodium chloride, acetaminophen, ondansetron (ZOFRAN) IV, sodium chloride flush, zolpidem   Vital Signs    Vitals:   12/02/19 0317 12/02/19 0400 12/02/19 0500 12/02/19 0600  BP:  133/86 (!) 144/85 (!) 138/91  Pulse: 64 (!) 54 (!) 52 (!) 52  Resp: 14 12 13 18   Temp:  97.8 F (36.6 C)    TempSrc:  Axillary    SpO2: 98% 99% 99% 96%  Weight:      Height:        Intake/Output Summary (Last 24 hours) at 12/02/2019 0834 Last data filed at 12/01/2019 2300 Gross per 24 hour  Intake --  Output 800 ml  Net -800 ml   Last 3 Weights 12/01/2019 10/13/2019 09/05/2018  Weight (lbs) 165 lb 162 lb 6.4 oz 164 lb  Weight (kg) 74.844 kg 73.664 kg 74.39 kg      Telemetry    Sinus brady. Occ PVC - Personally Reviewed  ECG    Sinus brady 54. Inferior Q waves. Improved ST elevation - Personally Reviewed  Physical Exam   GEN: No acute distress.   Neck: No JVD Cardiac: RRR, no murmurs, rubs, or gallops.  Respiratory: Clear to auscultation bilaterally. GI: Soft, nontender, non-distended  MS: No edema; No deformity. Right wrist without hematoma. Neuro:  Nonfocal  Psych: Normal affect   Labs    High Sensitivity Troponin:   Recent Labs  Lab 12/01/19 1555 12/01/19 1841  TROPONINIHS 220* 411*      Chemistry Recent Labs  Lab 12/01/19 1555 12/02/19 0143  NA 142 138  K  4.3 4.0  CL 104 104  CO2 27 22  GLUCOSE 106* 120*  BUN 17 11  CREATININE 1.05 0.88  CALCIUM 9.3 9.0  PROT 6.9  --   ALBUMIN 4.5  --   AST 33  --   ALT 26  --   ALKPHOS 52  --   BILITOT 0.8  --   GFRNONAA >60 >60  GFRAA >60 >60  ANIONGAP 11 12     Hematology Recent Labs  Lab 12/01/19 1555 12/02/19 0143  WBC 10.1 8.5  RBC 4.62 4.30  HGB 15.2 13.8  HCT 46.7 42.1  MCV 101.1* 97.9  MCH 32.9 32.1  MCHC 32.5 32.8  RDW 12.0 11.8  PLT 206 192    BNPNo results for input(s): BNP, PROBNP in the last 168 hours.   DDimer No results for input(s): DDIMER in the last 168 hours.   Radiology    CARDIAC CATHETERIZATION  Result Date: 12/01/2019  Balloon angioplasty was performed using a BALLOON SAPPHIRE 2.0X15.  Post intervention, there is a 100% residual stenosis.  Balloon angioplasty was performed.  Post intervention, there is a 80% residual stenosis.  1.  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  Patency of the left main 4.  Patency of the LAD with mild nonobstructive disease 5.  Normal LVEDP 6.  Unsuccessful PCI of the RCA subbranches secondary to residual occlusion of the PLA branch and residual severe stenosis of the PDA branch, vessels not candidates for stenting because of slow overall flow and severe ectasia Recommend: aggrastat x 18 hours, aggressive risk reduction measures, ASA/ticagrelor x 12 months (ACS Class 1 recommendation) consider long-term if tolerated with marked coronary ectasia    Cardiac Studies   Coronary/Graft Acute MI Revascularization  LEFT HEART CATH AND CORONARY ANGIOGRAPHY  Conclusion    Balloon angioplasty was performed using a BALLOON SAPPHIRE 2.0X15.  Post intervention, there is a 100% residual stenosis.  Balloon angioplasty was performed.  Post intervention,  there is a 80% residual stenosis.   1.  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  Patency of the left main 4.  Patency of the LAD with mild nonobstructive disease 5.  Normal LVEDP 6.  Unsuccessful PCI of the RCA subbranches secondary to residual occlusion of the PLA branch and residual severe stenosis of the PDA branch, vessels not candidates for stenting because of slow overall flow and severe ectasia  Recommend: aggrastat x 18 hours, aggressive risk reduction measures, ASA/ticagrelor x 12 months (ACS Class 1 recommendation) consider long-term if tolerated with marked coronary ectasia    Patient Profile     74 y.o. male with OSA, HLD, dilation of esophageal stricture, normal stress test in 2019 transferred from [facility] with acute inferior STEMI  Assessment & Plan    1. 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 Unable to restore antegrade flow with PCI. Likely distal embolization from aneurysmal disease. No obstructive disease in the LCA. Ecg shows improvement in ST elevation and development of Q waves. Troponin up to 411. Will complete Aggrastat infusion this am. Continue DAPT long term given aneurysmal disease. Will add Imdur. Hold Beta blocker due to bradycardia. Transfer to floor today and ambulate. Echo is pending. Anticipate DC home tomorrow.   2. Hyperlipidemia - LDL at 104, on pravastatin at home. Will switch to high dose Crestor.   3. OSA on CPAP - will offer CPAP qhs.  4. Elevated BP without hx of HTN - BP improved this am. If it remains elevated consider adding ARB or amlodipine.    For questions or updates, please contact [place] Please consult [email or web address] for contact info under          Signed, Izik Bingman Martinique, MD  12/02/2019, 8:34 AM

## 2019-12-02 NOTE — Progress Notes (Signed)
Spoke with Dr. Martinique to inform of 9-10 beats of VT and bradycardia 47-50s and to verify IV Zofran administration of Zofran for nausea.  Will give dose as previously ordered and transfer to [facility].  No new orders given.

## 2019-12-02 NOTE — Progress Notes (Signed)
CARDIAC REHAB PHASE I   PRE:  Rate/Rhythm: 64 SR  BP:  Supine:   Sitting: 94/74  Standing:    SaO2: 96%RA  MODE:  Ambulation: 400 ft   POST:  Rate/Rhythm: 73 SR  BP:  Supine:   Sitting: 104/68  Standing:    SaO2: 97%RA 1335-1440 Pt walked 400 ft on RA with steady gait. No CP. Tolerated well. MI education completed with pt and wife who voiced understanding. Reviewed importance of brilinta, NTG use, MI restrictions, heart healthy and low carb foods, walking for exercise and CRP 2. Referred to [facility] program.   Graylon Good, RN BSN  12/02/2019 2:36 PM

## 2019-12-03 ENCOUNTER — Other Ambulatory Visit: Payer: Self-pay

## 2019-12-03 ENCOUNTER — Encounter (HOSPITAL_COMMUNITY): Payer: Self-pay | Admitting: Cardiovascular Disease

## 2019-12-03 DIAGNOSIS — I472 Ventricular tachycardia: Secondary | ICD-10-CM

## 2019-12-03 MED ORDER — MAGNESIUM SULFATE 4 GM/100ML IV SOLN
4.0000 g | Freq: Once | INTRAVENOUS | Status: AC
  Administered 2019-12-03: 4 g via INTRAVENOUS
  Filled 2019-12-03: qty 100

## 2019-12-03 MED ORDER — MAGNESIUM SULFATE 50 % IJ SOLN
2.0000 g | Freq: Once | INTRAMUSCULAR | Status: DC
  Filled 2019-12-03: qty 4

## 2019-12-03 NOTE — Progress Notes (Signed)
RN called RT to check on pt because he said he feels like the flow is too much and his head is hurting. RN gave pt some tylenol and patient said he will wait on the CPAP to wear it again once's he feels better with his headache.

## 2019-12-03 NOTE — Progress Notes (Signed)
Progress Note  Patient Name: Alan Wall Date of Encounter: 12/03/2019  [facility] Cardiologist: No primary care provider on file. New Dr Burt Knack  Subjective   Feels very well today. No chest pain or dyspnea. Denies palpitations.   Inpatient Medications    Scheduled Meds: . aspirin  81 mg Oral Daily  . Chlorhexidine Gluconate Cloth  6 each Topical Daily  . isosorbide mononitrate  30 mg Oral Daily  . loratadine  10 mg Oral Daily  . melatonin  5 mg Oral QHS  . rosuvastatin  40 mg Oral Daily  . sodium chloride flush  3 mL Intravenous Q12H  . ticagrelor  90 mg Oral Q12H   Continuous Infusions: . sodium chloride    . sodium chloride     PRN Meds: sodium chloride, acetaminophen, ondansetron (ZOFRAN) IV, sodium chloride flush, zolpidem   Vital Signs    Vitals:   12/02/19 2209 12/02/19 2311 12/03/19 0404 12/03/19 0735  BP:  116/74 117/86 103/60  Pulse: 68 63 66   Resp: 17 17 18 20   Temp:  97.8 F (36.6 C) 98 F (36.7 C) 98.5 F (36.9 C)  TempSrc:  Oral Oral Oral  SpO2: 97% 98% 100%   Weight:      Height:        Intake/Output Summary (Last 24 hours) at 12/03/2019 0950 Last data filed at 12/02/2019 2200 Gross per 24 hour  Intake 249.75 ml  Output 425 ml  Net -175.25 ml   Last 3 Weights 12/01/2019 10/13/2019 09/05/2018  Weight (lbs) 165 lb 162 lb 6.4 oz 164 lb  Weight (kg) 74.844 kg 73.664 kg 74.39 kg      Telemetry    Sinus brady. Occ. PVCs. 13 beat run of NSVT this am with rapid rate - Personally Reviewed  ECG    None today.  Physical Exam   GEN: No acute distress.   Neck: No JVD Cardiac: RRR, no murmurs, rubs, or gallops.  Respiratory: Clear to auscultation bilaterally. GI: Soft, nontender, non-distended  MS: No edema; No deformity. Right wrist without hematoma. Neuro:  Nonfocal  Psych: Normal affect   Labs    High Sensitivity Troponin:   Recent Labs  Lab 12/01/19 1555 12/01/19 1841  TROPONINIHS 220* 411*      Chemistry Recent Labs    Lab 12/01/19 1555 12/02/19 0143  NA 142 138  K 4.3 4.0  CL 104 104  CO2 27 22  GLUCOSE 106* 120*  BUN 17 11  CREATININE 1.05 0.88  CALCIUM 9.3 9.0  PROT 6.9  --   ALBUMIN 4.5  --   AST 33  --   ALT 26  --   ALKPHOS 52  --   BILITOT 0.8  --   GFRNONAA >60 >60  GFRAA >60 >60  ANIONGAP 11 12     Hematology Recent Labs  Lab 12/01/19 1555 12/02/19 0143  WBC 10.1 8.5  RBC 4.62 4.30  HGB 15.2 13.8  HCT 46.7 42.1  MCV 101.1* 97.9  MCH 32.9 32.1  MCHC 32.5 32.8  RDW 12.0 11.8  PLT 206 192    BNPNo results for input(s): BNP, PROBNP in the last 168 hours.   DDimer No results for input(s): DDIMER in the last 168 hours.   Radiology    CARDIAC CATHETERIZATION  Result Date: 12/01/2019  Balloon angioplasty was performed using a BALLOON SAPPHIRE 2.0X15.  Post intervention, there is a 100% residual stenosis.  Balloon angioplasty was performed.  Post intervention, there is  a 80% residual stenosis.  1.  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  Patency of the left main 4.  Patency of the LAD with mild nonobstructive disease 5.  Normal LVEDP 6.  Unsuccessful PCI of the RCA subbranches secondary to residual occlusion of the PLA branch and residual severe stenosis of the PDA branch, vessels not candidates for stenting because of slow overall flow and severe ectasia Recommend: aggrastat x 18 hours, aggressive risk reduction measures, ASA/ticagrelor x 12 months (ACS Class 1 recommendation) consider long-term if tolerated with marked coronary ectasia   ECHOCARDIOGRAM COMPLETE  Result Date: 12/02/2019    ECHOCARDIOGRAM REPORT   Patient Name:   Alan Wall Date of Exam: 12/02/2019 Medical Rec #:  [id number]     Height:       67.0 in Accession #:    [id number]    Weight:       165.0 lb Date of Birth:   28-Dec-1945    BSA:          1.863 m Patient Age:    74 years      BP:           138/91 mmHg Patient Gender: M             HR:           54 bpm. Exam Location:  Inpatient Procedure: 2D Echo Indications:    chest pain 786.50  History:        Patient has prior history of Echocardiogram examinations, most                 recent 11/17/2013. Acute MI; Risk Factors:Dyslipidemia.  Sonographer:    Jannett Celestine RDCS (AE) Referring Phys: [place]  1. Left ventricular ejection fraction, by estimation, is 45 to 50%. The left ventricle has mildly decreased function. The left ventricle has no regional wall motion abnormalities. Left ventricular diastolic parameters were normal. There is severe hypokinesis of the left ventricular, basal inferior wall, inferoseptal wall and inferolateral wall.  2. Right ventricular systolic function is normal. The right ventricular size is normal. There is normal pulmonary artery systolic pressure. The estimated right ventricular systolic pressure is 13.2 mmHg.  3. The mitral valve is normal in structure. Mild to moderate mitral valve regurgitation. No evidence of mitral stenosis.  4. The aortic valve is normal in structure. Aortic valve regurgitation is not visualized. No aortic stenosis is present.  5. The inferior vena cava is normal in size with greater than 50% respiratory variability, suggesting right atrial pressure of 3 mmHg. FINDINGS  Left Ventricle: Left ventricular ejection fraction, by estimation, is 45 to 50%. The left ventricle has mildly decreased function. The left ventricle has no regional wall motion abnormalities. Severe hypokinesis of the left ventricular, basal inferior wall, inferoseptal wall and inferolateral wall. The left ventricular internal cavity size was normal in size. There is no left ventricular hypertrophy. Left ventricular diastolic parameters were normal. Normal left ventricular filling pressure. Right Ventricle: The right ventricular size  is normal. No increase in right ventricular wall thickness. Right ventricular systolic function is normal. There is normal pulmonary artery systolic pressure. The tricuspid regurgitant velocity is 2.59 m/s, and  with an assumed right atrial pressure of 3 mmHg, the estimated right ventricular systolic pressure is 29.8  mmHg. Left Atrium: Left atrial size was normal in size. Right Atrium: Right atrial size was normal in size. Pericardium: There is no evidence of pericardial effusion. Mitral Valve: The mitral valve is normal in structure. Normal mobility of the mitral valve leaflets. Mild to moderate mitral valve regurgitation. No evidence of mitral valve stenosis. Tricuspid Valve: The tricuspid valve is normal in structure. Tricuspid valve regurgitation is mild . No evidence of tricuspid stenosis. Aortic Valve: The aortic valve is normal in structure. Aortic valve regurgitation is not visualized. No aortic stenosis is present. Pulmonic Valve: The pulmonic valve was normal in structure. Pulmonic valve regurgitation is not visualized. No evidence of pulmonic stenosis. Aorta: The aortic root is normal in size and structure. Venous: The inferior vena cava is normal in size with greater than 50% respiratory variability, suggesting right atrial pressure of 3 mmHg. IAS/Shunts: No atrial level shunt detected by color flow Doppler.  LEFT VENTRICLE PLAX 2D LVIDd:         4.00 cm  Diastology LVIDs:         3.00 cm  LV e' lateral:   11.60 cm/s LV PW:         1.10 cm  LV E/e' lateral: 5.1 LV IVS:        1.10 cm  LV e' medial:    7.29 cm/s LVOT diam:     2.00 cm  LV E/e' medial:  8.2 LV SV:         83 LV SV Index:   45 LVOT Area:     3.14 cm  RIGHT VENTRICLE RV S prime:     10.30 cm/s TAPSE (M-mode): 2.2 cm LEFT ATRIUM           Index LA diam:      3.50 cm 1.88 cm/m LA Vol (A2C): 37.8 ml 20.28 ml/m  AORTIC VALVE LVOT Vmax:   118.00 cm/s LVOT Vmean:  80.400 cm/s LVOT VTI:    0.265 m  AORTA Ao Root diam: 3.40 cm MITRAL VALVE                TRICUSPID VALVE MV Area (PHT): 2.99 cm    TR Peak grad:   26.8 mmHg MV Decel Time: 254 msec    TR Vmax:        259.00 cm/s MV E velocity: 59.60 cm/s MV A velocity: 54.00 cm/s  SHUNTS MV E/A ratio:  1.10        Systemic VTI:  0.26 m                            Systemic Diam: 2.00 cm Dani Gobble Croitoru MD Electronically signed by Sanda Klein MD Signature Date/Time: 12/02/2019/8:52:45 AM    Final     Cardiac Studies   Coronary/Graft Acute MI Revascularization  LEFT HEART CATH AND CORONARY ANGIOGRAPHY  Conclusion    Balloon angioplasty was performed using a BALLOON SAPPHIRE 2.0X15.  Post intervention, there is a 100% residual stenosis.  Balloon angioplasty was performed.  Post intervention, there is a 80% residual stenosis.   1.  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  Patency of the left main 4.  Patency of the LAD with mild nonobstructive disease 5.  Normal LVEDP 6.  Unsuccessful PCI of the RCA  subbranches secondary to residual occlusion of the PLA branch and residual severe stenosis of the PDA branch, vessels not candidates for stenting because of slow overall flow and severe ectasia  Recommend: aggrastat x 18 hours, aggressive risk reduction measures, ASA/ticagrelor x 12 months (ACS Class 1 recommendation) consider long-term if tolerated with marked coronary ectasia  Echo 12/01/19: IMPRESSIONS    1. Left ventricular ejection fraction, by estimation, is 45 to 50%. The  left ventricle has mildly decreased function. The left ventricle has no  regional wall motion abnormalities. Left ventricular diastolic parameters  were normal. There is severe  hypokinesis of the left ventricular, basal inferior wall, inferoseptal  wall and inferolateral wall.  2. Right ventricular systolic function is  normal. The right ventricular  size is normal. There is normal pulmonary artery systolic pressure. The  estimated right ventricular systolic pressure is 22.6 mmHg.  3. The mitral valve is normal in structure. Mild to moderate mitral valve  regurgitation. No evidence of mitral stenosis.  4. The aortic valve is normal in structure. Aortic valve regurgitation is  not visualized. No aortic stenosis is present.  5. The inferior vena cava is normal in size with greater than 50%  respiratory variability, suggesting right atrial pressure of 3 mmHg.   Patient Profile     74 y.o. male with OSA, HLD, dilation of esophageal stricture, normal stress test in 2019 transferred from [facility] with acute inferior STEMI  Assessment & Plan    1. 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 Unable to restore antegrade flow with PCI. Likely distal embolization from aneurysmal disease. No obstructive disease in the LCA. Ecg shows improvement in ST elevation and development of Q waves. Troponin up to 411.  Continue DAPT long term given aneurysmal disease. Unfortunately Brilinta is $250 on his insurance. Will plan on using for first month but will likely need to transition to Plavix long term.  Continue  Imdur. Hold Beta blocker due to bradycardia.   2. Hyperlipidemia - LDL at 104, on pravastatin at home. Will switch to high dose Crestor.   3. OSA on CPAP - will offer CPAP qhs.  4. Elevated BP without hx of HTN - BP is normal to low now.   5. NSVT. Not symptomatic. Unable to give beta blocker due to bradycardia. Potassium 4.0. Mg 1.8. will replete magnesium today. Given rhythm disturbance I would recommend observing one more day in hospital.   For questions or updates, please contact [place] Please consult [email or web address] for contact info under        Signed, Mcgregor Tinnon [place], MD  12/03/2019, 9:50 AM

## 2019-12-03 NOTE — Plan of Care (Signed)
°  Problem: Education: Goal: Knowledge of General Education information will improve Description: Including pain rating scale, medication(s)/side effects and non-pharmacologic comfort measures Outcome: Progressing   Problem: Health Behavior/Discharge Planning: Goal: Ability to manage health-related needs will improve Outcome: Adequate for Discharge   Problem: Activity: Goal: Risk for activity intolerance will decrease Outcome: Progressing   Problem: Nutrition: Goal: Adequate nutrition will be maintained Outcome: Progressing   Problem: Pain Managment: Goal: General experience of comfort will improve Outcome: Adequate for Discharge   Problem: Safety: Goal: Ability to remain free from injury will improve Outcome: Not Applicable

## 2019-12-03 NOTE — Progress Notes (Signed)
RT went in the room to check on the patient and to see when he would like to go on CPAP. Pt said he got his home unit and he would be fine by putting it on by himself. RT will continue to monitor.

## 2019-12-03 NOTE — Progress Notes (Signed)
CARDIAC REHAB PHASE I   PRE:  Rate/Rhythm: 71 SR  BP:  Supine: 116/72  Sitting:   Standing:    SaO2: 96%RA  MODE:  Ambulation: 440 ft   POST:  Rate/Rhythm: 94 SR  BP:  Supine:   Sitting: 96/74  Standing:    SaO2: 97%RA 1447-1510 Pt walked 440 ft on RA with steady gait and tolerated well. No CP.   Graylon Good, RN BSN  12/03/2019 3:06 PM

## 2019-12-04 ENCOUNTER — Ambulatory Visit: Payer: Medicare HMO

## 2019-12-04 DIAGNOSIS — I472 Ventricular tachycardia: Secondary | ICD-10-CM

## 2019-12-04 DIAGNOSIS — E7800 Pure hypercholesterolemia, unspecified: Secondary | ICD-10-CM

## 2019-12-04 DIAGNOSIS — I4729 Other ventricular tachycardia: Secondary | ICD-10-CM

## 2019-12-04 LAB — BASIC METABOLIC PANEL
Anion gap: 8 (ref 5–15)
BUN: 11 mg/dL (ref 8–23)
CO2: 26 mmol/L (ref 22–32)
Calcium: 8.5 mg/dL — ABNORMAL LOW (ref 8.9–10.3)
Chloride: 102 mmol/L (ref 98–111)
Creatinine, Ser: 1.1 mg/dL (ref 0.61–1.24)
GFR calc Af Amer: >60 mL/min (ref >60)
GFR calc non Af Amer: >60 mL/min (ref >60)
Glucose, Bld: 119 mg/dL — ABNORMAL HIGH (ref 70–99)
Potassium: 3.8 mmol/L (ref 3.5–5.1)
Sodium: 136 mmol/L (ref 135–145)

## 2019-12-04 LAB — MAGNESIUM: Magnesium: 2.3 mg/dL (ref 1.7–2.4)

## 2019-12-04 MED ORDER — METOPROLOL SUCCINATE ER 25 MG PO TB24: 25.0000 mg | ORAL_TABLET | Freq: Every day | ORAL | 3 refills | Status: DC

## 2019-12-04 MED ORDER — ISOSORBIDE MONONITRATE ER 30 MG PO TB24: 30.0000 mg | ORAL_TABLET | Freq: Every day | ORAL | 3 refills | Status: AC

## 2019-12-04 MED ORDER — ASPIRIN EC 81 MG PO TBEC: 81.0000 mg | DELAYED_RELEASE_TABLET | Freq: Every day | ORAL | 2 refills | Status: AC

## 2019-12-04 MED ORDER — METOPROLOL SUCCINATE ER 25 MG PO TB24
25.0000 mg | ORAL_TABLET | Freq: Every day | ORAL | Status: DC
  Administered 2019-12-04: 25 mg via ORAL
  Filled 2019-12-04: qty 1

## 2019-12-04 MED ORDER — TICAGRELOR 90 MG PO TABS: 90.0000 mg | ORAL_TABLET | Freq: Two times a day (BID) | ORAL | 3 refills | Status: DC

## 2019-12-04 MED ORDER — ROSUVASTATIN CALCIUM 40 MG PO TABS: 40.0000 mg | ORAL_TABLET | Freq: Every day | ORAL | 3 refills | Status: DC

## 2019-12-04 MED ORDER — NITROGLYCERIN 0.4 MG SL SUBL
0.4000 mg | SUBLINGUAL_TABLET | SUBLINGUAL | 2 refills | Status: DC | PRN
Start: 2019-12-04 — End: 2022-06-26

## 2019-12-04 NOTE — Discharge Summary (Addendum)
Discharge Summary    Patient ID: Alan Wall MRN: [id number]; DOB: [birth year]  Admit date: 12/01/2019 Discharge date: 12/04/2019  Primary Care Provider: Barbra Sarks, MD  Primary Cardiologist: Sherren Mocha, MD  Primary Electrophysiologist:  None   Discharge Diagnoses    Principal Problem:   Acute inferior myocardial infarction [facility]) Active Problems:   OSA on CPAP   ST elevation myocardial infarction involving right coronary artery [facility])   NSVT (nonsustained ventricular tachycardia) ([place])   Hypercholesterolemia    Diagnostic Studies/Procedures    Left heart cath 12/01/19:  Balloon angioplasty was performed using a BALLOON SAPPHIRE 2.0X15.  Post intervention, there is a 100% residual stenosis.  Balloon angioplasty was performed.  Post intervention, there is a 80% residual stenosis.   1.  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  Patency of the left main 4.  Patency of the LAD with mild nonobstructive disease 5.  Normal LVEDP 6.  Unsuccessful PCI of the RCA subbranches secondary to residual occlusion of the PLA branch and residual severe stenosis of the PDA branch, vessels not candidates for stenting because of slow overall flow and severe ectasia  Recommend: aggrastat x 18 hours, aggressive risk reduction measures, ASA/ticagrelor x 12 months (ACS Class 1 recommendation) consider long-term if tolerated with marked coronary ectasia  _____________   Echo 12/02/19: 1. Left ventricular ejection fraction, by estimation, is 45 to 50%. The  left ventricle has mildly decreased function. The left ventricle has no  regional wall motion abnormalities. Left ventricular diastolic parameters  were normal. There is severe  hypokinesis of the left ventricular, basal inferior wall, inferoseptal    wall and inferolateral wall.  2. Right ventricular systolic function is normal. The right ventricular  size is normal. There is normal pulmonary artery systolic pressure. The  estimated right ventricular systolic pressure is 08.1 mmHg.  3. The mitral valve is normal in structure. Mild to moderate mitral valve  regurgitation. No evidence of mitral stenosis.  4. The aortic valve is normal in structure. Aortic valve regurgitation is  not visualized. No aortic stenosis is present.  5. The inferior vena cava is normal in size with greater than 50%  respiratory variability, suggesting right atrial pressure of 3 mmHg.   History of Present Illness     Alan Wall is a 74 y.o. male with OSA, HLD, dilation of esophageal stricture, normal stress test in 2019 transferred from [facility] with acute inferior STEMI.  [name] denies any formal cardiac history except does have a history of elevated calcium score of 573 by screening in 03/2017. He also has a family history of CAD in his father in his 21s. He was in his [employer] today sitting at a computer when he developed chest pain with nausea and numbness down his right arm around 2:15pm. He contacted his primary provider at [facility] and was recommended to seek care in the emergency room. He arrived to the ED where in triage his EKG demonstrated concern for inferior STEMI. Code STEMI was activated and he was transferred to the [facility] cath lab for further management. He received 324mg  ASA and initiation of heparin en route. Upon arrival he is pain free and in good spirits. He denies any tobacco or drug use. Drinks 2 drinks per week. Denies any hx of  TIA, CVA, bleeding, recent fevers, chills or URI symptoms. Covid swab and labs pending. Reports hx of environmental allergies to dander but denies medication allergies.  Hospital Course     Consultants: none  Acute inferior STEMI He was taken emergently to the cath lab 12/01/19. 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 Unabel to restore anterograde flow with PCI. Likely distal embolization from aneurysmal disease. No obstructive disease in the left coronary system. EKG with improved ST elevation and development of Q waves. He was placed on DAPT long-term given aneurysm. Brilinta will be $250 per month. Will likely need to switch to plavix after the first month and continue long term. Continue imdur. BB initially held for bradycardia, but HR today in the 70s. Low dose torpol started.    Hyperlipidemia with LDL goal < 70 12/01/2019: Cholesterol 179; HDL 60; LDL Cholesterol 104; Triglycerides 76; VLDL 15 Statin switched to 40 mg crestor.  Will need to recheck lipids in 6 weeks.   OSA on CPAP Continue   NSVT Asymptomatic. Unable to treat with BB given bradycardia yesterday. K 4.0, Mg 1.8, replaced. He was observed one more day given rhythm disturbance. Today, HR in the 70s, BB started.   Pt seen and examined by Dr. Claiborne Billings and deemed stable for discharge. Follow up has been made.   Did the patient have an acute coronary syndrome (MI, NSTEMI, STEMI, etc) this admission?:  Yes                               AHA/ACC Clinical Performance & Quality Measures: 1. Aspirin prescribed? - Yes 2. ADP Receptor Inhibitor (Plavix/Clopidogrel, Brilinta/Ticagrelor or Effient/Prasugrel) prescribed (includes medically managed patients)? - Yes 3. Beta Blocker prescribed? - Yes 4. High Intensity Statin (Lipitor 40-80mg  or Crestor 20-40mg ) prescribed? - Yes 5. EF assessed during THIS hospitalization? - Yes 6. For EF <40%, was ACEI/ARB prescribed? - Not Applicable (EF >/= 09%) 7. For EF <40%, Aldosterone Antagonist (Spironolactone or Eplerenone) prescribed? - Not Applicable (EF >/= 98%) 8. Cardiac Rehab Phase II ordered (including medically managed patients)? - Yes   _____________  Discharge Vitals Blood pressure 111/69, pulse 71, temperature 98.2 F (36.8 C),  temperature source Oral, resp. rate 16, height 5\' 7"  (1.702 m), weight 74.8 kg, SpO2 98 %.  Filed Weights   12/01/19 1555  Weight: 74.8 kg    Labs & Radiologic Studies    CBC Recent Labs    12/01/19 1555 12/02/19 0143  WBC 10.1 8.5  NEUTROABS 7.8*  --   HGB 15.2 13.8  HCT 46.7 42.1  MCV 101.1* 97.9  PLT 206 338   Basic Metabolic Panel Recent Labs    12/02/19 0143 12/04/19 0030  NA 138 136  K 4.0 3.8  CL 104 102  CO2 22 26  GLUCOSE 120* 119*  BUN 11 11  CREATININE 0.88 1.10  CALCIUM 9.0 8.5*  MG 1.8 2.3   Liver Function Tests Recent Labs    12/01/19 1555  AST 33  ALT 26  ALKPHOS 52  BILITOT 0.8  PROT 6.9  ALBUMIN 4.5   No results for input(s): LIPASE, AMYLASE in the last 72 hours. High Sensitivity Troponin:   Recent Labs  Lab 12/01/19 1555 12/01/19 1841  TROPONINIHS 220* 411*    BNP Invalid input(s): POCBNP D-Dimer No results for input(s): DDIMER in the last 72 hours. Hemoglobin A1C Recent Labs    12/01/19  1555  HGBA1C 5.9*   Fasting Lipid Panel Recent Labs    12/01/19 1555  CHOL 179  HDL 60  LDLCALC 104*  TRIG 76  CHOLHDL 3.0   Thyroid Function Tests No results for input(s): TSH, T4TOTAL, T3FREE, THYROIDAB in the last 72 hours.  Invalid input(s): FREET3 _____________  CARDIAC CATHETERIZATION  Result Date: 12/01/2019  Balloon angioplasty was performed using a BALLOON SAPPHIRE 2.0X15.  Post intervention, there is a 100% residual stenosis.  Balloon angioplasty was performed.  Post intervention, there is a 80% residual stenosis.  1.  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  Patency of the left main 4.  Patency of the LAD with mild nonobstructive disease 5.  Normal LVEDP 6.  Unsuccessful PCI of the RCA subbranches secondary to residual occlusion of the PLA  branch and residual severe stenosis of the PDA branch, vessels not candidates for stenting because of slow overall flow and severe ectasia Recommend: aggrastat x 18 hours, aggressive risk reduction measures, ASA/ticagrelor x 12 months (ACS Class 1 recommendation) consider long-term if tolerated with marked coronary ectasia   ECHOCARDIOGRAM COMPLETE  Result Date: 12/02/2019    ECHOCARDIOGRAM REPORT   Patient Name:   KESHAV WINEGAR Date of Exam: 12/02/2019 Medical Rec #:  [id number]     Height:       67.0 in Accession #:    [id number]    Weight:       165.0 lb Date of Birth:  10-17-1945    BSA:          1.863 m Patient Age:    74 years      BP:           138/91 mmHg Patient Gender: M             HR:           54 bpm. Exam Location:  Inpatient Procedure: 2D Echo Indications:    chest pain 786.50  History:        Patient has prior history of Echocardiogram examinations, most                 recent 11/17/2013. Acute MI; Risk Factors:Dyslipidemia.  Sonographer:    Jannett Celestine RDCS (AE) Referring Phys: [place]  1. Left ventricular ejection fraction, by estimation, is 45 to 50%. The left ventricle has mildly decreased function. The left ventricle has no regional wall motion abnormalities. Left ventricular diastolic parameters were normal. There is severe hypokinesis of the left ventricular, basal inferior wall, inferoseptal wall and inferolateral wall.  2. Right ventricular systolic function is normal. The right ventricular size is normal. There is normal pulmonary artery systolic pressure. The estimated right ventricular systolic pressure is 94.1 mmHg.  3. The mitral valve is normal in structure. Mild to moderate mitral valve regurgitation. No evidence of mitral stenosis.  4. The aortic valve is normal in structure. Aortic valve regurgitation is not visualized. No aortic stenosis is present.  5. The inferior vena cava is normal in size with greater than 50% respiratory variability, suggesting  right atrial pressure of 3 mmHg. FINDINGS  Left Ventricle: Left ventricular ejection fraction, by estimation, is 45 to 50%. The left ventricle has mildly decreased function. The left ventricle has no regional wall motion abnormalities. Severe hypokinesis of the left  ventricular, basal inferior wall, inferoseptal wall and inferolateral wall. The left ventricular internal cavity size was normal in size. There is no left ventricular hypertrophy. Left ventricular diastolic parameters were normal. Normal left ventricular filling pressure. Right Ventricle: The right ventricular size is normal. No increase in right ventricular wall thickness. Right ventricular systolic function is normal. There is normal pulmonary artery systolic pressure. The tricuspid regurgitant velocity is 2.59 m/s, and  with an assumed right atrial pressure of 3 mmHg, the estimated right ventricular systolic pressure is 86.7 mmHg. Left Atrium: Left atrial size was normal in size. Right Atrium: Right atrial size was normal in size. Pericardium: There is no evidence of pericardial effusion. Mitral Valve: The mitral valve is normal in structure. Normal mobility of the mitral valve leaflets. Mild to moderate mitral valve regurgitation. No evidence of mitral valve stenosis. Tricuspid Valve: The tricuspid valve is normal in structure. Tricuspid valve regurgitation is mild . No evidence of tricuspid stenosis. Aortic Valve: The aortic valve is normal in structure. Aortic valve regurgitation is not visualized. No aortic stenosis is present. Pulmonic Valve: The pulmonic valve was normal in structure. Pulmonic valve regurgitation is not visualized. No evidence of pulmonic stenosis. Aorta: The aortic root is normal in size and structure. Venous: The inferior vena cava is normal in size with greater than 50% respiratory variability, suggesting right atrial pressure of 3 mmHg. IAS/Shunts: No atrial level shunt detected by color flow Doppler.  LEFT VENTRICLE PLAX 2D  LVIDd:         4.00 cm  Diastology LVIDs:         3.00 cm  LV e' lateral:   11.60 cm/s LV PW:         1.10 cm  LV E/e' lateral: 5.1 LV IVS:        1.10 cm  LV e' medial:    7.29 cm/s LVOT diam:     2.00 cm  LV E/e' medial:  8.2 LV SV:         83 LV SV Index:   45 LVOT Area:     3.14 cm  RIGHT VENTRICLE RV S prime:     10.30 cm/s TAPSE (M-mode): 2.2 cm LEFT ATRIUM           Index LA diam:      3.50 cm 1.88 cm/m LA Vol (A2C): 37.8 ml 20.28 ml/m  AORTIC VALVE LVOT Vmax:   118.00 cm/s LVOT Vmean:  80.400 cm/s LVOT VTI:    0.265 m  AORTA Ao Root diam: 3.40 cm MITRAL VALVE               TRICUSPID VALVE MV Area (PHT): 2.99 cm    TR Peak grad:   26.8 mmHg MV Decel Time: 254 msec    TR Vmax:        259.00 cm/s MV E velocity: 59.60 cm/s MV A velocity: 54.00 cm/s  SHUNTS MV E/A ratio:  1.10        Systemic VTI:  0.26 m                            Systemic Diam: 2.00 cm Dani Gobble Croitoru MD Electronically signed by Sanda Klein MD Signature Date/Time: 12/02/2019/8:52:45 AM    Final    Disposition   Pt is being discharged home today in good condition.  Follow-up Plans & Appointments     Follow-up Information    Liliane Shi, PA-C Follow up  on 12/22/2019.   Specialties: Cardiology, Physician Assistant Why: 8:45 am Contact information: [address] [address] [phone]              Discharge Instructions    Amb Referral to Cardiac Rehabilitation   Complete by: As directed    Diagnosis: STEMI   After initial evaluation and assessments completed: Virtual Based Care may be provided alone or in conjunction with Phase 2 Cardiac Rehab based on patient barriers.: Yes   Diet - low sodium heart healthy   Complete by: As directed    Discharge instructions   Complete by: As directed    No driving for 1 week. No lifting over 5 lbs for 1 week. No sexual activity for 1 week. You may return to work in 1 week. Keep procedure site clean & dry. If you notice increased pain,  swelling, bleeding or pus, call/return!  You may shower, but no soaking baths/hot tubs/pools for 1 week.   Increase activity slowly   Complete by: As directed       Discharge Medications   Allergies as of 12/04/2019      Reactions   Bee Venom Swelling   Swelling at injection site   Other Itching   Reactions to cats and cat dander - itching       Medication List    STOP taking these medications   ibuprofen 200 MG tablet Commonly known as: ADVIL   pravastatin 40 MG tablet Commonly known as: PRAVACHOL     TAKE these medications   aspirin EC 81 MG tablet Take 1 tablet (81 mg total) by mouth daily. Swallow whole.   fluticasone 50 MCG/ACT nasal spray Commonly known as: FLONASE Place 2 sprays into both nostrils daily as needed for allergies or rhinitis.   isosorbide mononitrate 30 MG 24 hr tablet Commonly known as: IMDUR Take 1 tablet (30 mg total) by mouth daily. Start taking on: December 05, 2019   Loratadine 10 MG Caps Take by mouth.   melatonin 5 MG Tabs Take 5-10 mg by mouth at bedtime as needed (sleep).   metoprolol succinate 25 MG 24 hr tablet Commonly known as: TOPROL-XL Take 1 tablet (25 mg total) by mouth daily. Start taking on: December 05, 2019   nitroGLYCERIN 0.4 MG SL tablet Commonly known as: Nitrostat Place 1 tablet (0.4 mg total) under the tongue every 5 (five) minutes as needed for chest pain.   nystatin cream Commonly known as: MYCOSTATIN Apply topically 2 (two) times daily. What changed:   how much to take  when to take this  reasons to take this   rosuvastatin 40 MG tablet Commonly known as: CRESTOR Take 1 tablet (40 mg total) by mouth daily. Start taking on: December 05, 2019   ticagrelor 90 MG Tabs tablet Commonly known as: BRILINTA Take 1 tablet (90 mg total) by mouth every 12 (twelve) hours.   VITAMIN D3 PO Take 1 tablet by mouth daily.   zaleplon 5 MG capsule Commonly known as: SONATA Take 5-10 mg by mouth at bedtime as needed  for sleep.   zolpidem 5 MG tablet Commonly known as: AMBIEN Take 5 mg by mouth at bedtime as needed.          Outstanding Labs/Studies    Duration of Discharge Encounter   Greater than 30 minutes including physician time.  Signed, Tami Lin Deziree Mokry, PA 12/04/2019, 11:19 AM

## 2019-12-04 NOTE — TOC Transition Note (Signed)
Transition of Care [facility]) - CM/SW Discharge Note   Patient Details  Name: Alan Wall MRN: [id number] Date of Birth: 1946-02-14  Transition of Care [facility]) CM/SW Contact:  [name] Carmine, RN Phone Number: 12/04/2019, 1:45 PM   Clinical Narrative:    Kary Kos card given by Nursing prior to discharge. No further needs identified.         Patient Goals and CMS Choice        Discharge Placement                       Discharge Plan and Services                                     Social Determinants of Health (SDOH) Interventions     Readmission Risk Interventions No flowsheet data found.

## 2019-12-04 NOTE — Progress Notes (Signed)
Progress Note  Patient Name: KASPIAN MUCCIO Date of Encounter: 12/04/2019  [facility] Cardiologist: No primary care provider on file. New Dr Burt Knack  Subjective   Feels very well today. No chest pain or dyspnea. Denies palpitations. Ambulating well  Inpatient Medications    Scheduled Meds: . aspirin  81 mg Oral Daily  . isosorbide mononitrate  30 mg Oral Daily  . loratadine  10 mg Oral Daily  . melatonin  5 mg Oral QHS  . rosuvastatin  40 mg Oral Daily  . sodium chloride flush  3 mL Intravenous Q12H  . ticagrelor  90 mg Oral Q12H   Continuous Infusions: . sodium chloride    . sodium chloride     PRN Meds: sodium chloride, acetaminophen, ondansetron (ZOFRAN) IV, sodium chloride flush, zolpidem   Vital Signs    Vitals:   12/03/19 1929 12/03/19 2021 12/03/19 2339 12/04/19 0405  BP: 108/78  110/74 109/85  Pulse: 74 77 70 71  Resp: 19 20 18 17   Temp: 98.8 F (37.1 C)  98.9 F (37.2 C) 99.2 F (37.3 C)  TempSrc: Oral  Oral Oral  SpO2: 98% 99% 99% 98%  Weight:      Height:        Intake/Output Summary (Last 24 hours) at 12/04/2019 0725 Last data filed at 12/03/2019 2134 Gross per 24 hour  Intake 123 ml  Output --  Net 123 ml   Last 3 Weights 12/01/2019 10/13/2019 09/05/2018  Weight (lbs) 165 lb 162 lb 6.4 oz 164 lb  Weight (kg) 74.844 kg 73.664 kg 74.39 kg      Telemetry    NSR. Occ. PVCs. No further NSVT - Personally Reviewed  ECG    None today.  Physical Exam   GEN: No acute distress.   Neck: No JVD Cardiac: RRR, no murmurs, rubs, or gallops.  Respiratory: Clear to auscultation bilaterally. GI: Soft, nontender, non-distended  MS: No edema; No deformity. Right wrist without hematoma. Neuro:  Nonfocal  Psych: Normal affect   Labs    High Sensitivity Troponin:   Recent Labs  Lab 12/01/19 1555 12/01/19 1841  TROPONINIHS 220* 411*      Chemistry Recent Labs  Lab 12/01/19 1555 12/02/19 0143 12/04/19 0030  NA 142 138 136  K 4.3 4.0 3.8   CL 104 104 102  CO2 27 22 26   GLUCOSE 106* 120* 119*  BUN 17 11 11   CREATININE 1.05 0.88 1.10  CALCIUM 9.3 9.0 8.5*  PROT 6.9  --   --   ALBUMIN 4.5  --   --   AST 33  --   --   ALT 26  --   --   ALKPHOS 52  --   --   BILITOT 0.8  --   --   GFRNONAA >60 >60 >60  GFRAA >60 >60 >60  ANIONGAP 11 12 8      Hematology Recent Labs  Lab 12/01/19 1555 12/02/19 0143  WBC 10.1 8.5  RBC 4.62 4.30  HGB 15.2 13.8  HCT 46.7 42.1  MCV 101.1* 97.9  MCH 32.9 32.1  MCHC 32.5 32.8  RDW 12.0 11.8  PLT 206 192    BNPNo results for input(s): BNP, PROBNP in the last 168 hours.   DDimer No results for input(s): DDIMER in the last 168 hours.   Radiology    ECHOCARDIOGRAM COMPLETE  Result Date: 12/02/2019    ECHOCARDIOGRAM REPORT   Patient Name:   Kathlen Brunswick Date of Exam:  12/02/2019 Medical Rec #:  [id number]     Height:       67.0 in Accession #:    [id number]    Weight:       165.0 lb Date of Birth:  May 17, 1945    BSA:          1.863 m Patient Age:    65 years      BP:           138/91 mmHg Patient Gender: M             HR:           54 bpm. Exam Location:  Inpatient Procedure: 2D Echo Indications:    chest pain 786.50  History:        Patient has prior history of Echocardiogram examinations, most                 recent 11/17/2013. Acute MI; Risk Factors:Dyslipidemia.  Sonographer:    Jannett Celestine RDCS (AE) Referring Phys: [place]  1. Left ventricular ejection fraction, by estimation, is 45 to 50%. The left ventricle has mildly decreased function. The left ventricle has no regional wall motion abnormalities. Left ventricular diastolic parameters were normal. There is severe hypokinesis of the left ventricular, basal inferior wall, inferoseptal wall and inferolateral wall.  2. Right ventricular systolic function is normal. The right ventricular size is normal. There is normal pulmonary artery systolic pressure. The estimated right ventricular systolic pressure is 02.5 mmHg.   3. The mitral valve is normal in structure. Mild to moderate mitral valve regurgitation. No evidence of mitral stenosis.  4. The aortic valve is normal in structure. Aortic valve regurgitation is not visualized. No aortic stenosis is present.  5. The inferior vena cava is normal in size with greater than 50% respiratory variability, suggesting right atrial pressure of 3 mmHg. FINDINGS  Left Ventricle: Left ventricular ejection fraction, by estimation, is 45 to 50%. The left ventricle has mildly decreased function. The left ventricle has no regional wall motion abnormalities. Severe hypokinesis of the left ventricular, basal inferior wall, inferoseptal wall and inferolateral wall. The left ventricular internal cavity size was normal in size. There is no left ventricular hypertrophy. Left ventricular diastolic parameters were normal. Normal left ventricular filling pressure. Right Ventricle: The right ventricular size is normal. No increase in right ventricular wall thickness. Right ventricular systolic function is normal. There is normal pulmonary artery systolic pressure. The tricuspid regurgitant velocity is 2.59 m/s, and  with an assumed right atrial pressure of 3 mmHg, the estimated right ventricular systolic pressure is 85.2 mmHg. Left Atrium: Left atrial size was normal in size. Right Atrium: Right atrial size was normal in size. Pericardium: There is no evidence of pericardial effusion. Mitral Valve: The mitral valve is normal in structure. Normal mobility of the mitral valve leaflets. Mild to moderate mitral valve regurgitation. No evidence of mitral valve stenosis. Tricuspid Valve: The tricuspid valve is normal in structure. Tricuspid valve regurgitation is mild . No evidence of tricuspid stenosis. Aortic Valve: The aortic valve is normal in structure. Aortic valve regurgitation is not visualized. No aortic stenosis is present. Pulmonic Valve: The pulmonic valve was normal in structure. Pulmonic valve  regurgitation is not visualized. No evidence of pulmonic stenosis. Aorta: The aortic root is normal in size and structure. Venous: The inferior vena cava is normal in size with greater than 50% respiratory variability, suggesting right atrial pressure of 3 mmHg. IAS/Shunts: No atrial level shunt  detected by color flow Doppler.  LEFT VENTRICLE PLAX 2D LVIDd:         4.00 cm  Diastology LVIDs:         3.00 cm  LV e' lateral:   11.60 cm/s LV PW:         1.10 cm  LV E/e' lateral: 5.1 LV IVS:        1.10 cm  LV e' medial:    7.29 cm/s LVOT diam:     2.00 cm  LV E/e' medial:  8.2 LV SV:         83 LV SV Index:   45 LVOT Area:     3.14 cm  RIGHT VENTRICLE RV S prime:     10.30 cm/s TAPSE (M-mode): 2.2 cm LEFT ATRIUM           Index LA diam:      3.50 cm 1.88 cm/m LA Vol (A2C): 37.8 ml 20.28 ml/m  AORTIC VALVE LVOT Vmax:   118.00 cm/s LVOT Vmean:  80.400 cm/s LVOT VTI:    0.265 m  AORTA Ao Root diam: 3.40 cm MITRAL VALVE               TRICUSPID VALVE MV Area (PHT): 2.99 cm    TR Peak grad:   26.8 mmHg MV Decel Time: 254 msec    TR Vmax:        259.00 cm/s MV E velocity: 59.60 cm/s MV A velocity: 54.00 cm/s  SHUNTS MV E/A ratio:  1.10        Systemic VTI:  0.26 m                            Systemic Diam: 2.00 cm Dani Gobble Croitoru MD Electronically signed by Sanda Klein MD Signature Date/Time: 12/02/2019/8:52:45 AM    Final     Cardiac Studies   Coronary/Graft Acute MI Revascularization  LEFT HEART CATH AND CORONARY ANGIOGRAPHY  Conclusion    Balloon angioplasty was performed using a BALLOON SAPPHIRE 2.0X15.  Post intervention, there is a 100% residual stenosis.  Balloon angioplasty was performed.  Post intervention, there is a 80% residual stenosis.   1.  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  Patency of the left  main 4.  Patency of the LAD with mild nonobstructive disease 5.  Normal LVEDP 6.  Unsuccessful PCI of the RCA subbranches secondary to residual occlusion of the PLA branch and residual severe stenosis of the PDA branch, vessels not candidates for stenting because of slow overall flow and severe ectasia  Recommend: aggrastat x 18 hours, aggressive risk reduction measures, ASA/ticagrelor x 12 months (ACS Class 1 recommendation) consider long-term if tolerated with marked coronary ectasia  Echo 12/01/19: IMPRESSIONS    1. Left ventricular ejection fraction, by estimation, is 45 to 50%. The  left ventricle has mildly decreased function. The left ventricle has no  regional wall motion abnormalities. Left ventricular diastolic parameters  were normal. There is severe  hypokinesis of the left ventricular, basal inferior wall, inferoseptal  wall and inferolateral wall.  2. Right ventricular systolic function is normal. The right ventricular  size is normal. There is normal pulmonary artery systolic pressure. The  estimated right ventricular systolic pressure is 57.3 mmHg.  3. The  mitral valve is normal in structure. Mild to moderate mitral valve  regurgitation. No evidence of mitral stenosis.  4. The aortic valve is normal in structure. Aortic valve regurgitation is  not visualized. No aortic stenosis is present.  5. The inferior vena cava is normal in size with greater than 50%  respiratory variability, suggesting right atrial pressure of 3 mmHg.   Patient Profile     74 y.o. male with OSA, HLD, dilation of esophageal stricture, normal stress test in 2019 transferred from [facility] with acute inferior STEMI  Assessment & Plan    1. 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 Unable to restore antegrade flow with PCI. Likely distal embolization from aneurysmal disease. No obstructive disease in the LCA.  Ecg shows improvement in ST elevation and development of Q waves. Troponin up to 411.  Continue DAPT long term given aneurysmal disease. Unfortunately Brilinta is $250 on his insurance. Will plan on using for first month with free card but will likely need to transition to Plavix long term.  Continue  Imdur. Previously had not started beta blocker due to bradycardia but now HR in the 70s. Will start Toprol XL 25 mg daily.    2. Hyperlipidemia - LDL at 104, on pravastatin at home. Will switch to high dose Crestor.   3. OSA on CPAP - will offer CPAP qhs.  4. Elevated BP without hx of HTN - BP is normal   5. NSVT. Not symptomatic.  Potassium 4.0. Mg 1.8>> 2.3 with repletion. Will start low dose beta blocker today.   For questions or updates, please contact [place] Please consult [email or web address] for contact info under        Signed, Dyann Goodspeed [place], MD  12/04/2019, 7:25 AM

## 2019-12-04 NOTE — Plan of Care (Signed)
  Problem: Education: Goal: Knowledge of General Education information will improve Description: Including pain rating scale, medication(s)/side effects and non-pharmacologic comfort measures Outcome: Adequate for Discharge   Problem: Health Behavior/Discharge Planning: Goal: Ability to manage health-related needs will improve Outcome: Adequate for Discharge   Problem: Clinical Measurements: Goal: Ability to maintain clinical measurements within normal limits will improve Outcome: Adequate for Discharge Goal: Will remain free from infection Outcome: Adequate for Discharge Goal: Diagnostic test results will improve Outcome: Adequate for Discharge Goal: Respiratory complications will improve Outcome: Adequate for Discharge Goal: Cardiovascular complication will be avoided Outcome: Adequate for Discharge   Problem: Activity: Goal: Risk for activity intolerance will decrease Outcome: Adequate for Discharge   Problem: Nutrition: Goal: Adequate nutrition will be maintained Outcome: Adequate for Discharge   Problem: Coping: Goal: Level of anxiety will decrease Outcome: Adequate for Discharge   Problem: Elimination: Goal: Will not experience complications related to bowel motility Outcome: Adequate for Discharge Goal: Will not experience complications related to urinary retention Outcome: Adequate for Discharge   Problem: Pain Managment: Goal: General experience of comfort will improve Outcome: Adequate for Discharge   Problem: Skin Integrity: Goal: Risk for impaired skin integrity will decrease Outcome: Adequate for Discharge

## 2019-12-08 ENCOUNTER — Telehealth (HOSPITAL_COMMUNITY): Payer: Self-pay

## 2019-12-08 NOTE — Telephone Encounter (Signed)
Attempted to call patient in regards to Cardiac Rehab - LM on VM 

## 2019-12-08 NOTE — Telephone Encounter (Signed)
Pt insurance is active and benefits verified through [employer]. Co-pay $45.00, DED $0.00/$0.00 met, out of pocket $5,000.00/$474.20 met, co-insurance 0%. No pre-authorization required. Nikki/[employer], 12/08/19 @ 330PM, REF#[id number]  Will contact patient to see if he is interested in the Cardiac Rehab Program. If interested, patient will need to complete follow up appt. Once completed, patient will be contacted for scheduling upon review by the RN Navigator. 

## 2019-12-21 NOTE — Progress Notes (Signed)
Cardiology Office Note:    Date:  12/22/2019   ID:  Alan, Wall 11/10/1945, MRN [id number]  PCP:  Barbra Sarks, MD  Cardiologist:  Sherren Mocha, MD  Electrophysiologist:  None   Referring MD: Barbra Sarks, MD   Chief Complaint:  Hospitalization Follow-up (Inf STEMI >> unsuccessful PCI >> med Rx)    Patient Profile:    Alan Wall is a 74 y.o. male with:   Coronary artery disease   S/p inferior STEMI >> unsuccessful PCI of the PRDA and RPL1 (severe ectasia)  Ischemic CM  Echocardiogram 8/21: EF 45-50  Hyperlipidemia   OSA on CPAP  Hx of esophageal stricture s/p dilation   Bradycardia.   Prior CV studies: Echocardiogram 12/02/19 EF 45-50, inf, inf-sept, inf-lat severe HK, normal RVSF, RVSP 29.8, mild to mod MR  Cardiac catheterization 12/01/19 LAD mid 40 LCx prox ectasia  RCA severe diff ectasia; RPDA 95; RPL1 100 (thrombotic) PCI:  Unsuccessful POBA to RPDA and RPL1 (not amenable to stenting due to slow flow overall and severe ectasia   History of Present Illness:    [name] was admitted 8/9-8/12 with an inferior STEMI.  An emergent cardiac catheterization demonstrated total occlusion of the 1st PL branch of the RCA (likely distal embolization from aneurysmal disease) with severe diffuse ectasia throughout the RCA and severe stenosis at the ostium of the PDA.  There was also ectasia at the proximal LCx with non-obstructive disease.  PCI of the RCA sub branches was unsuccessful.  The vessels were not candidates for stenting due to slow overall flow and severe ectasia.  He was noted to have NSVT but was not placed on beta-blocker due to bradycardia.  An echocardiogram demonstrated an EF 45-50% and mild to mod MR.  He returns for follow up.  He is here today with his wife.  Since discharge, he has done well without chest discomfort, shortness of breath, syncope, near syncope or leg swelling.  He had several questions about diet, exercise and  medications.  We discussed all of those today.   Past Medical History:  Diagnosis Date  . Allergy    Food/cat dander/bee stings  . Coronary artery disease   . Hematoma of leg   . Hyperlipidemia   . Myocardial infarction ([place])   . S/P dilatation of esophageal stricture   . Sleep apnea     Current Medications: Current Meds  Medication Sig  . aspirin EC 81 MG tablet Take 1 tablet (81 mg total) by mouth daily. Swallow whole.  . Cholecalciferol (VITAMIN D3 PO) Take 1 tablet by mouth daily.  . fluticasone (FLONASE) 50 MCG/ACT nasal spray Place 2 sprays into both nostrils daily as needed for allergies or rhinitis.   Marland Kitchen isosorbide mononitrate (IMDUR) 30 MG 24 hr tablet Take 1 tablet (30 mg total) by mouth daily.  . melatonin 5 MG TABS Take 5-10 mg by mouth at bedtime as needed (sleep).   . metoprolol succinate (TOPROL-XL) 25 MG 24 hr tablet Take 1 tablet (25 mg total) by mouth daily.  . nitroGLYCERIN (NITROSTAT) 0.4 MG SL tablet Place 1 tablet (0.4 mg total) under the tongue every 5 (five) minutes as needed for chest pain.  Marland Kitchen nystatin cream (MYCOSTATIN) Apply 1 application topically 2 (two) times daily as needed for dry skin.  . rosuvastatin (CRESTOR) 40 MG tablet Take 1 tablet (40 mg total) by mouth daily.  . ticagrelor (BRILINTA) 90 MG TABS tablet Take 1 tablet (90 mg total) by  mouth every 12 (twelve) hours.  . zaleplon (SONATA) 5 MG capsule Take 5-10 mg by mouth at bedtime as needed for sleep.      Allergies:   Bee venom and Other   Social History   Tobacco Use  . Smoking status: Never Smoker  . Smokeless tobacco: Never Used  Vaping Use  . Vaping Use: Never used  Substance Use Topics  . Alcohol use: Yes    Alcohol/week: 2.0 standard drinks    Types: 2 Glasses of wine per week    Comment: 2 drinks per week  . Drug use: No     Family Hx: The patient's family history includes Coronary artery disease in an other family member; Heart disease in his brother and father; Prostate  cancer in his paternal uncle.  ROS   EKGs/Labs/Other Test Reviewed:    EKG:  EKG is   ordered today.  The ekg ordered today demonstrates normal sinus rhythm, heart rate 63, normal axis, inferior Q waves, T wave inversions 2, 3, aVF, V5-V6, QTC 433  Recent Labs: 12/01/2019: ALT 26 12/02/2019: Hemoglobin 13.8; Platelets 192 12/04/2019: BUN 11; Creatinine, Ser 1.10; Magnesium 2.3; Potassium 3.8; Sodium 136   Recent Lipid Panel Lab Results  Component Value Date/Time   CHOL 179 12/01/2019 03:55 PM   TRIG 76 12/01/2019 03:55 PM   HDL 60 12/01/2019 03:55 PM   CHOLHDL 3.0 12/01/2019 03:55 PM   LDLCALC 104 (H) 12/01/2019 03:55 PM   LDLDIRECT 125.2 06/12/2012 08:08 AM    Physical Exam:    VS:  BP 100/64   Pulse 63   Ht 5\' 7"  (1.702 m)   Wt 158 lb (71.7 kg)   SpO2 96%   BMI 24.75 kg/m     Wt Readings from Last 3 Encounters:  12/22/19 158 lb (71.7 kg)  12/01/19 165 lb (74.8 kg)  10/13/19 162 lb 6.4 oz (73.7 kg)     Constitutional:      Appearance: Healthy appearance. Not in distress.  Neck:     Thyroid: No thyromegaly.     Vascular: JVD normal.  Pulmonary:     Breath sounds: No wheezing. No rales.  Cardiovascular:     Normal rate. Regular rhythm. Normal S1. Normal S2.     Murmurs: There is no murmur.     Comments: R wrist without hematoma Pulses:    Intact distal pulses.  Edema:    Peripheral edema absent.  Abdominal:     Palpations: Abdomen is soft.  Skin:    General: Skin is warm and dry.  Neurological:     General: No focal deficit present.     Mental Status: Alert and oriented to person, place and time.     Cranial Nerves: Cranial nerves are intact.      ASSESSMENT & PLAN:    1. Acute inferior myocardial infarction [facility]) Status post inferior STEMI secondary to occlusion of the first PL branch of the RCA.  This was felt to be due to distal embolization from aneurysmal disease.  He also had severe disease in the PDA.  PCI was unsuccessful and medical therapy has  been continued.  He is doing well without anginal symptoms.  Prior to his heart attack, he was very active and exercise regularly.  I reviewed the results of his cardiac catheterization with him today.  He will need to remain on long-term dual antiplatelet therapy.  His insurance will not cover Ticagrelor.  Therefore, after 30 days, we will switch him to clopidogrel.  He is tolerating beta-blocker therapy.  He does want to start cardiac rehabilitation.  It sounds as though he will be scheduled sometime in early October.  -Continue aspirin, ticagrelor, rosuvastatin, metoprolol succinate  -After 30 days, he will stop ticagrelor and start clopidogrel (300 mg on day 1, then 75 mg daily)  -Follow-up in 6 weeks  -Start cardiac rehabilitation when able  2. Ischemic cardiomyopathy EF 45-50 by echocardiogram.  NYHA I.  He has no evidence of volume excess.  Continue beta-blocker therapy.  At this point, I am not certain his blood pressure will tolerate the addition of an ACE inhibitor.  He did bring in blood pressures from home and his blood pressures have been fairly stable.  At follow-up in 6 weeks, consider starting lisinopril 2.5 mg daily.  3. Hypercholesterolemia He notes previous history of myalgias related to a different statin.  He has been on pravastatin since that time and has also taken coenzyme Q10 in the past.  Continue current dose of rosuvastatin.  If he develops myalgias or arthralgias, he knows to contact us.  If he has side effects, we could attempt to keep him on half dose rosuvastatin or switch him back to pravastatin.    Dispo:  Return in about 6 weeks (around 02/02/2020) for Routine Follow Up, w/ Dr. Burt Knack, or Richardson Dopp, PA-C, in person.   Medication Adjustments/Labs and Tests Ordered: Current medicines are reviewed at length with the patient today.  Concerns regarding medicines are outlined above.  Tests Ordered: Orders Placed This Encounter  Procedures  . EKG 12-Lead    Medication Changes: Meds ordered this encounter  Medications  . clopidogrel (PLAVIX) 75 MG tablet    Sig: Take 4 tablets (300 mg total) by mouth once for 1 dose. 12 hours after last ticagrelor dose    Dispense:  4 tablet    Refill:  0  . clopidogrel (PLAVIX) 75 MG tablet    Sig: Take 1 tablet (75 mg total) by mouth daily.    Dispense:  90 tablet    Refill:  3    Signed, Richardson Dopp, PA-C  12/22/2019 9:31 AM    [facility] [address], [place], [place]  [address] Phone: [phone]; Fax: [phone]

## 2019-12-22 ENCOUNTER — Ambulatory Visit: Payer: Medicare HMO | Admitting: Physician Assistant

## 2019-12-22 ENCOUNTER — Encounter: Payer: Self-pay | Admitting: Physician Assistant

## 2019-12-22 ENCOUNTER — Other Ambulatory Visit: Payer: Self-pay

## 2019-12-22 VITALS — BP 100/64 | HR 63 | Ht 67.0 in | Wt 158.0 lb

## 2019-12-22 DIAGNOSIS — I2119 ST elevation (STEMI) myocardial infarction involving other coronary artery of inferior wall: Secondary | ICD-10-CM

## 2019-12-22 DIAGNOSIS — I255 Ischemic cardiomyopathy: Secondary | ICD-10-CM | POA: Diagnosis not present

## 2019-12-22 DIAGNOSIS — E7800 Pure hypercholesterolemia, unspecified: Secondary | ICD-10-CM

## 2019-12-22 MED ORDER — CLOPIDOGREL BISULFATE 75 MG PO TABS
300.0000 mg | ORAL_TABLET | Freq: Once | ORAL | 0 refills | Status: AC
Start: 2019-12-22 — End: 2019-12-22

## 2019-12-22 MED ORDER — CLOPIDOGREL BISULFATE 75 MG PO TABS: 75.0000 mg | ORAL_TABLET | Freq: Every day | ORAL | 3 refills | Status: AC

## 2019-12-22 NOTE — Patient Instructions (Addendum)
Medication Instructions:  Your physician has recommended you make the following change in your medication:   1) When you finish your Brilinta, start Plavix (Clopidogrel). For the first dose of Clopidogrel, you will take 4 tablets by mouth once. Then you will take 1 tablet by mouth once a day. 2) Let us know what strength CoQ10 you are taking  *If you need a refill on your cardiac medications before your next appointment, please call your pharmacy*  Lab Work: None ordered today  Testing/Procedures: None ordered today  Follow-Up: On 02/03/20 at 9:15AM with Richardson Dopp, PA-C; come fasting to this appointment we will check your cholesterol.

## 2020-01-05 ENCOUNTER — Telehealth (HOSPITAL_COMMUNITY): Payer: Self-pay | Admitting: Student-PharmD

## 2020-01-05 NOTE — Telephone Encounter (Signed)
Cardiac Rehab Medication Review by a Pharmacist  Does the patient feel that his/her medications are working for him/her?  yes  Has the patient been experiencing any side effects to the medications prescribed?  No - reports he is improving following surgery, swelling is going down  Does the patient measure his/her own blood pressure or blood glucose at home?  Checks blood pressure twice daily (when he first wakes up and right before bedtime) and keeps log; this AM it was 106/71, HR 53; yesterday evening it was 106/66, HR 67. Reports no dizziness.   Does the patient have any problems obtaining medications due to transportation or finances?   no  Understanding of regimen: good Understanding of indications: good Potential of compliance: good    Pharmacist Intervention: No medication interventions at this time. Continue to monitor heart rate on beta blocker to ensure it does not drop too low.    Alan Wall, PharmD PGY1 Pharmacy Resident 01/05/2020 1:14 PM

## 2020-01-06 ENCOUNTER — Encounter (HOSPITAL_COMMUNITY)
Admission: RE | Admit: 2020-01-06 | Discharge: 2020-01-06 | Disposition: A | Discharge status: Home / Self Care | Payer: Medicare HMO | Source: Ambulatory Visit | Attending: Cardiovascular Disease | Admitting: Cardiovascular Disease

## 2020-01-06 ENCOUNTER — Telehealth (HOSPITAL_COMMUNITY): Payer: Self-pay | Admitting: *Deleted

## 2020-01-06 DIAGNOSIS — I2111 ST elevation (STEMI) myocardial infarction involving right coronary artery: Secondary | ICD-10-CM | POA: Insufficient documentation

## 2020-01-06 NOTE — Telephone Encounter (Signed)
Spoke with patient. Confirmed appointment. Completed health history.Barnet Pall, RN,BSN 01/06/2020 12:10 PM

## 2020-01-08 ENCOUNTER — Encounter (HOSPITAL_COMMUNITY): Payer: Self-pay

## 2020-01-08 ENCOUNTER — Other Ambulatory Visit: Payer: Self-pay

## 2020-01-08 ENCOUNTER — Encounter (HOSPITAL_COMMUNITY)
Admission: RE | Admit: 2020-01-08 | Discharge: 2020-01-08 | Disposition: A | Discharge status: Home / Self Care | Payer: Medicare HMO | Source: Ambulatory Visit | Attending: Cardiovascular Disease | Admitting: Cardiovascular Disease

## 2020-01-08 VITALS — BP 104/64 | HR 72 | Ht 68.0 in | Wt 154.5 lb

## 2020-01-08 DIAGNOSIS — I2111 ST elevation (STEMI) myocardial infarction involving right coronary artery: Secondary | ICD-10-CM

## 2020-01-08 NOTE — Assessment & Plan Note (Signed)
Benefits from CPAP with adequate compliance and good control Plan- Try to sleep more and use CPAP more at night, sleep hygiene, auto 12-16

## 2020-01-08 NOTE — Progress Notes (Signed)
Cardiac Individual Treatment Plan  Patient Details  Name: Alan Wall MRN: [id number] Date of Birth: [birth year] Referring Provider:     CARDIAC REHAB PHASE II ORIENTATION from 01/08/2020 in [facility]  Referring Provider Sherren Mocha M.D.      Initial Encounter Date:    CARDIAC REHAB PHASE II ORIENTATION from 01/08/2020 in [facility]  Date 01/08/20      Visit Diagnosis: STEMI 12/01/19 Unsuceesful PCI to RCA, Medical Treatment  Patient's Home Medications on Admission:  Current Outpatient Medications:  .  aspirin EC 81 MG tablet, Take 1 tablet (81 mg total) by mouth daily. Swallow whole., Disp: 150 tablet, Rfl: 2 .  Cholecalciferol (VITAMIN D3 PO), Take 1 tablet by mouth daily., Disp: , Rfl:  .  clopidogrel (PLAVIX) 75 MG tablet, Take 1 tablet (75 mg total) by mouth daily., Disp: 90 tablet, Rfl: 3 .  fluticasone (FLONASE) 50 MCG/ACT nasal spray, Place 2 sprays into both nostrils daily as needed for allergies or rhinitis. , Disp: , Rfl:  .  isosorbide mononitrate (IMDUR) 30 MG 24 hr tablet, Take 1 tablet (30 mg total) by mouth daily., Disp: 90 tablet, Rfl: 3 .  melatonin 5 MG TABS, Take 5-10 mg by mouth at bedtime as needed (sleep). , Disp: , Rfl:  .  metoprolol succinate (TOPROL-XL) 25 MG 24 hr tablet, Take 1 tablet (25 mg total) by mouth daily., Disp: 90 tablet, Rfl: 3 .  nitroGLYCERIN (NITROSTAT) 0.4 MG SL tablet, Place 1 tablet (0.4 mg total) under the tongue every 5 (five) minutes as needed for chest pain., Disp: 25 tablet, Rfl: 2 .  nystatin cream (MYCOSTATIN), Apply 1 application topically 2 (two) times daily as needed for dry skin., Disp: , Rfl:  .  rosuvastatin (CRESTOR) 40 MG tablet, Take 1 tablet (40 mg total) by mouth daily., Disp: 90 tablet, Rfl: 3 .  ticagrelor (BRILINTA) 90 MG TABS tablet, Take 1 tablet (90 mg total) by mouth every 12 (twelve) hours., Disp: 180 tablet, Rfl: 3 .  zaleplon (SONATA) 5 MG  capsule, Take 5-10 mg by mouth at bedtime as needed for sleep. , Disp: , Rfl:   Past Medical History: Past Medical History:  Diagnosis Date  . Allergy    Food/cat dander/bee stings  . Coronary artery disease   . Hematoma of leg   . Hyperlipidemia   . Myocardial infarction ([place])   . S/P dilatation of esophageal stricture   . Sleep apnea     Tobacco Use: Social History   Tobacco Use  Smoking Status Never Smoker  Smokeless Tobacco Never Used    Labs: Recent Review Flowsheet Data    Labs for ITP Cardiac and Pulmonary Rehab Latest Ref Rng & Units 01/26/2015 12/29/2015 12/31/2015 03/21/2016 12/01/2019   Cholestrol 0 - 200 mg/dL 147 - - 141 179   LDLCALC 0 - 99 mg/dL 76 - - 74 104(H)   LDLDIRECT mg/dL - - - - -   HDL >40 mg/dL 58.40 - - 52.90 60   Trlycerides <150 mg/dL 64.0 - - 72.0 76   Hemoglobin A1c 4.8 - 5.6 % - - 6.0 - 5.9(H)   TCO2 0 - 100 mmol/L - 24 - - -      Capillary Blood Glucose: No results found for: GLUCAP   Exercise Target Goals: Exercise Program Goal: Individual exercise prescription set using results from initial 6 min walk test and THRR while considering  patient's activity barriers and safety.  Exercise Prescription Goal: Starting with aerobic activity 30 plus minutes a day, 3 days per week for initial exercise prescription. Provide home exercise prescription and guidelines that participant acknowledges understanding prior to discharge.  Activity Barriers & Risk Stratification:  Activity Barriers & Cardiac Risk Stratification - 01/08/20 0944      Activity Barriers & Cardiac Risk Stratification   Activity Barriers Back Problems;Other (comment)    Comments dizziness with postion change    Cardiac Risk Stratification High           6 Minute Walk:  6 Minute Walk    Row Name 01/08/20 0837         6 Minute Walk   Phase Initial     Distance 1668 feet     Walk Time 6 minutes     # of Rest Breaks 0     MPH 3.16     METS 3.49     RPE 12      Perceived Dyspnea  0     VO2 Peak 12.21     Symptoms No     Resting HR 72 bpm     Resting BP 104/64     Resting Oxygen Saturation  97 %     Exercise Oxygen Saturation  during 6 min walk 97 %     Max Ex. HR 96 bpm     Max Ex. BP 116/72     2 Minute Post BP 110/70            Oxygen Initial Assessment:   Oxygen Re-Evaluation:   Oxygen Discharge (Final Oxygen Re-Evaluation):   Initial Exercise Prescription:  Initial Exercise Prescription - 01/08/20 0900      Date of Initial Exercise RX and Referring Provider   Date 01/08/20    Referring Provider Sherren Mocha M.D.    Expected Discharge Date 03/05/20      Recumbant Bike   Level 2    Watts 30    Minutes 15    METs 2.5      Arm Ergometer   Level 2    Minutes 15    METs 2      Prescription Details   Frequency (times per week) 3    Duration Progress to 30 minutes of continuous aerobic without signs/symptoms of physical distress      Intensity   THRR 40-80% of Max Heartrate 59-118    Ratings of Perceived Exertion 11-13    Perceived Dyspnea 0-4      Progression   Progression Continue progressive overload as per policy without signs/symptoms or physical distress.      Resistance Training   Training Prescription Yes    Weight 3 lbs    Reps 10-15           Perform Capillary Blood Glucose checks as needed.  Exercise Prescription Changes:   Exercise Comments:   Exercise Goals and Review:   Exercise Goals    Row Name 01/08/20 0945             Exercise Goals   Increase Physical Activity Yes       Intervention Provide advice, education, support and counseling about physical activity/exercise needs.;Develop an individualized exercise prescription for aerobic and resistive training based on initial evaluation findings, risk stratification, comorbidities and participant's personal goals.       Expected Outcomes Short Term: Attend rehab on a regular basis to increase amount of physical activity.;Long  Term: Add in home exercise to make exercise part of  routine and to increase amount of physical activity.;Long Term: Exercising regularly at least 3-5 days a week.       Increase Strength and Stamina Yes       Intervention Provide advice, education, support and counseling about physical activity/exercise needs.;Develop an individualized exercise prescription for aerobic and resistive training based on initial evaluation findings, risk stratification, comorbidities and participant's personal goals.       Expected Outcomes Short Term: Increase workloads from initial exercise prescription for resistance, speed, and METs.;Short Term: Perform resistance training exercises routinely during rehab and add in resistance training at home;Long Term: Improve cardiorespiratory fitness, muscular endurance and strength as measured by increased METs and functional capacity (6MWT)       Able to understand and use rate of perceived exertion (RPE) scale Yes       Intervention Provide education and explanation on how to use RPE scale       Expected Outcomes Short Term: Able to use RPE daily in rehab to express subjective intensity level;Long Term:  Able to use RPE to guide intensity level when exercising independently       Knowledge and understanding of Target Heart Rate Range (THRR) Yes       Intervention Provide education and explanation of THRR including how the numbers were predicted and where they are located for reference       Expected Outcomes Short Term: Able to state/look up THRR;Short Term: Able to use daily as guideline for intensity in rehab;Long Term: Able to use THRR to govern intensity when exercising independently       Able to check pulse independently Yes       Intervention Provide education and demonstration on how to check pulse in carotid and radial arteries.;Review the importance of being able to check your own pulse for safety during independent exercise       Expected Outcomes Short Term: Able to  explain why pulse checking is important during independent exercise;Long Term: Able to check pulse independently and accurately       Understanding of Exercise Prescription Yes       Intervention Provide education, explanation, and written materials on patient's individual exercise prescription       Expected Outcomes Short Term: Able to explain program exercise prescription;Long Term: Able to explain home exercise prescription to exercise independently              Exercise Goals Re-Evaluation :    Discharge Exercise Prescription (Final Exercise Prescription Changes):   Nutrition:  Target Goals: Understanding of nutrition guidelines, daily intake of sodium 1500mg , cholesterol 200mg , calories 30% from fat and 7% or less from saturated fats, daily to have 5 or more servings of fruits and vegetables.  Biometrics:  Pre Biometrics - 01/08/20 0946      Pre Biometrics   Waist Circumference 34.75 inches    Hip Circumference 41 inches    Waist to Hip Ratio 0.85 %    Triceps Skinfold 8 mm    % Body Fat 21.2 %    Grip Strength 46 kg    Flexibility 19.5 in    Single Leg Stand 6.25 seconds   Moderate Risk for fall           Nutrition Therapy Plan and Nutrition Goals:   Nutrition Assessments:   Nutrition Goals Re-Evaluation:   Nutrition Goals Discharge (Final Nutrition Goals Re-Evaluation):   Psychosocial: Target Goals: Acknowledge presence or absence of significant depression and/or stress, maximize coping skills, provide positive support  system. Participant is able to verbalize types and ability to use techniques and skills needed for reducing stress and depression.  Initial Review & Psychosocial Screening:  Initial Psych Review & Screening - 01/08/20 1154      Initial Review   Current issues with Current Depression;Current Stress Concerns    Source of Stress Concerns Chronic Illness    Comments Depaul says that he has been depressed due to his recent hospitalization        [place]? Yes   Janard has his wife for support     Barriers   Psychosocial barriers to participate in program The patient should benefit from training in stress management and relaxation.      Screening Interventions   Interventions Encouraged to exercise;Provide feedback about the scores to participant;To provide support and resources with identified psychosocial needs    Expected Outcomes Short Term goal: Utilizing psychosocial counselor, staff and physician to assist with identification of specific Stressors or current issues interfering with healing process. Setting desired goal for each stressor or current issue identified.;Long Term Goal: Stressors or current issues are controlled or eliminated.;Short Term goal: Identification and review with participant of any Quality of Life or Depression concerns found by scoring the questionnaire.;Long Term goal: The participant improves quality of Life and PHQ9 Scores as seen by post scores and/or verbalization of changes           Quality of Life Scores:  Quality of Life - 01/08/20 0939      Quality of Life   Select Quality of Life      Quality of Life Scores   Health/Function Pre 16.29 %    Socioeconomic Pre 20.36 %    Psych/Spiritual Pre 20.93 %    Family Pre 19.25 %    GLOBAL Pre 18.56 %          Scores of 19 and below usually indicate a poorer quality of life in these areas.  A difference of  2-3 points is a clinically meaningful difference.  A difference of 2-3 points in the total score of the Quality of Life Index has been associated with significant improvement in overall quality of life, self-image, physical symptoms, and general health in studies assessing change in quality of life.  PHQ-9: Recent Review Flowsheet Data    Depression screen [facility] 2/9 01/08/2020 01/25/2015   Decreased Interest 0 0   Down, Depressed, Hopeless 1  0   PHQ - 2 Score 1 0     Interpretation of Total Score  Total  Score Depression Severity:  1-4 = Minimal depression, 5-9 = Mild depression, 10-14 = Moderate depression, 15-19 = Moderately severe depression, 20-27 = Severe depression   Psychosocial Evaluation and Intervention:   Psychosocial Re-Evaluation:   Psychosocial Discharge (Final Psychosocial Re-Evaluation):   Vocational Rehabilitation: Provide vocational rehab assistance to qualifying candidates.   Vocational Rehab Evaluation & Intervention:  Vocational Rehab - 01/08/20 1157      Initial Vocational Rehab Evaluation & Intervention   Assessment shows need for Vocational Rehabilitation No   Darrow is retired and does not need vocatinal rehab at this time          Education: Education Goals: Education classes will be provided on a weekly basis, covering required topics. Participant will state understanding/return demonstration of topics presented.  Learning Barriers/Preferences:  Learning Barriers/Preferences - 01/08/20 0940      Learning Barriers/Preferences   Learning Barriers Sight   Wears glasses  Learning Preferences Audio;Computer/Internet;Group Instruction;Individual Instruction;Pictoral;Skilled Demonstration;Verbal Instruction;Video;Written Material           Education Topics: Hypertension, Hypertension Reduction -Define heart disease and high blood pressure. Discus how high blood pressure affects the body and ways to reduce high blood pressure.   Exercise and Your Heart -Discuss why it is important to exercise, the FITT principles of exercise, normal and abnormal responses to exercise, and how to exercise safely.   Angina -Discuss definition of angina, causes of angina, treatment of angina, and how to decrease risk of having angina.   Cardiac Medications -Review what the following cardiac medications are used for, how they affect the body, and side effects that may occur when taking the medications.  Medications include Aspirin, Beta blockers, calcium channel  blockers, ACE Inhibitors, angiotensin receptor blockers, diuretics, digoxin, and antihyperlipidemics.   Congestive Heart Failure -Discuss the definition of CHF, how to live with CHF, the signs and symptoms of CHF, and how keep track of weight and sodium intake.   Heart Disease and Intimacy -Discus the effect sexual activity has on the heart, how changes occur during intimacy as we age, and safety during sexual activity.   Smoking Cessation / COPD -Discuss different methods to quit smoking, the health benefits of quitting smoking, and the definition of COPD.   Nutrition I: Fats -Discuss the types of cholesterol, what cholesterol does to the heart, and how cholesterol levels can be controlled.   Nutrition II: Labels -Discuss the different components of food labels and how to read food label   Heart Parts/Heart Disease and PAD -Discuss the anatomy of the heart, the pathway of blood circulation through the heart, and these are affected by heart disease.   Stress I: Signs and Symptoms -Discuss the causes of stress, how stress may lead to anxiety and depression, and ways to limit stress.   Stress II: Relaxation -Discuss different types of relaxation techniques to limit stress.   Warning Signs of Stroke / TIA -Discuss definition of a stroke, what the signs and symptoms are of a stroke, and how to identify when someone is having stroke.   Knowledge Questionnaire Score:  Knowledge Questionnaire Score - 01/08/20 0942      Knowledge Questionnaire Score   Pre Score 19/24           Core Components/Risk Factors/Patient Goals at Admission:  Personal Goals and Risk Factors at Admission - 01/08/20 1200      Core Components/Risk Factors/Patient Goals on Admission    Weight Management Weight Maintenance;Yes    Intervention Weight Management: Develop a combined nutrition and exercise program designed to reach desired caloric intake, while maintaining appropriate intake of nutrient  and fiber, sodium and fats, and appropriate energy expenditure required for the weight goal.;Weight Management: Provide education and appropriate resources to help participant work on and attain dietary goals.    Expected Outcomes Weight Maintenance: Understanding of the daily nutrition guidelines, which includes 25-35% calories from fat, 7% or less cal from saturated fats, less than 200mg  cholesterol, less than 1.5gm of sodium, & 5 or more servings of fruits and vegetables daily;Short Term: Continue to assess and modify interventions until short term weight is achieved;Long Term: Adherence to nutrition and physical activity/exercise program aimed toward attainment of established weight goal;Understanding of distribution of calorie intake throughout the day with the consumption of 4-5 meals/snacks;Understanding recommendations for meals to include 15-35% energy as protein, 25-35% energy from fat, 35-60% energy from carbohydrates, less than 200mg  of dietary cholesterol, 20-35 gm of  total fiber daily    Lipids Yes    Intervention Provide education and support for participant on nutrition & aerobic/resistive exercise along with prescribed medications to achieve LDL 70mg , HDL >40mg .    Expected Outcomes Short Term: Participant states understanding of desired cholesterol values and is compliant with medications prescribed. Participant is following exercise prescription and nutrition guidelines.;Long Term: Cholesterol controlled with medications as prescribed, with individualized exercise RX and with personalized nutrition plan. Value goals: LDL < 70mg , HDL > 40 mg.    Stress Yes    Intervention Offer individual and/or small group education and counseling on adjustment to heart disease, stress management and health-related lifestyle change. Teach and support self-help strategies.;Refer participants experiencing significant psychosocial distress to appropriate mental health specialists for further evaluation and  treatment. When possible, include family members and significant others in education/counseling sessions.    Expected Outcomes Short Term: Participant demonstrates changes in health-related behavior, relaxation and other stress management skills, ability to obtain effective social support, and compliance with psychotropic medications if prescribed.;Long Term: Emotional wellbeing is indicated by absence of clinically significant psychosocial distress or social isolation.           Core Components/Risk Factors/Patient Goals Review:    Core Components/Risk Factors/Patient Goals at Discharge (Final Review):    ITP Comments:  ITP Comments    Row Name 01/08/20 0927           ITP Comments Dr Fransico Him MD, Medical Director              Comments: [name] attended orientation on 01/08/2020 to review rules and guidelines for program.  Completed 6 minute walk test, Intitial ITP, and exercise prescription.  VSS. Telemetry-Sinus Rhythm with an inverted t wave this has been previously documented.  Asymptomatic. Safety measures and social distancing in place per CDC guidelines.Barnet Pall, RN,BSN 01/08/2020 12:09 PM

## 2020-01-08 NOTE — Assessment & Plan Note (Signed)
Has had CBT training at [facility]

## 2020-01-12 ENCOUNTER — Other Ambulatory Visit: Payer: Self-pay

## 2020-01-12 ENCOUNTER — Encounter (HOSPITAL_COMMUNITY)
Admission: RE | Admit: 2020-01-12 | Discharge: 2020-01-12 | Disposition: A | Discharge status: Home / Self Care | Payer: Medicare HMO | Source: Ambulatory Visit | Attending: Cardiovascular Disease | Admitting: Cardiovascular Disease

## 2020-01-12 DIAGNOSIS — I2111 ST elevation (STEMI) myocardial infarction involving right coronary artery: Secondary | ICD-10-CM

## 2020-01-12 NOTE — Progress Notes (Signed)
Daily Session Note  Patient Details  Name: Alan Wall MRN: [id number] Date of Birth: 1945-07-30 Referring Provider:     CARDIAC REHAB PHASE II ORIENTATION from 01/08/2020 in [facility]  Referring Provider Sherren Mocha M.D.      Encounter Date: 01/12/2020  Check In:  Session Check In - 01/12/20 0909      Check-In   Supervising physician immediately available to respond to emergencies [facility] immediately available    Physician(s) Dr Lonny Prude    Location [facility]    Staff Present Leda Roys, MS, ACSM-CEP, Exercise Physiologist;Tyara Carol Ada, MS,ACSM CEP, Exercise Physiologist;Portia Rollene Rotunda, RN, Deland Pretty, MS, ACSM CEP, Exercise Physiologist;Anysa Tacey, RN, BSN    Virtual Visit No    Medication changes reported     No    Fall or balance concerns reported    No    Tobacco Cessation No Change    Warm-up and Cool-down Performed on first and last piece of equipment    Resistance Training Performed Yes    VAD Patient? No    PAD/SET Patient? No      Pain Assessment   Currently in Pain? No/denies    Multiple Pain Sites No           Capillary Blood Glucose: No results found for this or any previous visit (from the past 24 hour(s)).   Exercise Prescription Changes - 01/12/20 1000      Response to Exercise   Blood Pressure (Admit) 106/64    Blood Pressure (Exercise) 128/70    Blood Pressure (Exit) 104/60    Heart Rate (Admit) 72 bpm    Heart Rate (Exercise) 93 bpm    Heart Rate (Exit) 70 bpm    Rating of Perceived Exertion (Exercise) 11    Perceived Dyspnea (Exercise) 0    Symptoms None    Comments Pt's first day of exercise    Duration Progress to 10 minutes continuous walking  at current work load and total walking time to 30-45 min    Intensity THRR unchanged      Progression   Progression Continue to progress workloads to maintain intensity without signs/symptoms of physical distress.     Average METs 3.3      Resistance Training   Training Prescription Yes    Weight 4lbs    Reps 10-15    Time 10 Minutes      Recumbant Bike   Level 3    Watts 45    Minutes 15    METs 4.5      Arm Ergometer   Level 2    Minutes 15    METs 2.12           Social History   Tobacco Use  Smoking Status Never Smoker  Smokeless Tobacco Never Used    Goals Met:  Exercise tolerated well No report of cardiac concerns or symptoms Strength training completed today  Goals Unmet:  Not Applicable  Comments: [facility] started cardiac rehab today.  Pt tolerated light exercise without difficulty. VSS, telemetry-Sinus Rhythm inverted t wave, asymptomatic.  Medication list reconciled. Pt denies barriers to medicaiton compliance.  PSYCHOSOCIAL ASSESSMENT:  PHQ-1. Pt exhibits positive coping skills, hopeful outlook with supportive family. No psychosocial needs identified at this time, no psychosocial interventions necessary.    Pt enjoys reading music and art.   Pt oriented to exercise equipment and routine.    Understanding verbalized.Alan Pall, RN,BSN 01/12/2020 10:19 AM   Dr.  Fransico Him is Market researcher for Cardiac Rehab at [facility].

## 2020-01-14 ENCOUNTER — Other Ambulatory Visit: Payer: Self-pay

## 2020-01-14 ENCOUNTER — Encounter (HOSPITAL_COMMUNITY)
Admission: RE | Admit: 2020-01-14 | Discharge: 2020-01-14 | Disposition: A | Discharge status: Home / Self Care | Payer: Medicare HMO | Source: Ambulatory Visit | Attending: Cardiovascular Disease | Admitting: Cardiovascular Disease

## 2020-01-14 DIAGNOSIS — I2111 ST elevation (STEMI) myocardial infarction involving right coronary artery: Secondary | ICD-10-CM

## 2020-01-14 NOTE — Progress Notes (Signed)
Caydn Justen 74 y.o. male Nutrition Note  Visit Diagnosis: STEMI 12/01/19 Unsuceesful PCI to RCA, Medical Treatment  Past Medical History:  Diagnosis Date  . Allergy    Food/cat dander/bee stings  . Coronary artery disease   . Hematoma of leg   . Hyperlipidemia   . Myocardial infarction ([place])   . S/P dilatation of esophageal stricture   . Sleep apnea      Medications reviewed.   Current Outpatient Medications:  .  aspirin EC 81 MG tablet, Take 1 tablet (81 mg total) by mouth daily. Swallow whole., Disp: 150 tablet, Rfl: 2 .  Cholecalciferol (VITAMIN D3 PO), Take 1 tablet by mouth daily., Disp: , Rfl:  .  clopidogrel (PLAVIX) 75 MG tablet, Take 1 tablet (75 mg total) by mouth daily., Disp: 90 tablet, Rfl: 3 .  fluticasone (FLONASE) 50 MCG/ACT nasal spray, Place 2 sprays into both nostrils daily as needed for allergies or rhinitis. , Disp: , Rfl:  .  isosorbide mononitrate (IMDUR) 30 MG 24 hr tablet, Take 1 tablet (30 mg total) by mouth daily., Disp: 90 tablet, Rfl: 3 .  melatonin 5 MG TABS, Take 5-10 mg by mouth at bedtime as needed (sleep). , Disp: , Rfl:  .  metoprolol succinate (TOPROL-XL) 25 MG 24 hr tablet, Take 1 tablet (25 mg total) by mouth daily., Disp: 90 tablet, Rfl: 3 .  nitroGLYCERIN (NITROSTAT) 0.4 MG SL tablet, Place 1 tablet (0.4 mg total) under the tongue every 5 (five) minutes as needed for chest pain., Disp: 25 tablet, Rfl: 2 .  nystatin cream (MYCOSTATIN), Apply 1 application topically 2 (two) times daily as needed for dry skin., Disp: , Rfl:  .  rosuvastatin (CRESTOR) 40 MG tablet, Take 1 tablet (40 mg total) by mouth daily., Disp: 90 tablet, Rfl: 3 .  ticagrelor (BRILINTA) 90 MG TABS tablet, Take 1 tablet (90 mg total) by mouth every 12 (twelve) hours., Disp: 180 tablet, Rfl: 3 .  zaleplon (SONATA) 5 MG capsule, Take 5-10 mg by mouth at bedtime as needed for sleep. , Disp: , Rfl:    Ht Readings from Last 1 Encounters:  01/08/20 5\' 8"  (1.727 m)     Wt  Readings from Last 3 Encounters:  01/08/20 154 lb 8.7 oz (70.1 kg)  12/22/19 158 lb (71.7 kg)  12/01/19 165 lb (74.8 kg)     There is no height or weight on file to calculate BMI.   Social History   Tobacco Use  Smoking Status Never Smoker  Smokeless Tobacco Never Used     Lab Results  Component Value Date   CHOL 179 12/01/2019   Lab Results  Component Value Date   HDL 60 12/01/2019   Lab Results  Component Value Date   LDLCALC 104 (H) 12/01/2019   Lab Results  Component Value Date   TRIG 76 12/01/2019     Lab Results  Component Value Date   HGBA1C 5.9 (H) 12/01/2019     CBG (last 3)  No results for input(s): GLUCAP in the last 72 hours.   Nutrition Note  Spoke with pt. Nutrition Plan and Nutrition Survey goals reviewed with pt. Pt is following a Heart Healthy diet. He has started following the [employer]. He says it is sustainable and he enjoys it.   Pt has Pre-diabetes. Last A1c indicates blood glucose well-controlled. Reviewed lifestyle modifications and what A1C ranges are for diabetes.  Pt and wife cook at home. They eat out 1-2 per  week. They read labels to limit sodium and sugar. He does cook with some salt. No sugary drinks.  Pt expressed understanding of the information reviewed.  Nutrition Diagnosis ? Food-and nutrition-related knowledge deficit related to lack of exposure to information as related to diagnosis of: ? CVD ? Pre-diabetes  Nutrition Intervention ? Pt's individual nutrition plan reviewed with pt. ? Benefits of adopting Heart Healthy diet discussed when [employer] reviewed.   ? Continue client-centered nutrition education by RD, as part of interdisciplinary care.  Goal(s) ? Pt to build a healthy plate including vegetables, fruits, whole grains, and low-fat dairy products in a heart healthy meal plan. ? Pt able to name foods that affect blood glucose  Plan:   Will provide client-centered nutrition education as part of  interdisciplinary care  Monitor and evaluate progress toward nutrition goal with team.   Michaele Offer, MS, RDN, LDN

## 2020-01-14 NOTE — Progress Notes (Signed)
QUALITY OF LIFE SCORE REVIEW  [name] completed Quality of Life survey as a participant in Cardiac Rehab. Scores 21.0 or below are considered low. Pt score very low in several areas Overall 18.56, Health and Function 16.29, socioeconomic 20.36, physiological and spiritual 20.93, family 19.25. Patient quality of life slightly altered by physical constraints which limits ability to perform as prior to recent cardiac illness. Reviewed quality of life questionnaire with Alan Wall. Patient admits to having some depression since his recent hospitalization .  Offered emotional support and reassurance.  Will continue to monitor and intervene as necessary.  Will forward to patient's primary care physician Dr Corliss Marcus at [facility] integrated medicine in [place] he has a trusted friend that he plans to talk with in the near future. [name] says he plans to participate in cardiac rehab for one month and wants to return to exercising at the [employer] [facility] so that he can exercise with his wife and his trainer. Barnet Pall, RN,BSN 01/14/2020 2:09 PM

## 2020-01-16 ENCOUNTER — Other Ambulatory Visit: Payer: Self-pay

## 2020-01-16 ENCOUNTER — Encounter (HOSPITAL_COMMUNITY)
Admission: RE | Admit: 2020-01-16 | Discharge: 2020-01-16 | Disposition: A | Discharge status: Home / Self Care | Payer: Medicare HMO | Source: Ambulatory Visit | Attending: Cardiovascular Disease | Admitting: Cardiovascular Disease

## 2020-01-16 DIAGNOSIS — I2111 ST elevation (STEMI) myocardial infarction involving right coronary artery: Secondary | ICD-10-CM

## 2020-01-19 ENCOUNTER — Other Ambulatory Visit: Payer: Self-pay

## 2020-01-19 ENCOUNTER — Encounter (HOSPITAL_COMMUNITY)
Admission: RE | Admit: 2020-01-19 | Discharge: 2020-01-19 | Disposition: A | Discharge status: Home / Self Care | Payer: Medicare HMO | Source: Ambulatory Visit | Attending: Cardiovascular Disease | Admitting: Cardiovascular Disease

## 2020-01-19 DIAGNOSIS — I2111 ST elevation (STEMI) myocardial infarction involving right coronary artery: Secondary | ICD-10-CM

## 2020-01-19 NOTE — Progress Notes (Signed)
I have reviewed a Home Exercise Prescription with Alan Wall . Alan Wall is currently exercising at home. The patient was advised to continue to walk/ return to local gym 2-3 days a week for 30-45 minutes.  Alan Wall and I discussed how to progress their exercise prescription.  The patient stated that their goals were to return to his gym full time. The patient stated that they understand the exercise prescription.  We reviewed exercise guidelines, target heart rate during exercise, RPE Scale, weather conditions, NTG use, endpoints for exercise, warmup and cool down. Patient is encouraged to come to me with any questions. I will continue to follow up with the patient to assist them with progression and safety.    Alan Lair MS, CEP CSCS 10:37 AM 01/19/2020

## 2020-01-19 NOTE — Progress Notes (Signed)
Nutrition note - Follow up  Spoke with pt. Reviewed recipe modification. He brought stack of recipes - reviewed and provided feedback on heart healthy changes. Emphasized importance of vegetable and whole grain intake and reduced sodium/sugar intake. Reviewed labels and provided written feedback. Pt verbalized understanding. Overall pt is following a heart healthy diet.  Will continue to monitor pt during cardiac rehab.  Michaele Offer, MS, RDN, LDN

## 2020-01-20 NOTE — Progress Notes (Signed)
Cardiac Individual Treatment Plan  Patient Details  Name: Alan Wall MRN: [id number] Date of Birth: 01/14/1946 Referring Provider:     CARDIAC REHAB PHASE II ORIENTATION from 01/08/2020 in [place]  Referring Provider Sherren Mocha M.D.      Initial Encounter Date:    CARDIAC REHAB PHASE II ORIENTATION from 01/08/2020 in [place]  Date 01/08/20      Visit Diagnosis: STEMI 12/01/19 Unsuceesful PCI to RCA, Medical Treatment  Patient's Home Medications on Admission:  Current Outpatient Medications:  .  aspirin EC 81 MG tablet, Take 1 tablet (81 mg total) by mouth daily. Swallow whole., Disp: 150 tablet, Rfl: 2 .  Cholecalciferol (VITAMIN D3 PO), Take 1 tablet by mouth daily., Disp: , Rfl:  .  clopidogrel (PLAVIX) 75 MG tablet, Take 1 tablet (75 mg total) by mouth daily., Disp: 90 tablet, Rfl: 3 .  fluticasone (FLONASE) 50 MCG/ACT nasal spray, Place 2 sprays into both nostrils daily as needed for allergies or rhinitis. , Disp: , Rfl:  .  isosorbide mononitrate (IMDUR) 30 MG 24 hr tablet, Take 1 tablet (30 mg total) by mouth daily., Disp: 90 tablet, Rfl: 3 .  melatonin 5 MG TABS, Take 5-10 mg by mouth at bedtime as needed (sleep). , Disp: , Rfl:  .  metoprolol succinate (TOPROL-XL) 25 MG 24 hr tablet, Take 1 tablet (25 mg total) by mouth daily., Disp: 90 tablet, Rfl: 3 .  nitroGLYCERIN (NITROSTAT) 0.4 MG SL tablet, Place 1 tablet (0.4 mg total) under the tongue every 5 (five) minutes as needed for chest pain., Disp: 25 tablet, Rfl: 2 .  nystatin cream (MYCOSTATIN), Apply 1 application topically 2 (two) times daily as needed for dry skin., Disp: , Rfl:  .  rosuvastatin (CRESTOR) 40 MG tablet, Take 1 tablet (40 mg total) by mouth daily., Disp: 90 tablet, Rfl: 3 .  ticagrelor (BRILINTA) 90 MG TABS tablet, Take 1 tablet (90 mg total) by mouth every 12 (twelve) hours., Disp: 180 tablet, Rfl: 3 .  zaleplon (SONATA) 5 MG  capsule, Take 5-10 mg by mouth at bedtime as needed for sleep. , Disp: , Rfl:   Past Medical History: Past Medical History:  Diagnosis Date  . Allergy    Food/cat dander/bee stings  . Coronary artery disease   . Hematoma of leg   . Hyperlipidemia   . Myocardial infarction ([place])   . S/P dilatation of esophageal stricture   . Sleep apnea     Tobacco Use: Social History   Tobacco Use  Smoking Status Never Smoker  Smokeless Tobacco Never Used    Labs: Recent Review Flowsheet Data    Labs for ITP Cardiac and Pulmonary Rehab Latest Ref Rng & Units 01/26/2015 12/29/2015 12/31/2015 03/21/2016 12/01/2019   Cholestrol 0 - 200 mg/dL 147 - - 141 179   LDLCALC 0 - 99 mg/dL 76 - - 74 104(H)   LDLDIRECT mg/dL - - - - -   HDL >40 mg/dL 58.40 - - 52.90 60   Trlycerides <150 mg/dL 64.0 - - 72.0 76   Hemoglobin A1c 4.8 - 5.6 % - - 6.0 - 5.9(H)   TCO2 0 - 100 mmol/L - 24 - - -      Capillary Blood Glucose: No results found for: GLUCAP   Exercise Target Goals: Exercise Program Goal: Individual exercise prescription set using results from initial 6 min walk test and THRR while considering  patient's activity barriers and safety.  Exercise Prescription Goal: Starting with aerobic activity 30 plus minutes a day, 3 days per week for initial exercise prescription. Provide home exercise prescription and guidelines that participant acknowledges understanding prior to discharge.  Activity Barriers & Risk Stratification:  Activity Barriers & Cardiac Risk Stratification - 01/08/20 0944      Activity Barriers & Cardiac Risk Stratification   Activity Barriers Back Problems;Other (comment)    Comments dizziness with postion change    Cardiac Risk Stratification High           6 Minute Walk:  6 Minute Walk    Row Name 01/08/20 0837         6 Minute Walk   Phase Initial     Distance 1668 feet     Walk Time 6 minutes     # of Rest Breaks 0     MPH 3.16     METS 3.49     RPE 12      Perceived Dyspnea  0     VO2 Peak 12.21     Symptoms No     Resting HR 72 bpm     Resting BP 104/64     Resting Oxygen Saturation  97 %     Exercise Oxygen Saturation  during 6 min walk 97 %     Max Ex. HR 96 bpm     Max Ex. BP 116/72     2 Minute Post BP 110/70            Oxygen Initial Assessment:   Oxygen Re-Evaluation:   Oxygen Discharge (Final Oxygen Re-Evaluation):   Initial Exercise Prescription:  Initial Exercise Prescription - 01/08/20 0900      Date of Initial Exercise RX and Referring Provider   Date 01/08/20    Referring Provider Sherren Mocha M.D.    Expected Discharge Date 03/05/20      Recumbant Bike   Level 2    Watts 30    Minutes 15    METs 2.5      Arm Ergometer   Level 2    Minutes 15    METs 2      Prescription Details   Frequency (times per week) 3    Duration Progress to 30 minutes of continuous aerobic without signs/symptoms of physical distress      Intensity   THRR 40-80% of Max Heartrate 59-118    Ratings of Perceived Exertion 11-13    Perceived Dyspnea 0-4      Progression   Progression Continue progressive overload as per policy without signs/symptoms or physical distress.      Resistance Training   Training Prescription Yes    Weight 3 lbs    Reps 10-15           Perform Capillary Blood Glucose checks as needed.  Exercise Prescription Changes:   Exercise Prescription Changes    Row Name 01/12/20 1000 01/19/20 1000           Response to Exercise   Blood Pressure (Admit) 106/64 108/69      Blood Pressure (Exercise) 128/70 124/80      Blood Pressure (Exit) 104/60 110/60      Heart Rate (Admit) 72 bpm 81 bpm      Heart Rate (Exercise) 93 bpm 128 bpm      Heart Rate (Exit) 70 bpm 81 bpm      Rating of Perceived Exertion (Exercise) 11 13      Perceived Dyspnea (Exercise) 0 0  Symptoms None None       Comments Pt's first day of exercise Reviewed Home Exercise Plan      Duration Progress to 10 minutes  continuous walking  at current work load and total walking time to 30-45 min Progress to 30 minutes of  aerobic without signs/symptoms of physical distress      Intensity THRR unchanged THRR unchanged        Progression   Progression Continue to progress workloads to maintain intensity without signs/symptoms of physical distress. Continue to progress workloads to maintain intensity without signs/symptoms of physical distress.      Average METs 3.3 3.75        Resistance Training   Training Prescription Yes Yes      Weight 4lbs 4lbs      Reps 10-15 10-15      Time 10 Minutes 10 Minutes        Recumbant Bike   Level 3 4      Watts 45 40      Minutes 15 15      METs 4.5 3.6        NuStep   Level -- 4      SPM -- 95      Minutes -- 15      METs -- 3.9        Arm Ergometer   Level 2 --      Minutes 15 --      METs 2.12 --        Home Exercise Plan   Plans to continue exercise at -- [employer] [employer] (comment)      Frequency -- Add 2 additional days to program exercise sessions.      Initial Home Exercises Provided -- 01/19/20             Exercise Comments:   Exercise Comments    Row Name 01/12/20 1013 01/19/20 1039         Exercise Comments Pt's first day of exercise. Pt oriented to exercise equipment. Pt responded well to exercise workloads. Will continue to monitor and progress pt as tolerated. Reviewed HEP with pt. Pt will exercise at local gym 2-3 days a week with his wife. Added Nustep to pt's prescription. Pt likes addition to exercise, states it is more challenging. Pt verbalized understanding of home exercise plan. Will continue to monitor and progres workloads as tolerated.             Exercise Goals and Review:   Exercise Goals    Row Name 01/08/20 0945             Exercise Goals   Increase Physical Activity Yes       Intervention Provide advice, education, support and counseling about physical activity/exercise needs.;Develop an individualized  exercise prescription for aerobic and resistive training based on initial evaluation findings, risk stratification, comorbidities and participant's personal goals.       Expected Outcomes Short Term: Attend rehab on a regular basis to increase amount of physical activity.;Long Term: Add in home exercise to make exercise part of routine and to increase amount of physical activity.;Long Term: Exercising regularly at least 3-5 days a week.       Increase Strength and Stamina Yes       Intervention Provide advice, education, support and counseling about physical activity/exercise needs.;Develop an individualized exercise prescription for aerobic and resistive training based on initial evaluation findings, risk stratification, comorbidities and participant's personal goals.  Expected Outcomes Short Term: Increase workloads from initial exercise prescription for resistance, speed, and METs.;Short Term: Perform resistance training exercises routinely during rehab and add in resistance training at home;Long Term: Improve cardiorespiratory fitness, muscular endurance and strength as measured by increased METs and functional capacity (6MWT)       Able to understand and use rate of perceived exertion (RPE) scale Yes       Intervention Provide education and explanation on how to use RPE scale       Expected Outcomes Short Term: Able to use RPE daily in rehab to express subjective intensity level;Long Term:  Able to use RPE to guide intensity level when exercising independently       Knowledge and understanding of Target Heart Rate Range (THRR) Yes       Intervention Provide education and explanation of THRR including how the numbers were predicted and where they are located for reference       Expected Outcomes Short Term: Able to state/look up THRR;Short Term: Able to use daily as guideline for intensity in rehab;Long Term: Able to use THRR to govern intensity when exercising independently       Able to check  pulse independently Yes       Intervention Provide education and demonstration on how to check pulse in carotid and radial arteries.;Review the importance of being able to check your own pulse for safety during independent exercise       Expected Outcomes Short Term: Able to explain why pulse checking is important during independent exercise;Long Term: Able to check pulse independently and accurately       Understanding of Exercise Prescription Yes       Intervention Provide education, explanation, and written materials on patient's individual exercise prescription       Expected Outcomes Short Term: Able to explain program exercise prescription;Long Term: Able to explain home exercise prescription to exercise independently              Exercise Goals Re-Evaluation :  Exercise Goals Re-Evaluation    Row Name 01/12/20 1013 01/19/20 1044           Exercise Goal Re-Evaluation   Exercise Goals Review Able to understand and use rate of perceived exertion (RPE) scale;Knowledge and understanding of Target Heart Rate Range (THRR);Increase Physical Activity Increase Physical Activity;Increase Strength and Stamina;Able to understand and use rate of perceived exertion (RPE) scale;Knowledge and understanding of Target Heart Rate Range (THRR);Able to check pulse independently;Understanding of Exercise Prescription      Comments Pt's first day of exercise. Pt able to exercise for 30 minutes with no difficulty. Will continue to monitor. Reviewed HEP with pt. Also discussed THRR, RPE Scale, NTG use, weather conditions, endpoints of exercise, warmup and cool down.      Expected Outcomes Pt will continue to increase strength and stamina. Will follow up with home exercise plan. Pt will exercise 2-3 days at local gym in addition to exercise at cardiac rehab.              Discharge Exercise Prescription (Final Exercise Prescription Changes):  Exercise Prescription Changes - 01/19/20 1000      Response to  Exercise   Blood Pressure (Admit) 108/69    Blood Pressure (Exercise) 124/80    Blood Pressure (Exit) 110/60    Heart Rate (Admit) 81 bpm    Heart Rate (Exercise) 128 bpm    Heart Rate (Exit) 81 bpm    Rating of Perceived Exertion (Exercise) 13  Perceived Dyspnea (Exercise) 0    Symptoms None     Comments Reviewed Home Exercise Plan    Duration Progress to 30 minutes of  aerobic without signs/symptoms of physical distress    Intensity THRR unchanged      Progression   Progression Continue to progress workloads to maintain intensity without signs/symptoms of physical distress.    Average METs 3.75      Resistance Training   Training Prescription Yes    Weight 4lbs    Reps 10-15    Time 10 Minutes      Recumbant Bike   Level 4    Watts 40    Minutes 15    METs 3.6      NuStep   Level 4    SPM 95    Minutes 15    METs 3.9      Home Exercise Plan   Plans to continue exercise at [employer] [facility] (comment)    Frequency Add 2 additional days to program exercise sessions.    Initial Home Exercises Provided 01/19/20           Nutrition:  Target Goals: Understanding of nutrition guidelines, daily intake of sodium 1500mg , cholesterol 200mg , calories 30% from fat and 7% or less from saturated fats, daily to have 5 or more servings of fruits and vegetables.  Biometrics:  Pre Biometrics - 01/08/20 0946      Pre Biometrics   Waist Circumference 34.75 inches    Hip Circumference 41 inches    Waist to Hip Ratio 0.85 %    Triceps Skinfold 8 mm    % Body Fat 21.2 %    Grip Strength 46 kg    Flexibility 19.5 in    Single Leg Stand 6.25 seconds   Moderate Risk for fall           Nutrition Therapy Plan and Nutrition Goals:  Nutrition Therapy & Goals - 01/14/20 1015      Nutrition Therapy   Diet Heart Healthy    Drug/Food Interactions Statins/Certain Fruits      Personal Nutrition Goals   Nutrition Goal Pt to build a healthy plate including vegetables, fruits,  whole grains, and low-fat dairy products in a heart healthy meal plan.    Personal Goal #2 Pt able to name foods that affect blood glucose      Intervention Plan   Intervention Prescribe, educate and counsel regarding individualized specific dietary modifications aiming towards targeted core components such as weight, hypertension, lipid management, diabetes, heart failure and other comorbidities.    Expected Outcomes Short Term Goal: Understand basic principles of dietary content, such as calories, fat, sodium, cholesterol and nutrients.           Nutrition Assessments:  Nutrition Assessments - 01/13/20 1010      MEDFICTS Scores   Pre Score 52           Nutrition Goals Re-Evaluation:  Nutrition Goals Re-Evaluation    [place] Name 01/14/20 1015             Goals   Current Weight 154 lb (69.9 kg)              Nutrition Goals Discharge (Final Nutrition Goals Re-Evaluation):  Nutrition Goals Re-Evaluation - 01/14/20 1015      Goals   Current Weight 154 lb (69.9 kg)           Psychosocial: Target Goals: Acknowledge presence or absence of significant depression and/or stress, maximize coping  skills, provide positive support system. Participant is able to verbalize types and ability to use techniques and skills needed for reducing stress and depression.  Initial Review & Psychosocial Screening:  Initial Psych Review & Screening - 01/08/20 1154      Initial Review   Current issues with Current Depression;Current Stress Concerns    Source of Stress Concerns Chronic Illness    Comments Saturnino says that he has been depressed due to his recent hospitalization      [place]? Yes   Talan has his wife for support     Barriers   Psychosocial barriers to participate in program The patient should benefit from training in stress management and relaxation.      Screening Interventions   Interventions Encouraged to exercise;Provide feedback about  the scores to participant;To provide support and resources with identified psychosocial needs    Expected Outcomes Short Term goal: Utilizing psychosocial counselor, staff and physician to assist with identification of specific Stressors or current issues interfering with healing process. Setting desired goal for each stressor or current issue identified.;Long Term Goal: Stressors or current issues are controlled or eliminated.;Short Term goal: Identification and review with participant of any Quality of Life or Depression concerns found by scoring the questionnaire.;Long Term goal: The participant improves quality of Life and PHQ9 Scores as seen by post scores and/or verbalization of changes           Quality of Life Scores:  Quality of Life - 01/08/20 0939      Quality of Life   Select Quality of Life      Quality of Life Scores   Health/Function Pre 16.29 %    Socioeconomic Pre 20.36 %    Psych/Spiritual Pre 20.93 %    Family Pre 19.25 %    GLOBAL Pre 18.56 %          Scores of 19 and below usually indicate a poorer quality of life in these areas.  A difference of  2-3 points is a clinically meaningful difference.  A difference of 2-3 points in the total score of the Quality of Life Index has been associated with significant improvement in overall quality of life, self-image, physical symptoms, and general health in studies assessing change in quality of life.  PHQ-9: Recent Review Flowsheet Data    Depression screen [facility] 2/9 01/08/2020 01/25/2015   Decreased Interest 0 0   Down, Depressed, Hopeless 1  0   PHQ - 2 Score 1 0     Interpretation of Total Score  Total Score Depression Severity:  1-4 = Minimal depression, 5-9 = Mild depression, 10-14 = Moderate depression, 15-19 = Moderately severe depression, 20-27 = Severe depression   Psychosocial Evaluation and Intervention:   Psychosocial Re-Evaluation:  Psychosocial Re-Evaluation    [place] Name 01/20/20 1249              Psychosocial Re-Evaluation   Current issues with Current Depression       Comments Thorsten has not voiced any further depressed moves. Quality of life was reviewed with patient last week and forwarded to his primary care provider in [facility]       Expected Outcomes Will continue to monitor and provide assistance as needed       Interventions Encouraged to attend Cardiac Rehabilitation for the exercise;Stress management education       Continue Psychosocial Services  No Follow up required  Psychosocial Discharge (Final Psychosocial Re-Evaluation):  Psychosocial Re-Evaluation - 01/20/20 1249      Psychosocial Re-Evaluation   Current issues with Current Depression    Comments Andersen has not voiced any further depressed moves. Quality of life was reviewed with patient last week and forwarded to his primary care provider in [facility]    Expected Outcomes Will continue to monitor and provide assistance as needed    Interventions Encouraged to attend Cardiac Rehabilitation for the exercise;Stress management education    Continue Psychosocial Services  No Follow up required           Vocational Rehabilitation: Provide vocational rehab assistance to qualifying candidates.   Vocational Rehab Evaluation & Intervention:  Vocational Rehab - 01/08/20 1157      Initial Vocational Rehab Evaluation & Intervention   Assessment shows need for Vocational Rehabilitation No   Hermes is retired and does not need vocatinal rehab at this time          Education: Education Goals: Education classes will be provided on a weekly basis, covering required topics. Participant will state understanding/return demonstration of topics presented.  Learning Barriers/Preferences:  Learning Barriers/Preferences - 01/08/20 0940      Learning Barriers/Preferences   Learning Barriers Sight   Wears glasses   Learning Preferences Audio;Computer/Internet;Group Instruction;Individual  Instruction;Pictoral;Skilled Demonstration;Verbal Instruction;Video;Written Material           Education Topics: Hypertension, Hypertension Reduction -Define heart disease and high blood pressure. Discus how high blood pressure affects the body and ways to reduce high blood pressure.   Exercise and Your Heart -Discuss why it is important to exercise, the FITT principles of exercise, normal and abnormal responses to exercise, and how to exercise safely.   Angina -Discuss definition of angina, causes of angina, treatment of angina, and how to decrease risk of having angina.   Cardiac Medications -Review what the following cardiac medications are used for, how they affect the body, and side effects that may occur when taking the medications.  Medications include Aspirin, Beta blockers, calcium channel blockers, ACE Inhibitors, angiotensin receptor blockers, diuretics, digoxin, and antihyperlipidemics.   Congestive Heart Failure -Discuss the definition of CHF, how to live with CHF, the signs and symptoms of CHF, and how keep track of weight and sodium intake.   Heart Disease and Intimacy -Discus the effect sexual activity has on the heart, how changes occur during intimacy as we age, and safety during sexual activity.   Smoking Cessation / COPD -Discuss different methods to quit smoking, the health benefits of quitting smoking, and the definition of COPD.   Nutrition I: Fats -Discuss the types of cholesterol, what cholesterol does to the heart, and how cholesterol levels can be controlled.   Nutrition II: Labels -Discuss the different components of food labels and how to read food label   Heart Parts/Heart Disease and PAD -Discuss the anatomy of the heart, the pathway of blood circulation through the heart, and these are affected by heart disease.   Stress I: Signs and Symptoms -Discuss the causes of stress, how stress may lead to anxiety and depression, and ways to limit  stress.   Stress II: Relaxation -Discuss different types of relaxation techniques to limit stress.   Warning Signs of Stroke / TIA -Discuss definition of a stroke, what the signs and symptoms are of a stroke, and how to identify when someone is having stroke.   Knowledge Questionnaire Score:  Knowledge Questionnaire Score - 01/08/20 2951  Knowledge Questionnaire Score   Pre Score 19/24           Core Components/Risk Factors/Patient Goals at Admission:  Personal Goals and Risk Factors at Admission - 01/08/20 1200      Core Components/Risk Factors/Patient Goals on Admission    Weight Management Weight Maintenance;Yes    Intervention Weight Management: Develop a combined nutrition and exercise program designed to reach desired caloric intake, while maintaining appropriate intake of nutrient and fiber, sodium and fats, and appropriate energy expenditure required for the weight goal.;Weight Management: Provide education and appropriate resources to help participant work on and attain dietary goals.    Expected Outcomes Weight Maintenance: Understanding of the daily nutrition guidelines, which includes 25-35% calories from fat, 7% or less cal from saturated fats, less than 200mg  cholesterol, less than 1.5gm of sodium, & 5 or more servings of fruits and vegetables daily;Short Term: Continue to assess and modify interventions until short term weight is achieved;Long Term: Adherence to nutrition and physical activity/exercise program aimed toward attainment of established weight goal;Understanding of distribution of calorie intake throughout the day with the consumption of 4-5 meals/snacks;Understanding recommendations for meals to include 15-35% energy as protein, 25-35% energy from fat, 35-60% energy from carbohydrates, less than 200mg  of dietary cholesterol, 20-35 gm of total fiber daily    Lipids Yes    Intervention Provide education and support for participant on nutrition &  aerobic/resistive exercise along with prescribed medications to achieve LDL 70mg , HDL >40mg .    Expected Outcomes Short Term: Participant states understanding of desired cholesterol values and is compliant with medications prescribed. Participant is following exercise prescription and nutrition guidelines.;Long Term: Cholesterol controlled with medications as prescribed, with individualized exercise RX and with personalized nutrition plan. Value goals: LDL < 70mg , HDL > 40 mg.    Stress Yes    Intervention Offer individual and/or small group education and counseling on adjustment to heart disease, stress management and health-related lifestyle change. Teach and support self-help strategies.;Refer participants experiencing significant psychosocial distress to appropriate mental health specialists for further evaluation and treatment. When possible, include family members and significant others in education/counseling sessions.    Expected Outcomes Short Term: Participant demonstrates changes in health-related behavior, relaxation and other stress management skills, ability to obtain effective social support, and compliance with psychotropic medications if prescribed.;Long Term: Emotional wellbeing is indicated by absence of clinically significant psychosocial distress or social isolation.           Core Components/Risk Factors/Patient Goals Review:   Goals and Risk Factor Review    Row Name 01/13/20 0759 01/20/20 1254           Core Components/Risk Factors/Patient Goals Review   Personal Goals Review Weight Management/Obesity;Stress;Lipids Weight Management/Obesity;Stress;Lipids      Review Alixander started exercise on 01/12/20 and did well with exercise. Jordell is doing well with exercise. Joseff's vital signs have been stable. Woodson says he only plans to participate for one month then return to his home gym      Expected Kingsbury will continue to participate in cardiac rehab for exercise,  nutrition and lifestyle modifications Jaecob will continue to participate in cardiac rehab for exercise, nutrition and lifestyle modifications             Core Components/Risk Factors/Patient Goals at Discharge (Final Review):   Goals and Risk Factor Review - 01/20/20 1254      Core Components/Risk Factors/Patient Goals Review   Personal Goals Review Weight Management/Obesity;Stress;Lipids    Review [name]  is doing well with exercise. Brace's vital signs have been stable. Alexes says he only plans to participate for one month then return to his home gym    Expected Barrera will continue to participate in cardiac rehab for exercise, nutrition and lifestyle modifications           ITP Comments:  ITP Comments    Row Name 01/08/20 9971 01/13/20 0758 01/20/20 1247       ITP Comments Dr Fransico Him MD, Medical Director 30 Day ITP Review. Zackerie started exercise on 01/12/20 and did well with exercise. 30 Day ITP Review. Patient has good attendance and participation in phase 2 cardiac rehab. Victoriano is doing well with exercise.            Comments: See ITP comments.Barnet Pall, RN,BSN 01/22/2020 10:28 AM

## 2020-01-21 ENCOUNTER — Encounter (HOSPITAL_COMMUNITY)
Admission: RE | Admit: 2020-01-21 | Discharge: 2020-01-21 | Disposition: A | Discharge status: Home / Self Care | Payer: Medicare HMO | Source: Ambulatory Visit | Attending: Cardiovascular Disease | Admitting: Cardiovascular Disease

## 2020-01-21 ENCOUNTER — Other Ambulatory Visit: Payer: Self-pay

## 2020-01-21 DIAGNOSIS — I2111 ST elevation (STEMI) myocardial infarction involving right coronary artery: Secondary | ICD-10-CM | POA: Diagnosis not present

## 2020-01-23 ENCOUNTER — Encounter (HOSPITAL_COMMUNITY)
Admission: RE | Admit: 2020-01-23 | Discharge: 2020-01-23 | Disposition: A | Discharge status: Home / Self Care | Payer: Medicare HMO | Source: Ambulatory Visit | Attending: Cardiovascular Disease | Admitting: Cardiovascular Disease

## 2020-01-23 ENCOUNTER — Other Ambulatory Visit: Payer: Self-pay

## 2020-01-23 DIAGNOSIS — I2111 ST elevation (STEMI) myocardial infarction involving right coronary artery: Secondary | ICD-10-CM | POA: Insufficient documentation

## 2020-01-26 ENCOUNTER — Encounter (HOSPITAL_COMMUNITY)
Admission: RE | Admit: 2020-01-26 | Discharge: 2020-01-26 | Disposition: A | Discharge status: Home / Self Care | Payer: Medicare HMO | Source: Ambulatory Visit | Attending: Cardiovascular Disease | Admitting: Cardiovascular Disease

## 2020-01-26 ENCOUNTER — Other Ambulatory Visit: Payer: Self-pay

## 2020-01-26 DIAGNOSIS — I2111 ST elevation (STEMI) myocardial infarction involving right coronary artery: Secondary | ICD-10-CM | POA: Diagnosis not present

## 2020-01-28 ENCOUNTER — Other Ambulatory Visit: Payer: Self-pay

## 2020-01-28 ENCOUNTER — Encounter (HOSPITAL_COMMUNITY)
Admission: RE | Admit: 2020-01-28 | Discharge: 2020-01-28 | Disposition: A | Discharge status: Home / Self Care | Payer: Medicare HMO | Source: Ambulatory Visit | Attending: Cardiovascular Disease | Admitting: Cardiovascular Disease

## 2020-01-28 DIAGNOSIS — I2111 ST elevation (STEMI) myocardial infarction involving right coronary artery: Secondary | ICD-10-CM | POA: Diagnosis not present

## 2020-01-29 ENCOUNTER — Telehealth (HOSPITAL_COMMUNITY): Payer: Self-pay | Admitting: Internal Medicine

## 2020-01-30 ENCOUNTER — Other Ambulatory Visit: Payer: Self-pay

## 2020-01-30 ENCOUNTER — Encounter (HOSPITAL_COMMUNITY)
Admission: RE | Admit: 2020-01-30 | Discharge: 2020-01-30 | Disposition: A | Discharge status: Home / Self Care | Payer: Medicare HMO | Source: Ambulatory Visit | Attending: Cardiovascular Disease | Admitting: Cardiovascular Disease

## 2020-01-30 VITALS — BP 114/70 | HR 85 | Ht 68.0 in | Wt 159.4 lb

## 2020-01-30 DIAGNOSIS — I2111 ST elevation (STEMI) myocardial infarction involving right coronary artery: Secondary | ICD-10-CM | POA: Diagnosis not present

## 2020-01-30 NOTE — Progress Notes (Signed)
Discharge Progress Report  Patient Details  Name: Alan Wall MRN: [id number] Date of Birth: [birth year] Referring Provider:     CARDIAC REHAB PHASE II ORIENTATION from 01/08/2020 in [place]  Referring Provider Sherren Mocha M.D.        Number of Visits: 9  Reason for Discharge:  Patient reached a stable level of exercise. Patient independent in their exercise. Patient has met program and personal goals.  Smoking History:  Social History   Tobacco Use  Smoking Status Never Smoker  Smokeless Tobacco Never Used    Diagnosis:  STEMI 12/01/19 Unsuceesful PCI to RCA, Medical Treatment  ADL UCSD:    Initial Exercise Prescription:  Initial Exercise Prescription - 01/08/20 0900       Date of Initial Exercise RX and Referring Provider   Date 01/08/20    Referring Provider Sherren Mocha M.D.    Expected Discharge Date 03/05/20      Recumbant Bike   Level 2    Watts 30    Minutes 15    METs 2.5      Arm Ergometer   Level 2    Minutes 15    METs 2      Prescription Details   Frequency (times per week) 3    Duration Progress to 30 minutes of continuous aerobic without signs/symptoms of physical distress      Intensity   THRR 40-80% of Max Heartrate 59-118    Ratings of Perceived Exertion 11-13    Perceived Dyspnea 0-4      Progression   Progression Continue progressive overload as per policy without signs/symptoms or physical distress.      Resistance Training   Training Prescription Yes    Weight 3 lbs    Reps 10-15             Discharge Exercise Prescription (Final Exercise Prescription Changes):  Exercise Prescription Changes - 01/19/20 1000       Response to Exercise   Blood Pressure (Admit) 108/69    Blood Pressure (Exercise) 124/80    Blood Pressure (Exit) 110/60    Heart Rate (Admit) 81 bpm    Heart Rate (Exercise) 128 bpm    Heart Rate (Exit) 81 bpm    Rating of Perceived Exertion (Exercise) 13     Perceived Dyspnea (Exercise) 0    Symptoms None     Comments Reviewed Home Exercise Plan    Duration Progress to 30 minutes of  aerobic without signs/symptoms of physical distress    Intensity THRR unchanged      Progression   Progression Continue to progress workloads to maintain intensity without signs/symptoms of physical distress.    Average METs 3.75      Resistance Training   Training Prescription Yes    Weight 4lbs    Reps 10-15    Time 10 Minutes      Recumbant Bike   Level 4    Watts 40    Minutes 15    METs 3.6      NuStep   Level 4    SPM 95    Minutes 15    METs 3.9      Home Exercise Plan   Plans to continue exercise at [employer] (comment)    Frequency Add 2 additional days to program exercise sessions.    Initial Home Exercises Provided 01/19/20             Functional Capacity:  6  Minute Walk     Row Name 01/08/20 0837         6 Minute Walk   Phase Initial     Distance 1668 feet     Walk Time 6 minutes     # of Rest Breaks 0     MPH 3.16     METS 3.49     RPE 12     Perceived Dyspnea  0     VO2 Peak 12.21     Symptoms No     Resting HR 72 bpm     Resting BP 104/64     Resting Oxygen Saturation  97 %     Exercise Oxygen Saturation  during 6 min walk 97 %     Max Ex. HR 96 bpm     Max Ex. BP 116/72     2 Minute Post BP 110/70              Psychological, QOL, Others - Outcomes: PHQ 2/9: Depression screen [facility] 2/9 01/30/2020 01/08/2020 01/25/2015  Decreased Interest 0 0 0  Down, Depressed, Hopeless 0 1 0  PHQ - 2 Score 0 1 0    Quality of Life:  Quality of Life - 01/08/20 0939       Quality of Life   Select Quality of Life      Quality of Life Scores   Health/Function Pre 16.29 %    Socioeconomic Pre 20.36 %    Psych/Spiritual Pre 20.93 %    Family Pre 19.25 %    GLOBAL Pre 18.56 %             Personal Goals: Goals established at orientation with interventions provided to work toward goal.  Personal  Goals and Risk Factors at Admission - 01/08/20 1200       Core Components/Risk Factors/Patient Goals on Admission    Weight Management Weight Maintenance;Yes    Intervention Weight Management: Develop a combined nutrition and exercise program designed to reach desired caloric intake, while maintaining appropriate intake of nutrient and fiber, sodium and fats, and appropriate energy expenditure required for the weight goal.;Weight Management: Provide education and appropriate resources to help participant work on and attain dietary goals.    Expected Outcomes Weight Maintenance: Understanding of the daily nutrition guidelines, which includes 25-35% calories from fat, 7% or less cal from saturated fats, less than $RemoveB'200mg'pZqENOUR$  cholesterol, less than 1.5gm of sodium, & 5 or more servings of fruits and vegetables daily;Short Term: Continue to assess and modify interventions until short term weight is achieved;Long Term: Adherence to nutrition and physical activity/exercise program aimed toward attainment of established weight goal;Understanding of distribution of calorie intake throughout the day with the consumption of 4-5 meals/snacks;Understanding recommendations for meals to include 15-35% energy as protein, 25-35% energy from fat, 35-60% energy from carbohydrates, less than $RemoveB'200mg'djQEhoJr$  of dietary cholesterol, 20-35 gm of total fiber daily    Lipids Yes    Intervention Provide education and support for participant on nutrition & aerobic/resistive exercise along with prescribed medications to achieve LDL '70mg'$ , HDL >$Remo'40mg'hVTWa$ .    Expected Outcomes Short Term: Participant states understanding of desired cholesterol values and is compliant with medications prescribed. Participant is following exercise prescription and nutrition guidelines.;Long Term: Cholesterol controlled with medications as prescribed, with individualized exercise RX and with personalized nutrition plan. Value goals: LDL < $Rem'70mg'COFr$ , HDL > 40 mg.    Stress Yes     Intervention Offer individual and/or small group education and counseling on  adjustment to heart disease, stress management and health-related lifestyle change. Teach and support self-help strategies.;Refer participants experiencing significant psychosocial distress to appropriate mental health specialists for further evaluation and treatment. When possible, include family members and significant others in education/counseling sessions.    Expected Outcomes Short Term: Participant demonstrates changes in health-related behavior, relaxation and other stress management skills, ability to obtain effective social support, and compliance with psychotropic medications if prescribed.;Long Term: Emotional wellbeing is indicated by absence of clinically significant psychosocial distress or social isolation.              Personal Goals Discharge:  Goals and Risk Factor Review     Row Name 01/13/20 0759 01/20/20 1254 01/30/20 1010         Core Components/Risk Factors/Patient Goals Review   Personal Goals Review Weight Management/Obesity;Stress;Lipids Weight Management/Obesity;Stress;Lipids Weight Management/Obesity;Stress;Lipids     Review Thom started exercise on 01/12/20 and did well with exercise. Delante is doing well with exercise. Beau's vital signs have been stable. Acie says he only plans to participate for one month then return to his home gym Fredi completed cardiac rehab on 01/30/20. Jaekwon did well with exercise and plans to continue exercise at the [employer] [facility] with his wife.     Expected Outcomes Sylas will continue to participate in cardiac rehab for exercise, nutrition and lifestyle modifications Abbott will continue to participate in cardiac rehab for exercise, nutrition and lifestyle modifications Victor will continue exercise on his own follow nutrition and life style modifications upon completion of phase 2 cardiac rehab              Exercise Goals and Review:  Exercise Goals     Row  Name 01/08/20 0945             Exercise Goals   Increase Physical Activity Yes       Intervention Provide advice, education, support and counseling about physical activity/exercise needs.;Develop an individualized exercise prescription for aerobic and resistive training based on initial evaluation findings, risk stratification, comorbidities and participant's personal goals.       Expected Outcomes Short Term: Attend rehab on a regular basis to increase amount of physical activity.;Long Term: Add in home exercise to make exercise part of routine and to increase amount of physical activity.;Long Term: Exercising regularly at least 3-5 days a week.       Increase Strength and Stamina Yes       Intervention Provide advice, education, support and counseling about physical activity/exercise needs.;Develop an individualized exercise prescription for aerobic and resistive training based on initial evaluation findings, risk stratification, comorbidities and participant's personal goals.       Expected Outcomes Short Term: Increase workloads from initial exercise prescription for resistance, speed, and METs.;Short Term: Perform resistance training exercises routinely during rehab and add in resistance training at home;Long Term: Improve cardiorespiratory fitness, muscular endurance and strength as measured by increased METs and functional capacity (6MWT)       Able to understand and use rate of perceived exertion (RPE) scale Yes       Intervention Provide education and explanation on how to use RPE scale       Expected Outcomes Short Term: Able to use RPE daily in rehab to express subjective intensity level;Long Term:  Able to use RPE to guide intensity level when exercising independently       Knowledge and understanding of Target Heart Rate Range (THRR) Yes       Intervention Provide education and  explanation of THRR including how the numbers were predicted and where they are located for reference        Expected Outcomes Short Term: Able to state/look up THRR;Short Term: Able to use daily as guideline for intensity in rehab;Long Term: Able to use THRR to govern intensity when exercising independently       Able to check pulse independently Yes       Intervention Provide education and demonstration on how to check pulse in carotid and radial arteries.;Review the importance of being able to check your own pulse for safety during independent exercise       Expected Outcomes Short Term: Able to explain why pulse checking is important during independent exercise;Long Term: Able to check pulse independently and accurately       Understanding of Exercise Prescription Yes       Intervention Provide education, explanation, and written materials on patient's individual exercise prescription       Expected Outcomes Short Term: Able to explain program exercise prescription;Long Term: Able to explain home exercise prescription to exercise independently                Exercise Goals Re-Evaluation:  Exercise Goals Re-Evaluation     Row Name 01/12/20 1013 01/19/20 1044           Exercise Goal Re-Evaluation   Exercise Goals Review Able to understand and use rate of perceived exertion (RPE) scale;Knowledge and understanding of Target Heart Rate Range (THRR);Increase Physical Activity Increase Physical Activity;Increase Strength and Stamina;Able to understand and use rate of perceived exertion (RPE) scale;Knowledge and understanding of Target Heart Rate Range (THRR);Able to check pulse independently;Understanding of Exercise Prescription      Comments Pt's first day of exercise. Pt able to exercise for 30 minutes with no difficulty. Will continue to monitor. Reviewed HEP with pt. Also discussed THRR, RPE Scale, NTG use, weather conditions, endpoints of exercise, warmup and cool down.      Expected Outcomes Pt will continue to increase strength and stamina. Will follow up with home exercise plan. Pt will  exercise 2-3 days at local gym in addition to exercise at cardiac rehab.               Nutrition & Weight - Outcomes:  Pre Biometrics - 01/08/20 0946       Pre Biometrics   Waist Circumference 34.75 inches    Hip Circumference 41 inches    Waist to Hip Ratio 0.85 %    Triceps Skinfold 8 mm    % Body Fat 21.2 %    Grip Strength 46 kg    Flexibility 19.5 in    Single Leg Stand 6.25 seconds   Moderate Risk for fall              Nutrition:  Nutrition Therapy & Goals - 01/14/20 1015       Nutrition Therapy   Diet Heart Healthy    Drug/Food Interactions Statins/Certain Fruits      Personal Nutrition Goals   Nutrition Goal Pt to build a healthy plate including vegetables, fruits, whole grains, and low-fat dairy products in a heart healthy meal plan.    Personal Goal #2 Pt able to name foods that affect blood glucose      Intervention Plan   Intervention Prescribe, educate and counsel regarding individualized specific dietary modifications aiming towards targeted core components such as weight, hypertension, lipid management, diabetes, heart failure and other comorbidities.    Expected Outcomes Short  Term Goal: Understand basic principles of dietary content, such as calories, fat, sodium, cholesterol and nutrients.             Nutrition Discharge:  Nutrition Assessments - 01/13/20 1010       MEDFICTS Scores   Pre Score 52             Education Questionnaire Score:  Knowledge Questionnaire Score - 01/08/20 0942       Knowledge Questionnaire Score   Pre Score 19/24             Goals reviewed with patient; copy given to patient.Pt graduated from cardiac rehab program today with completion of 9 exercise sessions in Phase II. Pt maintained good attendance and progressed nicely during his participation in rehab as evidenced by increased MET level.   Medication list reconciled. Repeat  PHQ score-  0.  Pt has made significant lifestyle changes and should  be commended for his success. Pt feels he has achieved his goals during cardiac rehab.   Pt plans to continue exercise at the [facility] with his wife.Barnet Pall, RN,BSN 01/30/2020 10:16 AM

## 2020-02-01 NOTE — Progress Notes (Signed)
Cardiology Office Note:    Date:  02/02/2020   ID:  Alan Wall, DOB 1945/05/30, MRN [id number]  PCP:  Barbra Sarks, MD  [facility] Cardiologist:  Sherren Mocha, MD   [facility] Electrophysiologist:  None   Referring MD: Barbra Sarks, MD   Chief Complaint:  Follow-up (CAD)    Patient Profile:    Alan Wall is a 74 y.o. male with:   Coronary artery disease  ? S/p inferior STEMI 8/21 >> unsuccessful PCI of the PRDA and RPL1 (severe ectasia)  Ischemic CM ? Echocardiogram 8/21: EF 45-50  Hyperlipidemia   OSA on CPAP  Hx of esophageal stricture s/p dilation   Bradycardia.   Prior CV studies: Echocardiogram 12/02/19 EF 45-50, inf, inf-sept, inf-lat severe HK, normal RVSF, RVSP 29.8, mild to mod MR  Cardiac catheterization 12/01/19 LAD mid 40 LCx prox ectasia  RCA severe diff ectasia; RPDA 95; RPL1 100 (thrombotic) PCI:  Unsuccessful POBA to RPDA and RPL1 (not amenable to stenting due to slow flow overall and severe ectasia   History of Present Illness:    [name] suffered an inferior STEMI in 11/2019.  An emergent cardiac catheterization demonstrated total occlusion of the 1st PL branch of the RCA (likely distal embolization from aneurysmal disease) with severe diffuse ectasia throughout the RCA and severe stenosis at the ostium of the PDA.  There was also ectasia at the proximal LCx with non-obstructive disease.  PCI of the RCA sub branches was unsuccessful.  The vessels were not candidates for stenting due to slow overall flow and severe ectasia.  EF was 45-50.  He has been managed medically.  He was last seen in 11/2019 for post hospitalization follow up.    He is here alone.  He has graduated from cardiac rehabilitation.  He plans to resume exercise at his gym with a trainer.  He also plans to switch to [facility] integrative medicine and see cardiology there.  Since last seen, he has not had chest discomfort, shortness of breath, syncope,  leg swelling.  He brings in a list of his blood pressures.  Most of his readings are optimal.  He is tolerating rosuvastatin.      Past Medical History:  Diagnosis Date  . Allergy    Food/cat dander/bee stings  . Coronary artery disease   . Hematoma of leg   . Hyperlipidemia   . Myocardial infarction ([place])   . S/P dilatation of esophageal stricture   . Sleep apnea     Current Medications: Current Meds  Medication Sig  . aspirin EC 81 MG tablet Take 1 tablet (81 mg total) by mouth daily. Swallow whole.  . Cholecalciferol (VITAMIN D3 PO) Take 1 tablet by mouth daily.  . clopidogrel (PLAVIX) 75 MG tablet Take 1 tablet (75 mg total) by mouth daily.  . fluticasone (FLONASE) 50 MCG/ACT nasal spray Place 2 sprays into both nostrils daily as needed for allergies or rhinitis.   Marland Kitchen isosorbide mononitrate (IMDUR) 30 MG 24 hr tablet Take 1 tablet (30 mg total) by mouth daily.  . melatonin 5 MG TABS Take 5-10 mg by mouth at bedtime as needed (sleep).   . metoprolol succinate (TOPROL-XL) 25 MG 24 hr tablet Take 1 tablet (25 mg total) by mouth daily.  . nitroGLYCERIN (NITROSTAT) 0.4 MG SL tablet Place 1 tablet (0.4 mg total) under the tongue every 5 (five) minutes as needed for chest pain.  Marland Kitchen nystatin cream (MYCOSTATIN) Apply 1 application topically 2 (two)  times daily as needed for dry skin.  . rosuvastatin (CRESTOR) 40 MG tablet Take 1 tablet (40 mg total) by mouth daily.  . zaleplon (SONATA) 5 MG capsule Take 5-10 mg by mouth at bedtime as needed for sleep.      Allergies:   Bee venom and Other   Social History   Tobacco Use  . Smoking status: Never Smoker  . Smokeless tobacco: Never Used  Vaping Use  . Vaping Use: Never used  Substance Use Topics  . Alcohol use: Yes    Alcohol/week: 2.0 standard drinks    Types: 2 Glasses of wine per week    Comment: 2 drinks per week  . Drug use: No     Family Hx: The patient's family history includes Coronary artery disease in an other family  member; Heart disease in his brother and father; Prostate cancer in his paternal uncle.  ROS   EKGs/Labs/Other Test Reviewed:    EKG:  EKG is not ordered today.  The ekg ordered today demonstrates n/a  Recent Labs: 12/01/2019: ALT 26 12/02/2019: Hemoglobin 13.8; Platelets 192 12/04/2019: BUN 11; Creatinine, Ser 1.10; Magnesium 2.3; Potassium 3.8; Sodium 136   Recent Lipid Panel Lab Results  Component Value Date/Time   CHOL 179 12/01/2019 03:55 PM   TRIG 76 12/01/2019 03:55 PM   HDL 60 12/01/2019 03:55 PM   CHOLHDL 3.0 12/01/2019 03:55 PM   LDLCALC 104 (H) 12/01/2019 03:55 PM   LDLDIRECT 125.2 06/12/2012 08:08 AM    Physical Exam:    VS:  BP 118/62   Pulse 68   Ht 5\' 8"  (1.727 m)   Wt 160 lb 12.8 oz (72.9 kg)   SpO2 97%   BMI 24.45 kg/m     Wt Readings from Last 3 Encounters:  02/02/20 160 lb 12.8 oz (72.9 kg)  01/30/20 159 lb 6.3 oz (72.3 kg)  01/08/20 154 lb 8.7 oz (70.1 kg)     Constitutional:      Appearance: Healthy appearance. Not in distress.  Pulmonary:     Effort: Pulmonary effort is normal.     Breath sounds: No wheezing. No rales.  Cardiovascular:     Normal rate. Regular rhythm. Normal S1. Normal S2.     Murmurs: There is no murmur.  Edema:    Peripheral edema absent.  Abdominal:     Palpations: Abdomen is soft.  Musculoskeletal:     Cervical back: Neck supple. Skin:    General: Skin is warm and dry.  Neurological:     General: No focal deficit present.     Mental Status: Alert and oriented to person, place and time.     Cranial Nerves: Cranial nerves are intact.       ASSESSMENT & PLAN:    1. Coronary artery disease involving native coronary artery of native heart without angina pectoris History of inferior STEMI in 11/2019 secondary to occlusion of the first PL branch of the RCA felt to be due to distal embolization from aneurysmal disease.  PCI was unsuccessful.  He has been managed medically.  He has been doing very well without chest pain or  shortness of breath.  He is tolerating dual antiplatelet therapy and will need to remain on this long-term given aneurysmal disease.  Continue aspirin, clopidogrel, metoprolol succinate, rosuvastatin.  He can follow-up with Dr. Burt Knack in 3 to 5 months.  However, if he does transition over to [facility] integrative medicine, he can cancel the appointment here.  2. Ischemic cardiomyopathy EF  45-50 by echocardiogram.  NYHA I.  No evidence of volume excess.  Continue low-dose beta-blocker therapy.  He remains on isosorbide.  I think his blood pressure should be able to tolerate very small dose of lisinopril.  Start lisinopril 2.5 mg daily.  Obtain follow-up CMET in 2 weeks.  If his blood pressure runs low with this and he is symptomatic, we will need to stop lisinopril.  3. Hypercholesterolemia Continue rosuvastatin.  Arrange fasting CMET, lipids in 2 weeks.  Goal LDL < 70.      Dispo:  Return in about 5 months (around 07/02/2020) for Routine Follow Up, w/ Dr. Burt Knack, in person.   Medication Adjustments/Labs and Tests Ordered: Current medicines are reviewed at length with the patient today.  Concerns regarding medicines are outlined above.  Tests Ordered: Orders Placed This Encounter  Procedures  . Comprehensive metabolic panel  . Lipid panel   Medication Changes: Meds ordered this encounter  Medications  . lisinopril (ZESTRIL) 2.5 MG tablet    Sig: Take 1 tablet (2.5 mg total) by mouth daily.    Dispense:  90 tablet    Refill:  3    Signed, Richardson Dopp, PA-C  02/02/2020 1:02 PM    [facility] [place], [place], [place]  [address] Phone: [phone]; Fax: [phone]

## 2020-02-02 ENCOUNTER — Encounter (HOSPITAL_COMMUNITY): Payer: Medicare HMO

## 2020-02-02 ENCOUNTER — Ambulatory Visit: Payer: Medicare HMO | Admitting: Physician Assistant

## 2020-02-02 ENCOUNTER — Encounter: Payer: Self-pay | Admitting: Physician Assistant

## 2020-02-02 ENCOUNTER — Other Ambulatory Visit: Payer: Self-pay

## 2020-02-02 VITALS — BP 118/62 | HR 68 | Ht 68.0 in | Wt 160.8 lb

## 2020-02-02 DIAGNOSIS — E7800 Pure hypercholesterolemia, unspecified: Secondary | ICD-10-CM | POA: Diagnosis not present

## 2020-02-02 DIAGNOSIS — I255 Ischemic cardiomyopathy: Secondary | ICD-10-CM | POA: Diagnosis not present

## 2020-02-02 DIAGNOSIS — I2510 Atherosclerotic heart disease of native coronary artery without angina pectoris: Secondary | ICD-10-CM | POA: Diagnosis not present

## 2020-02-02 MED ORDER — LISINOPRIL 2.5 MG PO TABS
2.5000 mg | ORAL_TABLET | Freq: Every day | ORAL | 3 refills | Status: AC
Start: 2020-02-02 — End: ?

## 2020-02-02 NOTE — Patient Instructions (Addendum)
Medication Instructions:  Your physician has recommended you make the following change in your medication:   1) Start Lisinopril 2.5 mg, 1 tablet by mouth once a day  *If you need a refill on your cardiac medications before your next appointment, please call your pharmacy*  Lab Work: Your physician recommends that you return for lab work in 2 weeks on 02/16/20 **The lab is open from 7:30AM-4:30PM** You may come fasting anytime between these hours so cholesterol can be checked.  Testing/Procedures: None ordered today  Follow-Up: At [facility], you and your health needs are our priority.  As part of our continuing mission to provide you with exceptional heart care, we have created designated Provider Care Teams.  These Care Teams include your primary Cardiologist (physician) and Advanced Practice Providers (APPs -  Physician Assistants and Nurse Practitioners) who all work together to provide you with the care you need, when you need it.  Your next appointment:   4 month(s)  The format for your next appointment:   In Person  Provider:   Sherren Mocha, MD

## 2020-02-03 ENCOUNTER — Ambulatory Visit: Payer: Medicare HMO | Admitting: Physician Assistant

## 2020-02-04 ENCOUNTER — Encounter (HOSPITAL_COMMUNITY): Payer: Medicare HMO

## 2020-02-06 ENCOUNTER — Encounter (HOSPITAL_COMMUNITY): Payer: Medicare HMO

## 2020-02-09 ENCOUNTER — Encounter (HOSPITAL_COMMUNITY): Payer: Medicare HMO

## 2020-02-11 ENCOUNTER — Encounter (HOSPITAL_COMMUNITY): Payer: Medicare HMO

## 2020-02-13 ENCOUNTER — Encounter (HOSPITAL_COMMUNITY): Payer: Medicare HMO

## 2020-02-14 ENCOUNTER — Emergency Department (HOSPITAL_COMMUNITY): Payer: Medicare HMO

## 2020-02-14 ENCOUNTER — Encounter (HOSPITAL_COMMUNITY): Payer: Self-pay

## 2020-02-14 ENCOUNTER — Emergency Department (HOSPITAL_COMMUNITY)
Admission: EM | Admit: 2020-02-14 | Discharge: 2020-02-14 | Disposition: A | Discharge status: Home / Self Care | Payer: Medicare HMO | Attending: Emergency Medicine | Admitting: Emergency Medicine

## 2020-02-14 DIAGNOSIS — I25119 Atherosclerotic heart disease of native coronary artery with unspecified angina pectoris: Secondary | ICD-10-CM | POA: Diagnosis not present

## 2020-02-14 DIAGNOSIS — Z7902 Long term (current) use of antithrombotics/antiplatelets: Secondary | ICD-10-CM | POA: Diagnosis not present

## 2020-02-14 DIAGNOSIS — Z951 Presence of aortocoronary bypass graft: Secondary | ICD-10-CM | POA: Insufficient documentation

## 2020-02-14 DIAGNOSIS — I208 Other forms of angina pectoris: Secondary | ICD-10-CM

## 2020-02-14 DIAGNOSIS — R200 Anesthesia of skin: Secondary | ICD-10-CM | POA: Diagnosis present

## 2020-02-14 DIAGNOSIS — Z7901 Long term (current) use of anticoagulants: Secondary | ICD-10-CM | POA: Diagnosis not present

## 2020-02-14 DIAGNOSIS — R001 Bradycardia, unspecified: Secondary | ICD-10-CM | POA: Diagnosis not present

## 2020-02-14 DIAGNOSIS — Z20822 Contact with and (suspected) exposure to covid-19: Secondary | ICD-10-CM | POA: Diagnosis not present

## 2020-02-14 DIAGNOSIS — Z7982 Long term (current) use of aspirin: Secondary | ICD-10-CM | POA: Insufficient documentation

## 2020-02-14 LAB — CBC WITH DIFFERENTIAL/PLATELET
Abs Immature Granulocytes: 0.02 10*3/uL (ref 0.00–0.07)
Basophils Absolute: 0 10*3/uL (ref 0.0–0.1)
Basophils Relative: 1 %
Eosinophils Absolute: 0.1 10*3/uL (ref 0.0–0.5)
Eosinophils Relative: 1 %
HCT: 43.4 % (ref 39.0–52.0)
Hemoglobin: 13.6 g/dL (ref 13.0–17.0)
Immature Granulocytes: 0 %
Lymphocytes Relative: 24 %
Lymphs Abs: 1.2 10*3/uL (ref 0.7–4.0)
MCH: 32 pg (ref 26.0–34.0)
MCHC: 31.3 g/dL (ref 30.0–36.0)
MCV: 102.1 fL — ABNORMAL HIGH (ref 80.0–100.0)
Monocytes Absolute: 0.5 10*3/uL (ref 0.1–1.0)
Monocytes Relative: 10 %
Neutro Abs: 3.3 10*3/uL (ref 1.7–7.7)
Neutrophils Relative %: 64 %
Platelets: 186 10*3/uL (ref 150–400)
RBC: 4.25 MIL/uL (ref 4.22–5.81)
RDW: 12.3 % (ref 11.5–15.5)
WBC: 5.2 10*3/uL (ref 4.0–10.5)
nRBC: 0 % (ref 0.0–0.2)

## 2020-02-14 LAB — LIPID PANEL
Cholesterol: 118 mg/dL (ref 0–200)
HDL: 56 mg/dL
LDL Cholesterol: 51 mg/dL (ref 0–99)
Total CHOL/HDL Ratio: 2.1 ratio
Triglycerides: 55 mg/dL
VLDL: 11 mg/dL (ref 0–40)

## 2020-02-14 LAB — COMPREHENSIVE METABOLIC PANEL WITH GFR
ALT: 19 U/L (ref 0–44)
AST: 21 U/L (ref 15–41)
Albumin: 3.8 g/dL (ref 3.5–5.0)
Alkaline Phosphatase: 45 U/L (ref 38–126)
Anion gap: 8 (ref 5–15)
BUN: 13 mg/dL (ref 8–23)
CO2: 25 mmol/L (ref 22–32)
Calcium: 9.1 mg/dL (ref 8.9–10.3)
Chloride: 107 mmol/L (ref 98–111)
Creatinine, Ser: 1.01 mg/dL (ref 0.61–1.24)
GFR, Estimated: >60 mL/min
Glucose, Bld: 117 mg/dL — ABNORMAL HIGH (ref 70–99)
Potassium: 3.9 mmol/L (ref 3.5–5.1)
Sodium: 140 mmol/L (ref 135–145)
Total Bilirubin: 0.8 mg/dL (ref 0.3–1.2)
Total Protein: 6.1 g/dL — ABNORMAL LOW (ref 6.5–8.1)

## 2020-02-14 LAB — APTT: aPTT: 27 s (ref 24–36)

## 2020-02-14 LAB — RESPIRATORY PANEL BY RT PCR (FLU A&B, COVID)
Influenza A by PCR: NEGATIVE
Influenza B by PCR: NEGATIVE
SARS Coronavirus 2 by RT PCR: NEGATIVE

## 2020-02-14 LAB — PROTIME-INR
INR: 1.1 (ref 0.8–1.2)
Prothrombin Time: 14.2 seconds (ref 11.4–15.2)

## 2020-02-14 LAB — TROPONIN I (HIGH SENSITIVITY)
Troponin I (High Sensitivity): 6 ng/L (ref <18)
Troponin I (High Sensitivity): 7 ng/L (ref <18)

## 2020-02-14 MED ORDER — HEPARIN SODIUM (PORCINE) 5000 UNIT/ML IJ SOLN: 4000.0000 [IU] | Freq: Once | INTRAMUSCULAR | Status: DC

## 2020-02-14 MED ORDER — ASPIRIN 81 MG PO CHEW: 324.0000 mg | CHEWABLE_TABLET | Freq: Once | ORAL | Status: DC

## 2020-02-14 MED ORDER — SODIUM CHLORIDE 0.9 % IV SOLN: INTRAVENOUS | Status: DC

## 2020-02-14 NOTE — ED Notes (Signed)
Pt placed on 2L 02 per [place] 

## 2020-02-14 NOTE — ED Notes (Signed)
Pt's groin areas bilat were shaved.

## 2020-02-14 NOTE — ED Notes (Signed)
STEMI pads placed on pt

## 2020-02-14 NOTE — ED Triage Notes (Signed)
Pt arrived via [facility] for bilat arm tingling. Right arm greater than left arm. Pt is A&Ox4. Per EMS pt has STEMI. Per EMS pt has nitrox2 and had ASA 324mg .Pt is sinus brady w/ST elevation on monitor. VSS

## 2020-02-14 NOTE — Progress Notes (Signed)
Asked to evaluate ekg for possible inferior STEMI.  Pt with known large inferior scar on echo and prior ekgs following prior MI.  Of note, cath report 12/01/19 reveals that he had balloon angioplasty attempted of the RCA which was not successful with residual stenosis, slow overall flow and severe ectasia post cath.  On bedside evaluation, the patient presents with bilateral arm tingling/ numbness.  No chest pain or SOB currently.  I have personally evaluated his ekg and find sinus rhythm with inferior infarct pattern and mild inferior ST segment elevation.  Flattened t waves in V5, V6.   ekg from 12/22/19 is reviewed and also reveals inferior Q wave infarct pattern with TWI in V5, V6, though ST elevation is not as obvious as on the August 10th (post revascularization attempt) ekg in which there was similar to today.  The patient is clinically very stable.  I therefore do not feel that the patient meets criteria for emergent cath.  The patient will be checked in to the ED and evaluated by ED team.    I am happy to revisit after labs/ clinical picture is more certain if requested.  Thompson Grayer MD, [place] 02/14/2020 1:45 PM

## 2020-02-14 NOTE — ED Provider Notes (Signed)
[facility] EMERGENCY DEPARTMENT Provider Note   CSN: [id number] Arrival date & time: 02/14/20  1321     History Chief Complaint  Patient presents with  . Code STEMI    Zeki Bedrosian is a 74 y.o. male.  The history is provided by the patient, medical records and the EMS personnel.   Joon Pohle is a 74 y.o. male who presents to the Emergency Department complaining of numbness. He has a history of coronary artery disease with STEMI in September of this year. He presents the emergency department complaining of bilateral upper extremity numbness and tingling. Symptoms began after walking on some stairs. He took 324 of aspirin as well as to nitroglycerin. His symptoms significantly improved but he does have ongoing numbness to his arms. He had prior similar symptoms as well as chest pain when he presented with his STEMI in September. Due to similarity in symptoms he called 911. STEMI was activated prior to ED arrival by EMS. He saw his cardiologist at [facility] on Monday and his immature was discontinued. He denies any recent illnesses. No diaphoresis, shortness of breath, nausea, vomiting, abdominal pain.    Past Medical History:  Diagnosis Date  . Allergy    Food/cat dander/bee stings  . Coronary artery disease   . Hematoma of leg   . Hyperlipidemia   . Myocardial infarction ([place])   . S/P dilatation of esophageal stricture   . Sleep apnea     Patient Active Problem List   Diagnosis Date Noted  . NSVT (nonsustained ventricular tachycardia) ([place])   . Hypercholesterolemia   . Acute inferior myocardial infarction ([place]) 12/01/2019  . ST elevation myocardial infarction involving right coronary artery ([place]) 12/01/2019  . Seasonal and perennial allergic rhinitis 07/18/2016  . Intertrigo 03/21/2016  . Right arm numbness 12/31/2015  . Need for prophylactic vaccination against Streptococcus pneumoniae (pneumococcus) 12/31/2015  . Neoplasm of uncertain behavior  of skin 01/26/2015  . OSA on CPAP 01/25/2015  . CAD (coronary artery disease) 01/25/2015  . Well adult exam 05/20/2012  . Pruritus ani 02/13/2012  . Stress 02/13/2012  . Wound, open, forearm 01/26/2011  . Fall against object 01/25/2011  . Degloving injury 01/25/2011  . Flank pain, acute 01/25/2011  . Knee pain, acute 01/25/2011  . Concussion 01/25/2011  . Vertigo 12/13/2010  . INSOMNIA, PERSISTENT 11/23/2008  . ALLERGY, FOOD 11/23/2008  . GERD 04/16/2007  . ERECTILE DYSFUNCTION 04/16/2007  . Dyslipidemia 01/31/2007    Past Surgical History:  Procedure Laterality Date  . CARDIAC CATHETERIZATION    . CATARACT EXTRACTION W/ INTRAOCULAR LENS  IMPLANT, BILATERAL  2010  . CORONARY/GRAFT ACUTE MI REVASCULARIZATION N/A 12/01/2019   Procedure: Coronary/Graft Acute MI Revascularization;  Surgeon: Sherren Mocha, MD;  Location: [place] CV LAB;  Service: Cardiovascular;  Laterality: N/A;  . INGUINAL HERNIA REPAIR Left   . LEFT HEART CATH AND CORONARY ANGIOGRAPHY N/A 12/01/2019   Procedure: LEFT HEART CATH AND CORONARY ANGIOGRAPHY;  Surgeon: Sherren Mocha, MD;  Location: [facility] CV LAB;  Service: Cardiovascular;  Laterality: N/A;       Family History  Problem Relation Age of Onset  . Heart disease Father        CABG at 54 yo  . Heart disease Brother   . Prostate cancer Paternal Uncle   . Coronary artery disease Other     Social History   Tobacco Use  . Smoking status: Never Smoker  . Smokeless tobacco: Never Used  Vaping Use  .  Vaping Use: Never used  Substance Use Topics  . Alcohol use: Yes    Alcohol/week: 2.0 standard drinks    Types: 2 Glasses of wine per week    Comment: 2 drinks per week  . Drug use: No    Home Medications Prior to Admission medications   Medication Sig Start Date End Date Taking? Authorizing Provider  aspirin EC 81 MG tablet Take 1 tablet (81 mg total) by mouth daily. Swallow whole. 12/04/19 12/03/20 Yes Duke, Tami Lin, PA    Cholecalciferol (VITAMIN D3) 50 MCG (2000 UT) TABS Take 2,000 Units by mouth daily.    Yes [provider]  clopidogrel (PLAVIX) 75 MG tablet Take 1 tablet (75 mg total) by mouth daily. 12/22/19  Yes Weaver, Scott T, PA-C  fluticasone (FLONASE) 50 MCG/ACT nasal spray Place 2 sprays into both nostrils daily as needed for allergies or rhinitis.    Yes [provider]  lisinopril (ZESTRIL) 2.5 MG tablet Take 1 tablet (2.5 mg total) by mouth daily. 02/02/20  Yes Weaver, Scott T, PA-C  loratadine (CLARITIN) 10 MG tablet Take 10 mg by mouth daily as needed for allergies.   Yes [provider]  melatonin 5 MG TABS Take 5-10 mg by mouth at bedtime as needed (sleep).    Yes [provider]  metoprolol succinate (TOPROL-XL) 25 MG 24 hr tablet Take 1 tablet (25 mg total) by mouth daily. Patient taking differently: Take 25 mg by mouth at bedtime.  12/05/19  Yes Duke, Tami Lin, PA  nitroGLYCERIN (NITROSTAT) 0.4 MG SL tablet Place 1 tablet (0.4 mg total) under the tongue every 5 (five) minutes as needed for chest pain. 12/04/19 12/03/20 Yes Martinique, Peter M, MD  nystatin cream (MYCOSTATIN) Apply 1 application topically 2 (two) times daily as needed for dry skin.   Yes [provider]  rosuvastatin (CRESTOR) 40 MG tablet Take 1 tablet (40 mg total) by mouth daily. Patient taking differently: Take 40 mg by mouth at bedtime.  12/05/19  Yes Duke, Tami Lin, PA  zaleplon (SONATA) 5 MG capsule Take 5-10 mg by mouth at bedtime as needed for sleep.  09/19/19  Yes [provider]  isosorbide mononitrate (IMDUR) 30 MG 24 hr tablet Take 1 tablet (30 mg total) by mouth daily. 12/05/19   Duke, Tami Lin, PA    Allergies    Bee venom and Other  Review of Systems   Review of Systems  All other systems reviewed and are negative.   Physical Exam Updated Vital Signs BP (!) 131/91   Pulse (!) 46   Temp 97.7 F (36.5 C) (Oral)   Resp 13   Ht 5' 7.5" (1.715 m)    Wt 73 kg   SpO2 99%   BMI 24.84 kg/m   Physical Exam Vitals and nursing note reviewed.  Constitutional:      Appearance: He is well-developed.  HENT:     Head: Normocephalic and atraumatic.  Cardiovascular:     Rate and Rhythm: Regular rhythm. Bradycardia present.     Heart sounds: No murmur heard.   Pulmonary:     Effort: Pulmonary effort is normal. No respiratory distress.     Breath sounds: Normal breath sounds.  Abdominal:     Palpations: Abdomen is soft.     Tenderness: There is no abdominal tenderness. There is no guarding or rebound.  Musculoskeletal:        General: No swelling or tenderness.     Comments: 2+ pulses DP  and radial pulses bilaterally  Skin:    General: Skin is warm and dry.  Neurological:     Mental Status: He is alert and oriented to person, place, and time.     Comments: 5/5 strength in all four extremities with sensation to light touch intact in all four extremities.   Psychiatric:        Behavior: Behavior normal.     ED Results / Procedures / Treatments   Labs (all labs ordered are listed, but only abnormal results are displayed) Labs Reviewed  CBC WITH DIFFERENTIAL/PLATELET - Abnormal; Notable for the following components:      Result Value   MCV 102.1 (*)    All other components within normal limits  COMPREHENSIVE METABOLIC PANEL - Abnormal; Notable for the following components:   Glucose, Bld 117 (*)    Total Protein 6.1 (*)    All other components within normal limits  RESPIRATORY PANEL BY RT PCR (FLU A&B, COVID)  PROTIME-INR  APTT  LIPID PANEL  HEMOGLOBIN A1C  I-STAT CHEM 8, ED  TROPONIN I (HIGH SENSITIVITY)  TROPONIN I (HIGH SENSITIVITY)    EKG EKG Interpretation  Date/Time:  Saturday February 14 2020 13:27:45 EDT Ventricular Rate:  53 PR Interval:    QRS Duration: 103 QT Interval:  448 QTC Calculation: 421 R Axis:   32 Text Interpretation: Sinus rhythm Consider left atrial enlargement Inferior infarct, acute (LCx)  >>> Acute MI <<< Confirmed by Quintella Reichert [phone]) on 02/14/2020 1:31:50 PM   Radiology DG Chest Port 1 View  Result Date: 02/14/2020 CLINICAL DATA:  Chest pain.  Code STEMI. EXAM: PORTABLE CHEST - 1 VIEW COMPARISON:  11/14/2004 FINDINGS: Lungs clear. Heart size and mediastinal contours are within normal limits. No effusion.  No pneumothorax. Visualized bones unremarkable. IMPRESSION: No acute cardiopulmonary disease. Electronically Signed   By: Lucrezia Europe M.D.   On: 02/14/2020 15:02    Procedures Procedures (including critical care time)  Medications Ordered in ED Medications - No data to display  ED Course  I have reviewed the triage vital signs and the nursing notes.  Pertinent labs & imaging results that were available during my care of the patient were reviewed by me and considered in my medical decision making (see chart for details).    MDM Rules/Calculators/A&P                         patient with history of STEMI one month ago here for evaluation of paresthesias to bilateral upper extremities after exertion. He had no chest pain today but his paresthesias were similar to what preceded his initial STEMI. He was activated as a code STEMI prior to ED arrival due to ST elevation in his inferior leads. On ED presentation patient is pain free but does have paresthesias to his arms that has improved after Nitro administration. Discussed with cardiologist, Dr. Rayann Heman, who canceled STEMI alert due to EKGs being similar to priors. Troponin's are negative times two. Patient's symptoms continue to improve during ED stay. Presentation is not consistent with ACS. Question some degree of angina given recent discontinuation of Ender. Discussed with on-call cardiologist, Dr. Vickki Muff, who does not recommend restarting Imdur at this time. Will defer to his primary cardiologist regarding whether or not this medication should be restarted. Doubt PE, Dressler syndrome, pneumonia, CHF, dissection,  CVA.  Plan to discharge home with cardiology follow-up and return precautions. Final Clinical Impression(s) / ED Diagnoses Final diagnoses:  Angina of  effort [facility])    Rx / DC Orders ED Discharge Orders    None       Quintella Reichert, MD 02/14/20 2003

## 2020-02-16 ENCOUNTER — Encounter (HOSPITAL_COMMUNITY): Payer: Medicare HMO

## 2020-02-16 ENCOUNTER — Other Ambulatory Visit: Payer: Medicare HMO

## 2020-02-16 LAB — HEMOGLOBIN A1C
Hgb A1c MFr Bld: 6 % — ABNORMAL HIGH (ref 4.8–5.6)
Mean Plasma Glucose: 126 mg/dL

## 2020-02-18 ENCOUNTER — Encounter (HOSPITAL_COMMUNITY): Payer: Medicare HMO

## 2020-02-20 ENCOUNTER — Encounter (HOSPITAL_COMMUNITY): Payer: Medicare HMO

## 2020-02-23 ENCOUNTER — Encounter (HOSPITAL_COMMUNITY): Payer: Medicare HMO

## 2020-02-25 ENCOUNTER — Encounter (HOSPITAL_COMMUNITY): Payer: Medicare HMO

## 2020-02-27 ENCOUNTER — Encounter (HOSPITAL_COMMUNITY): Payer: Medicare HMO

## 2020-03-01 ENCOUNTER — Encounter (HOSPITAL_COMMUNITY): Payer: Medicare HMO

## 2020-03-03 ENCOUNTER — Encounter (HOSPITAL_COMMUNITY): Payer: Medicare HMO

## 2020-03-05 ENCOUNTER — Encounter (HOSPITAL_COMMUNITY): Payer: Medicare HMO

## 2020-10-11 NOTE — Progress Notes (Signed)
HPI male never smoker followed for OSA, Insomnia complicated by CAD, GERD NPSG 12/30/2013-AHI 19/hour, desaturation to 88%, body weight 165 pounds. ----------------------------------------------------------------------------   10/13/19- 75 year old male never smoker followed for OSA, Insomnia complicated by CAD, GERD, Allergic Rhinitis,  CPAP auto 12-16/ [facility]   Replaced in 2020 Body weight today- 162 lbs Download compliance 73%, AHI 1.2/ hr   Short nights Using CPAP every night, but some nights are short- discussed.  Option to refer for mask fitting and to buy supplies on-line as discussed. Covid vax- 2 [employer]  10/12/20- 75 year old male never smoker followed for OSA, Insomnia complicated by CAD, GERD, Allergic Rhinitis,  CPAP auto 12-16/ [facility]   Replaced in 2020 Body weight today- Covid vax-   CXR 1V- 02/14/20-  IMPRESSION: No acute cardiopulmonary disease.  ROS-see HPI   + = positive Constitutional:    weight loss, night sweats, fevers, chills, fatigue, lassitude. HEENT:    headaches, difficulty swallowing, tooth/dental problems, sore throat,       sneezing, itching, ear ache, + nasal congestion, post nasal drip, snoring CV:    chest pain, orthopnea, PND, swelling in lower extremities, anasarca,                                                       dizziness, palpitations Resp:   shortness of breath with exertion or at rest.                productive cough,   non-productive cough, coughing up of blood.              change in color of mucus.  wheezing.   Skin:    rash or lesions. GI:  No-   heartburn, indigestion, abdominal pain, nausea, vomiting, diarrhea,                 change in bowel habits, loss of appetite GU: dysuria, change in color of urine, no urgency or frequency.   flank pain. MS:   joint pain, stiffness, decreased range of motion, back pain. Neuro-     nothing unusual Psych:  change in mood or affect.  depression or anxiety.   memory loss.  OBJ- Physical Exam    not obese General- Alert, Oriented, Affect-appropriate, Distress- none acute, not oese Skin- rash-none, lesions- none, excoriation- none Lymphadenopathy- none Head- atraumatic            Eyes- Gross vision intact, PERRLA, conjunctivae and secretions clear            Ears- Hearing, canals-normal            Nose- Clear, no-Septal dev, mucus, polyps, erosion, perforation             Throat- Mallampati II , mucosa clear , drainage- none, tonsils- atrophic Neck- flexible , trachea midline, no stridor , thyroid nl, carotid no bruit Chest - symmetrical excursion , unlabored           Heart/CV- RRR , no murmur , no gallop  , no rub, nl s1 s2                           - JVD- none , edema- none, stasis changes- none, varices- none           Lung- clear to P&A,  wheeze- none, cough- none , dullness-none, rub- none           Chest wall-  Abd-  Br/ Gen/ Rectal- Not done, not indicated Extrem- cyanosis- none, clubbing, none, atrophy- none, strength- nl Neuro- grossly intact to observation

## 2020-10-12 ENCOUNTER — Encounter: Payer: Self-pay | Admitting: Internal Medicine

## 2020-10-12 ENCOUNTER — Other Ambulatory Visit: Payer: Self-pay

## 2020-10-12 ENCOUNTER — Ambulatory Visit: Payer: Medicare HMO | Admitting: Internal Medicine

## 2020-10-12 VITALS — BP 116/76 | HR 70 | Temp 98.0°F | Ht 67.5 in | Wt 163.6 lb

## 2020-10-12 DIAGNOSIS — G4700 Insomnia, unspecified: Secondary | ICD-10-CM

## 2020-10-12 DIAGNOSIS — Z9989 Dependence on other enabling machines and devices: Secondary | ICD-10-CM

## 2020-10-12 DIAGNOSIS — G4733 Obstructive sleep apnea (adult) (pediatric): Secondary | ICD-10-CM

## 2020-10-12 MED ORDER — TRAZODONE HCL 50 MG PO TABS: ORAL_TABLET | ORAL | 5 refills | Status: DC

## 2020-10-12 NOTE — Progress Notes (Signed)
HPI male never smoker followed for OSA, Insomnia complicated by CAD, GERD NPSG 12/30/2013-AHI 19/hour, desaturation to 88%, body weight 165 pounds. ----------------------------------------------------------------------------   10/13/19- 75 year old male never smoker followed for OSA, Insomnia complicated by CAD, GERD, Allergic Rhinitis,  CPAP auto 12-16/ [facility]   Replaced in 2020 Body weight today- 162 lbs Download compliance 73%, AHI 1.2/ hr   Short nights Using CPAP every night, but some nights are short- discussed.  Option to refer for mask fitting and to buy supplies on-line as discussed. Covid vax- 2 [employer]  10/12/20- 75 year old male never smoker followed for OSA, Insomnia complicated by CAD, GERD, Allergic Rhinitis, MI/ CAD,  -Sonata 10, Melatonin 5, Flonase,  CPAP auto 12-16/ [facility]   Replaced in 2020 Body weight today- Covid vax-4 [employer] Download- compliance 3%, AHI 0.9/ hr     -----Heart attack 01/2020, being managed on medications.  Difficulty with cpap, has used full face mask and nasal mask.  Stopped using machine. Mask leaks with FFM and nasal mask. Low back pain. He had recently felt anxious about MI, restless, waking frequently. Mask kept getting loose so  he tried without. Gets up when he can't sleep. Discussed alternatives to CPAP. Discussed advisability of treating OSA given recent cardiac history. He would be interested in trying an oral appliance, with added O2 if needed. Also- he wakes during night after sonata wears off. Discussed trying trazodone with longer half-life. CXR 1V- 02/14/20-  IMPRESSION: No acute cardiopulmonary disease.  ROS-see HPI   + = positi Constitutional:    weight loss, night sweats, fevers, chills, fatigue, lassitude. HEENT:    headaches, difficulty swallowing, tooth/dental problems, sore throat,       sneezing, itching, ear ache, + nasal congestion, post nasal drip, snoring CV:    chest pain, orthopnea, PND, swelling in lower extremities,  anasarca,                                                       dizziness, palpitations Resp:   shortness of breath with exertion or at rest.                productive cough,   non-productive cough, coughing up of blood.              change in color of mucus.  wheezing.   Skin:    rash or lesions. GI:  No-   heartburn, indigestion, abdominal pain, nausea, vomiting, diarrhea,                 change in bowel habits, loss of appetite GU: dysuria, change in color of urine, no urgency or frequency.   flank pain. MS:   joint pain, stiffness, decreased range of motion, back pain. Neuro-     nothing unusual Psych:  change in mood or affect.  depression or anxiety.   memory loss.  OBJ- Physical Exam   not obese General- Alert, Oriented, Affect-appropriate, Distress- none acute, not obese Skin- rash-none, lesions- none, excoriation- none Lymphadenopathy- none Head- atraumatic            Eyes- Gross vision intact, PERRLA, conjunctivae and secretions clear            Ears- Hearing, canals-normal            Nose- Clear, no-Septal dev, mucus, polyps, erosion, perforation  Throat- Mallampati II , mucosa clear , drainage- none, tonsils- atrophic Neck- flexible , trachea midline, no stridor , thyroid nl, carotid no bruit Chest - symmetrical excursion , unlabored           Heart/CV- RRR , no murmur , no gallop  , no rub, nl s1 s2                           - JVD- none , edema- none, stasis changes- none, varices- none           Lung- clear to P&A, wheeze- none, cough- none , dullness-none, rub- none           Chest wall-  Abd-  Br/ Gen/ Rectal- Not done, not indicated Extrem- cyanosis- none, clubbing, none, atrophy- none, strength- nl Neuro- grossly intact to observation

## 2020-10-12 NOTE — Patient Instructions (Signed)
Script sent to try trazodone instead of sonata at bedtime as needed for sleep  I do suggest you go back to trying CPAP with whichever mask style works best for you.  Order- referral to Augustina Mood, DDS    consider oral appliance for OSA  Please cal as needed

## 2020-10-14 ENCOUNTER — Other Ambulatory Visit: Payer: Self-pay

## 2020-10-15 ENCOUNTER — Encounter: Payer: Self-pay | Admitting: Internal Medicine

## 2020-10-15 NOTE — Assessment & Plan Note (Signed)
He is going to try nasal or nasal pillows mask with his CAP to see if that works better with less leak. Plan- also refer to consideration of oral appliance to treat OSA. If he tries that, we can get overnight oximetry with OAP to ensure adequate sleep oxygenation given his cardiac history.

## 2020-10-15 NOTE — Assessment & Plan Note (Signed)
Short acting sonata is wearing off too soon. Plan try trazodone for comparison.

## 2020-12-07 NOTE — Telephone Encounter (Signed)
Hello Dr. Annamaria Boots, please advise on mychart message below. Thanks!  Dr Toy Cookey expects my mouth appliance to arrive in about 3 weeks. She expects to take about a month to make initial adjustments.    With that in mind, I am wondering if my currently scheduled follow up visit should be pushed out. Would November be better?

## 2020-12-08 NOTE — Telephone Encounter (Signed)
Fine to change appointment so we can see him back after he has had his oral appliance for a month or two.

## 2021-01-25 ENCOUNTER — Other Ambulatory Visit: Payer: Self-pay | Admitting: Physician Assistant

## 2021-02-11 ENCOUNTER — Ambulatory Visit: Payer: Medicare HMO | Admitting: Internal Medicine

## 2021-04-21 IMAGING — DX DG CHEST 1V PORT
1 series · 1 of 1 positions shown · non-contrast
Comparison: 11/14/2004

CLINICAL DATA: Chest pain.  Code STEMI.

EXAM:
PORTABLE CHEST - 1 VIEW

[chest ap]
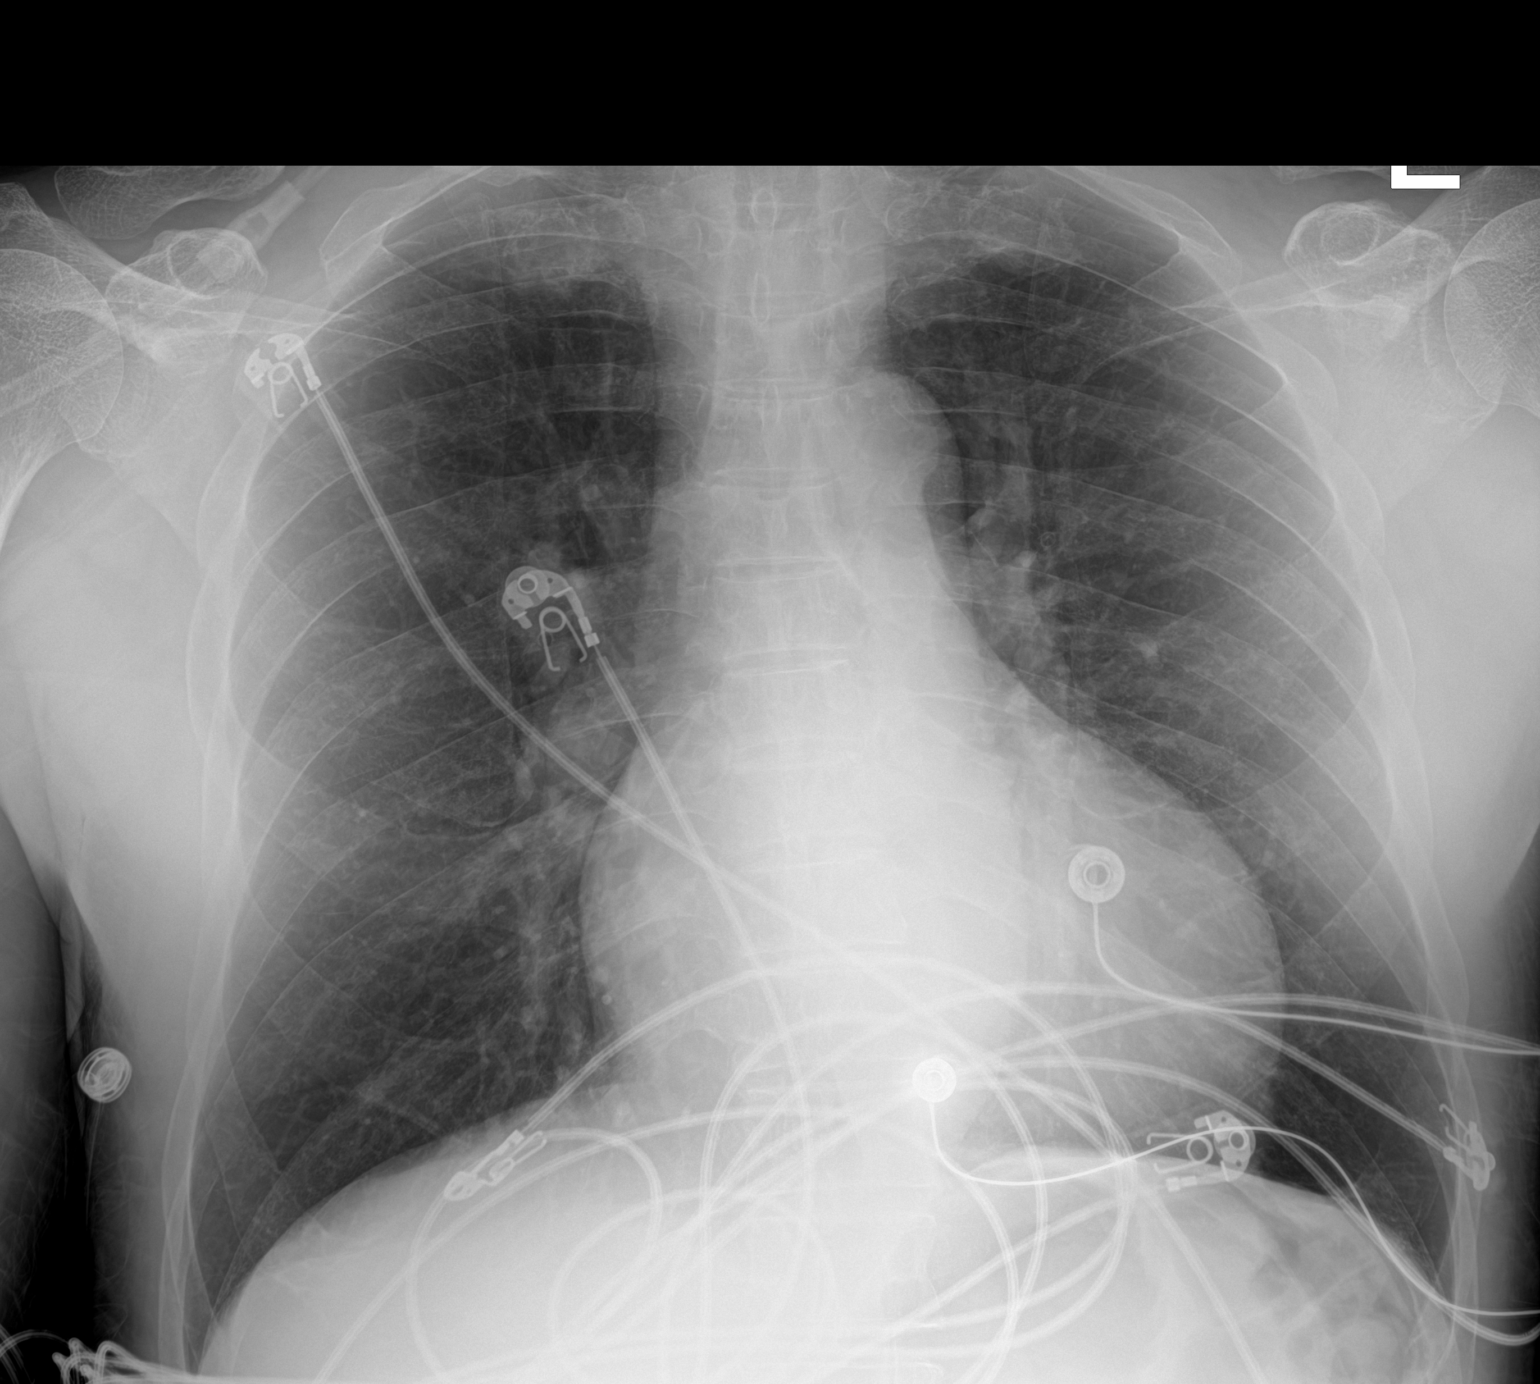

[1 of 1 positions shown; findings below may reference images not displayed]

FINDINGS: Lungs clear.

Heart size and mediastinal contours are within normal limits.

No effusion.  No pneumothorax.

Visualized bones unremarkable.
IMPRESSION: No acute cardiopulmonary disease.

## 2021-06-06 ENCOUNTER — Encounter: Payer: Self-pay | Admitting: Internal Medicine

## 2021-06-07 NOTE — Telephone Encounter (Signed)
See pt email from 11/2020.   Per Dr. Annamaria Boots: "Fine to change appointment so we can see him back after he has had his oral appliance for a month or two."

## 2021-06-21 ENCOUNTER — Encounter: Payer: Self-pay | Admitting: Internal Medicine

## 2021-06-21 NOTE — Telephone Encounter (Signed)
Dr. Annamaria Boots please advise on patient mychart message  Dr Toy Cookey wants to know if the party providing the sleep study has someone who knows how to adjust the oral appliance.  In the event you don't, she is available to be the one to spend the night and make the adjustments.   Please advise

## 2021-06-24 NOTE — Telephone Encounter (Signed)
Dr Toy Cookey may want to talk to the lead daytime sleep technician at the [facility] [phone] ?About monitoring to adjust an oral appliance. ?

## 2021-07-14 ENCOUNTER — Telehealth: Payer: Self-pay | Admitting: Internal Medicine

## 2021-07-14 DIAGNOSIS — G4733 Obstructive sleep apnea (adult) (pediatric): Secondary | ICD-10-CM

## 2021-07-14 DIAGNOSIS — I2510 Atherosclerotic heart disease of native coronary artery without angina pectoris: Secondary | ICD-10-CM

## 2021-07-14 DIAGNOSIS — I252 Old myocardial infarction: Secondary | ICD-10-CM

## 2021-07-14 NOTE — Telephone Encounter (Signed)
Called and spoke with pt. Pt is requesting to repeat the sleep study since it has been at least 5-6 years since the last one he did. Dr. Annamaria Boots, please advise on this for pt. ?

## 2021-07-15 NOTE — Telephone Encounter (Signed)
Order- split night sleep study    dx OSA, CAD, History of MI ?

## 2021-07-15 NOTE — Telephone Encounter (Signed)
Called and spoke with pt letting him know that Dr. Annamaria Boots was fine with [place] placing the order for him to have the sleep study repeated and he verbalized understanding. ? ?Pt said that Dr. Augustina Mood will need to be contacted when getting the sleep study scheduled to get dates that she would be available to be there with him during the study to adjust his oral appliance if it needed to be done during the study. Documented that info in the order. ? ?Nothing further needed. ?

## 2021-07-19 ENCOUNTER — Ambulatory Visit: Payer: Medicare HMO | Admitting: Internal Medicine

## 2021-08-25 ENCOUNTER — Ambulatory Visit (HOSPITAL_BASED_OUTPATIENT_CLINIC_OR_DEPARTMENT_OTHER): Payer: Medicare HMO | Attending: Internal Medicine | Admitting: Internal Medicine

## 2021-08-25 VITALS — Ht 67.5 in | Wt 160.0 lb

## 2021-08-25 DIAGNOSIS — I2510 Atherosclerotic heart disease of native coronary artery without angina pectoris: Secondary | ICD-10-CM | POA: Insufficient documentation

## 2021-08-25 DIAGNOSIS — G4733 Obstructive sleep apnea (adult) (pediatric): Secondary | ICD-10-CM | POA: Insufficient documentation

## 2021-08-25 DIAGNOSIS — I252 Old myocardial infarction: Secondary | ICD-10-CM | POA: Diagnosis not present

## 2021-09-03 DIAGNOSIS — G4733 Obstructive sleep apnea (adult) (pediatric): Secondary | ICD-10-CM

## 2021-09-03 NOTE — Procedures (Signed)
? ? ?  Patient Name: Alan Wall, Alan Wall ?Study Date: 08/25/2021 ?Gender: Male ?D.O.B: 23-Oct-1945 ?Age (years): 69 ?Referring Provider: Baird Lyons MD, ABSM ?Height (inches): 68 ?Interpreting Physician: Baird Lyons MD, ABSM ?Weight (lbs): 160 ?RPSGT: Zadie Rhine ?BMI: 25 ?MRN: [id number] ?Neck Size: 14.00 ? ?CLINICAL INFORMATION ?Sleep Study Type: NPSG ?Indication for sleep study: OSA, Snoring ?Epworth Sleepiness Score: 9 ? ?SLEEP STUDY TECHNIQUE ?As per the AASM Manual for the Scoring of Sleep and Associated Events v2.3 (April 2016) with a hypopnea requiring 4% desaturations. ? ?The channels recorded and monitored were frontal, central and occipital EEG, electrooculogram (EOG), submentalis EMG (chin), nasal and oral airflow, thoracic and abdominal wall motion, anterior tibialis EMG, snore microphone, electrocardiogram, and pulse oximetry. ? ?MEDICATIONS ?Medications self-administered by patient taken the night of the study : none reported ? ?SLEEP ARCHITECTURE ?The study was initiated at 10:10:14 PM and ended at 4:35:26 AM. ? ?Sleep onset time was 40.7 minutes and the sleep efficiency was 16.6%%. The total sleep time was 64 minutes. ? ?Stage REM latency was 213.0 minutes. ? ?The patient spent 21.9%% of the night in stage N1 sleep, 68.0%% in stage N2 sleep, 0.0%% in stage N3 and 10.2% in REM. ? ?Alpha intrusion was absent. ? ?Supine sleep was 15.63%. ? ?RESPIRATORY PARAMETERS ?The overall apnea/hypopnea index (AHI) was 26.3 per hour. There were 3 total apneas, including 3 obstructive, 0 central and 0 mixed apneas. There were 25 hypopneas and 12 RERAs. ? ?The AHI during Stage REM sleep was 55.4 per hour. ? ?AHI while supine was 54.0 per hour. ? ?The mean oxygen saturation was 91.6%. The minimum SpO2 during sleep was 86.0%. ? ?soft snoring was noted during this study. ? ?CARDIAC DATA ?The 2 lead EKG demonstrated sinus rhythm. The mean heart rate was 59.6 beats per minute. Other EKG findings include: PVCs.. ? ?LEG MOVEMENT  DATA ?The total PLMS were 0 with a resulting PLMS index of 0.0. Associated arousal with leg movement index was 0.0 . ? ?IMPRESSIONS ?- Moderate obstructive sleep apnea occurred during this study (AHI = 26.3/h). ?- Mild oxygen desaturation was noted during this study (Min O2 = 86.0%). Mean O2 saturation 91.6%. Time with O2 saturation 88% or less was 2 minutes. ?- The patient snored with soft snoring volume. ?- Oral appliance was adjusted several times by Provider. Patient had difficulty regaining sleep after midnight. ?- PVCs noted. ?- Clinically significant periodic limb movements did not occur during sleep. No significant associated arousals. ? ?DIAGNOSIS ?- Obstructive Sleep Apnea (G47.33) ? ?RECOMMENDATIONS ?- Manage oral appliance or consider for alternatives, based on clinical judmgent. ?- Be careful with alcohol, sedatives and other CNS depressants that may worsen sleep apnea and disrupt normal sleep architecture. ?- Sleep hygiene should be reviewed to assess factors that may improve sleep quality. ?- Weight management and regular exercise should be initiated or continued if appropriate. ? ?[Electronically signed] 09/03/2021 01:23 PM ? ?Baird Lyons MD, ABSM ?Diplomate, Tax adviser of Sleep Medicine ?NPI: [id number] ? ?  ? ? ? ? ? ? ? ? ? ? ? ? ? ? ? ? ? ? ? ? ? ?[name] ?Diplomate, Tax adviser of Sleep Medicine ? ?ELECTRONICALLY SIGNED ON:  09/03/2021, 1:14 PM ?[place] ?PH: [phone]   FX: [phone] ?ACCREDITED BY THE AMERICAN ACADEMY OF SLEEP MEDICINE ?

## 2021-10-23 ENCOUNTER — Emergency Department (HOSPITAL_BASED_OUTPATIENT_CLINIC_OR_DEPARTMENT_OTHER)
Admission: EM | Admit: 2021-10-23 | Discharge: 2021-10-23 | Disposition: A | Discharge status: Home / Self Care | Payer: Medicare HMO | Attending: Emergency Medicine | Admitting: Emergency Medicine

## 2021-10-23 ENCOUNTER — Other Ambulatory Visit: Payer: Self-pay

## 2021-10-23 DIAGNOSIS — T63441A Toxic effect of venom of bees, accidental (unintentional), initial encounter: Secondary | ICD-10-CM | POA: Insufficient documentation

## 2021-10-23 MED ORDER — DIPHENHYDRAMINE HCL 25 MG PO TABS: 25.0000 mg | ORAL_TABLET | Freq: Four times a day (QID) | ORAL | 0 refills | Status: AC | PRN

## 2021-10-23 MED ORDER — EPINEPHRINE 0.3 MG/0.3ML IJ SOAJ: 0.3000 mg | INTRAMUSCULAR | 2 refills | Status: AC | PRN

## 2021-10-23 NOTE — ED Triage Notes (Signed)
Patient arrives with complaints of bee sting to left hand. Patient has a of history of increased localized swelling to site, but does not have epi-pen.   No shortness of breath of facial swelling.

## 2021-10-23 NOTE — ED Provider Notes (Signed)
[facility] Provider Note   CSN: [id number] Arrival date & time: 10/23/21  1548     History  Chief Complaint  Patient presents with   Insect Bite    Bee Sting, Left Hand    Alan Wall is a 76 y.o. male presenting to the ED with a bee sting to the left hand.  This occurred around 2 PM today.  He reports in the past and has been stung by bee is a significant swelling around the site, and has noted some swelling around his second knuckle, but has not gotten any worse.  He denies hives.  Denies swelling of the tongue or throat or difficulty breathing.  He feels fine otherwise.  He is requesting a refill on his EpiPen that he has had before.  He did not give himself any medications prior to arrival  HPI     Home Medications Prior to Admission medications   Medication Sig Start Date End Date Taking? Authorizing Provider  Cholecalciferol (VITAMIN D3) 50 MCG (2000 UT) TABS Take 2,000 Units by mouth daily.     [provider]  clopidogrel (PLAVIX) 75 MG tablet Take 1 tablet (75 mg total) by mouth daily. 12/22/19   Richardson Dopp T, PA-C  fluticasone (FLONASE) 50 MCG/ACT nasal spray Place 2 sprays into both nostrils daily as needed for allergies or rhinitis.     [provider]  isosorbide mononitrate (IMDUR) 30 MG 24 hr tablet Take 1 tablet (30 mg total) by mouth daily. 12/05/19   Duke, Tami Lin, PA  lisinopril (ZESTRIL) 2.5 MG tablet Take 1 tablet (2.5 mg total) by mouth daily. 02/02/20   Richardson Dopp T, PA-C  loratadine (CLARITIN) 10 MG tablet Take 10 mg by mouth daily as needed for allergies.    [provider]  melatonin 5 MG TABS Take 5-10 mg by mouth at bedtime as needed (sleep).     [provider]  metoprolol succinate (TOPROL-XL) 25 MG 24 hr tablet Take 1 tablet (25 mg total) by mouth daily. Patient taking differently: Take 25 mg by mouth at bedtime. 12/05/19   Duke, Tami Lin, PA  nitroGLYCERIN  (NITROSTAT) 0.4 MG SL tablet Place 1 tablet (0.4 mg total) under the tongue every 5 (five) minutes as needed for chest pain. 12/04/19 12/03/20  Martinique, Peter M, MD  rosuvastatin (CRESTOR) 40 MG tablet Take 1 tablet (40 mg total) by mouth daily. Patient taking differently: Take 40 mg by mouth at bedtime. 12/05/19   Ledora Bottcher, PA  traZODone (DESYREL) 50 MG tablet 1 at bedtime as needed for sleep 10/12/20   Baird Lyons D, MD  zaleplon (SONATA) 5 MG capsule Take 5-10 mg by mouth at bedtime as needed for sleep.  09/19/19   [provider]      Allergies    Bee venom and Other    Review of Systems   Review of Systems  Physical Exam Updated Vital Signs BP 135/86 (BP Location: Right Arm)   Pulse (!) 40   Temp 98.2 F (36.8 C)   Resp 18   Ht '5\' 8"'$  (1.727 m)   Wt 72.6 kg   SpO2 100%   BMI 24.33 kg/m  Physical Exam Constitutional:      General: He is not in acute distress. HENT:     Head: Normocephalic and atraumatic.     Comments: Oropharynx non-erythematous.  No drooling. No brawny edema. Voice is not muffled.  Eyes:     Conjunctiva/sclera:  Conjunctivae normal.     Pupils: Pupils are equal, round, and reactive to light.  Cardiovascular:     Rate and Rhythm: Normal rate and regular rhythm.  Pulmonary:     Effort: Pulmonary effort is normal. No respiratory distress.  Musculoskeletal:     Comments: Erythema with mild edema of the second finger near the proximal joint, full range of motion of the fingers.  No hives noted  Skin:    General: Skin is warm and dry.  Neurological:     General: No focal deficit present.     Mental Status: He is alert. Mental status is at baseline.  Psychiatric:        Mood and Affect: Mood normal.        Behavior: Behavior normal.     ED Results / Procedures / Treatments   Labs (all labs ordered are listed, but only abnormal results are displayed) Labs Reviewed - No data to display  EKG None  Radiology No results  found.  Procedures Procedures    Medications Ordered in ED Medications - No data to display  ED Course/ Medical Decision Making/ A&P                           Medical Decision Making  Patient is here with a localized reaction to a bee sting.  No evidence of anaphylaxis.  I do not believe he is any medications at this time.  I think he is reasonably safe and stable for discharge.  I recommended Benadryl as needed at home if he has continued swelling or itching around the site.  I will refill his EpiPen, we discussed indications for using EpiPen, specifically for anaphylaxis, or airway swelling or changes.  He verbalized understanding.        Final Clinical Impression(s) / ED Diagnoses Final diagnoses:  Bee sting, accidental or unintentional, initial encounter    Rx / DC Orders ED Discharge Orders     None         Jermari Tamargo, Carola Rhine, MD 10/23/21 1654

## 2021-10-23 NOTE — Discharge Instructions (Addendum)
You should use your EpiPen if you are having hives diffusely across her body, or any tightening, scratchiness, difficulty breathing or swelling in your airway or tongue.  If you are needing to use the EpiPen, you should return to the ER immediately.  You should also take Benadryl as needed every 6 hours for itching and swelling.  You can place ice on the injury today.

## 2021-12-21 ENCOUNTER — Other Ambulatory Visit: Payer: Self-pay | Admitting: Internal Medicine

## 2021-12-22 NOTE — Telephone Encounter (Signed)
Please advise on med refill.  Allergies  Allergen Reactions   Bee Venom Swelling    Swelling at injection site   Other Itching    Reactions to cats and cat dander - itching      Current Outpatient Medications:    Cholecalciferol (VITAMIN D3) 50 MCG (2000 UT) TABS, Take 2,000 Units by mouth daily. , Disp: , Rfl:    clopidogrel (PLAVIX) 75 MG tablet, Take 1 tablet (75 mg total) by mouth daily., Disp: 90 tablet, Rfl: 3   diphenhydrAMINE (BENADRYL) 25 MG tablet, Take 1 tablet (25 mg total) by mouth every 6 (six) hours as needed for up to 15 doses for itching or allergies., Disp: 15 tablet, Rfl: 0   EPINEPHrine 0.3 mg/0.3 mL IJ SOAJ injection, Inject 0.3 mg into the muscle as needed for anaphylaxis., Disp: 1 each, Rfl: 2   fluticasone (FLONASE) 50 MCG/ACT nasal spray, Place 2 sprays into both nostrils daily as needed for allergies or rhinitis. , Disp: , Rfl:    isosorbide mononitrate (IMDUR) 30 MG 24 hr tablet, Take 1 tablet (30 mg total) by mouth daily., Disp: 90 tablet, Rfl: 3   lisinopril (ZESTRIL) 2.5 MG tablet, Take 1 tablet (2.5 mg total) by mouth daily., Disp: 90 tablet, Rfl: 3   loratadine (CLARITIN) 10 MG tablet, Take 10 mg by mouth daily as needed for allergies., Disp: , Rfl:    melatonin 5 MG TABS, Take 5-10 mg by mouth at bedtime as needed (sleep). , Disp: , Rfl:    metoprolol succinate (TOPROL-XL) 25 MG 24 hr tablet, Take 1 tablet (25 mg total) by mouth daily. (Patient taking differently: Take 25 mg by mouth at bedtime.), Disp: 90 tablet, Rfl: 3   nitroGLYCERIN (NITROSTAT) 0.4 MG SL tablet, Place 1 tablet (0.4 mg total) under the tongue every 5 (five) minutes as needed for chest pain., Disp: 25 tablet, Rfl: 2   rosuvastatin (CRESTOR) 40 MG tablet, Take 1 tablet (40 mg total) by mouth daily. (Patient taking differently: Take 40 mg by mouth at bedtime.), Disp: 90 tablet, Rfl: 3   traZODone (DESYREL) 50 MG tablet, 1 at bedtime as needed for sleep, Disp: 30 tablet, Rfl: 5   zaleplon  (SONATA) 5 MG capsule, Take 5-10 mg by mouth at bedtime as needed for sleep. , Disp: , Rfl:

## 2021-12-22 NOTE — Telephone Encounter (Signed)
Trazodone refilled.

## 2022-06-24 ENCOUNTER — Other Ambulatory Visit: Payer: Self-pay | Admitting: Cardiology

## 2022-07-09 ENCOUNTER — Other Ambulatory Visit: Payer: Self-pay | Admitting: Internal Medicine

## 2022-07-11 NOTE — Telephone Encounter (Signed)
Trazodone refilled.

## 2022-07-11 NOTE — Telephone Encounter (Signed)
Please advise on refill request   Allergies  Allergen Reactions   Bee Venom Swelling    Swelling at injection site   Other Itching    Reactions to cats and cat dander - itching      Current Outpatient Medications:    Cholecalciferol (VITAMIN D3) 50 MCG (2000 UT) TABS, Take 2,000 Units by mouth daily. , Disp: , Rfl:    clopidogrel (PLAVIX) 75 MG tablet, Take 1 tablet (75 mg total) by mouth daily., Disp: 90 tablet, Rfl: 3   diphenhydrAMINE (BENADRYL) 25 MG tablet, Take 1 tablet (25 mg total) by mouth every 6 (six) hours as needed for up to 15 doses for itching or allergies., Disp: 15 tablet, Rfl: 0   EPINEPHrine 0.3 mg/0.3 mL IJ SOAJ injection, Inject 0.3 mg into the muscle as needed for anaphylaxis., Disp: 1 each, Rfl: 2   fluticasone (FLONASE) 50 MCG/ACT nasal spray, Place 2 sprays into both nostrils daily as needed for allergies or rhinitis. , Disp: , Rfl:    isosorbide mononitrate (IMDUR) 30 MG 24 hr tablet, Take 1 tablet (30 mg total) by mouth daily., Disp: 90 tablet, Rfl: 3   lisinopril (ZESTRIL) 2.5 MG tablet, Take 1 tablet (2.5 mg total) by mouth daily., Disp: 90 tablet, Rfl: 3   loratadine (CLARITIN) 10 MG tablet, Take 10 mg by mouth daily as needed for allergies., Disp: , Rfl:    melatonin 5 MG TABS, Take 5-10 mg by mouth at bedtime as needed (sleep). , Disp: , Rfl:    metoprolol succinate (TOPROL-XL) 25 MG 24 hr tablet, Take 1 tablet (25 mg total) by mouth daily. (Patient taking differently: Take 25 mg by mouth at bedtime.), Disp: 90 tablet, Rfl: 3   nitroGLYCERIN (NITROSTAT) 0.4 MG SL tablet, PLACE 1 TABLET (0.4 MG TOTAL) UNDER THE TONGUE EVERY  5 (FIVE)  MINUTES  AS  NEEDED  FOR  CHEST  PAIN, Disp: 25 tablet, Rfl: 0   rosuvastatin (CRESTOR) 40 MG tablet, Take 1 tablet (40 mg total) by mouth daily. (Patient taking differently: Take 40 mg by mouth at bedtime.), Disp: 90 tablet, Rfl: 3   traZODone (DESYREL) 50 MG tablet, TAKE 1 TABLET BY MOUTH AT BEDTIME AS NEEDED FOR SLEEP, Disp: 30  tablet, Rfl: 5   zaleplon (SONATA) 5 MG capsule, Take 5-10 mg by mouth at bedtime as needed for sleep. , Disp: , Rfl:

## 2022-11-24 ENCOUNTER — Ambulatory Visit: Payer: Medicare HMO | Admitting: Internal Medicine

## 2023-01-25 ENCOUNTER — Ambulatory Visit (INDEPENDENT_AMBULATORY_CARE_PROVIDER_SITE_OTHER): Payer: Medicare HMO

## 2023-01-25 ENCOUNTER — Ambulatory Visit: Payer: Medicare HMO | Attending: Cardiovascular Disease | Admitting: Cardiovascular Disease

## 2023-01-25 ENCOUNTER — Encounter: Payer: Self-pay | Admitting: Cardiovascular Disease

## 2023-01-25 VITALS — BP 118/64 | HR 56 | Ht 68.0 in | Wt 146.0 lb

## 2023-01-25 DIAGNOSIS — I25810 Atherosclerosis of coronary artery bypass graft(s) without angina pectoris: Secondary | ICD-10-CM

## 2023-01-25 DIAGNOSIS — E782 Mixed hyperlipidemia: Secondary | ICD-10-CM | POA: Diagnosis not present

## 2023-01-25 DIAGNOSIS — R42 Dizziness and giddiness: Secondary | ICD-10-CM

## 2023-01-25 DIAGNOSIS — R001 Bradycardia, unspecified: Secondary | ICD-10-CM

## 2023-01-25 MED ORDER — ASPIRIN 81 MG PO TBEC: 81.0000 mg | DELAYED_RELEASE_TABLET | Freq: Every day | ORAL | Status: AC

## 2023-01-25 NOTE — Progress Notes (Unsigned)
Applied a 3 day Zio XT monitor to patient in the office 

## 2023-01-25 NOTE — Patient Instructions (Signed)
Medication Instructions:  CONTINUE Aspirin 81mg  daily *If you need a refill on your cardiac medications before your next appointment, please call your pharmacy*  Lab Work: NONE If you have labs (blood work) drawn today and your tests are completely normal, you will receive your results only by: MyChart Message (if you have MyChart) OR A paper copy in the mail If you have any lab test that is abnormal or we need to change your treatment, we will call you to review the results.  Testing/Procedures: 3-day zio heart monitor Your physician has recommended that you wear an event monitor. Event monitors are medical devices that record the heart's electrical activity. Doctors most often [place] these monitors to diagnose arrhythmias. Arrhythmias are problems with the speed or rhythm of the heartbeat. The monitor is a small, portable device. You can wear one while you do your normal daily activities. This is usually used to diagnose what is causing palpitations/syncope (passing out).  Follow-Up: At [facility], you and your health needs are our priority.  As part of our continuing mission to provide you with exceptional heart care, we have created designated Provider Care Teams.  These Care Teams include your primary Cardiologist (physician) and Advanced Practice Providers (APPs -  Physician Assistants and Nurse Practitioners) who all work together to provide you with the care you need, when you need it.  Your next appointment:   1 year(s)  Provider:   Tonny Bollman, MD       Other Instructions Alan Wall- Long Term Monitor Instructions  Your physician has requested you wear a ZIO patch monitor for 3 days.  This is a single patch monitor. [employer] supplies one patch monitor per enrollment. Additional stickers are not available. Please do not apply patch if you will be having a Nuclear Stress Test,  Echocardiogram, Cardiac CT, MRI, or Chest Xray during the period you would be wearing the   monitor. The patch cannot be worn during these tests. You cannot remove and re-apply the  ZIO XT patch monitor.  Your ZIO patch monitor will be mailed 3 day USPS to your address on file. It may take 3-5 days  to receive your monitor after you have been enrolled.  Once you have received your monitor, please review the enclosed instructions. Your monitor  has already been registered assigning a specific monitor serial # to you.  Billing and Patient Assistance Program Information  We have supplied [employer] with any of your insurance information on file for billing purposes. [employer] offers a sliding scale Patient Assistance Program for patients that do not have  insurance, or whose insurance does not completely cover the cost of the ZIO monitor.  You must apply for the Patient Assistance Program to qualify for this discounted rate.  To apply, please call [employer] at [phone], select option 4, select option 2, ask to apply for  Patient Assistance Program. Alan Wall will ask your household income, and how many people  are in your household. They will quote your out-of-pocket cost based on that information.  [employer] will also be able to set up a 37-month, interest-free payment plan if needed.  Applying the monitor   Shave hair from upper left chest.  Hold abrader disc by orange tab. Rub abrader in 40 strokes over the upper left chest as  indicated in your monitor instructions.  Clean area with 4 enclosed alcohol pads. Let dry.  Apply patch as indicated in monitor instructions. Patch will be placed under collarbone on left  side of chest with arrow pointing upward.  Rub patch adhesive wings for 2 minutes. Remove white label marked "1". Remove the white  label marked "2". Rub patch adhesive wings for 2 additional minutes.  While looking in a mirror, press and release button in center of patch. A small green light will  flash 3-4 times. This will be your only indicator that the monitor has been  turned on.  Do not shower for the first 24 hours. You may shower after the first 24 hours.  Press the button if you feel a symptom. You will hear a small click. Record Date, Time and  Symptom in the Patient Logbook.  When you are ready to remove the patch, follow instructions on the last 2 pages of Patient  Logbook. Stick patch monitor onto the last page of Patient Logbook.  Place Patient Logbook in the blue and white box. Use locking tab on box and tape box closed  securely. The blue and white box has prepaid postage on it. Please place it in the mailbox as  soon as possible. Your physician should have your test results approximately 7 days after the  monitor has been mailed back to [facility].  Call [facility] Customer Care at [phone] if you have questions regarding  your ZIO XT patch monitor. Call them immediately if you see an orange light blinking on your  monitor.  If your monitor falls off in less than 4 days, contact our Monitor department at [phone].  If your monitor becomes loose or falls off after 4 days call [employer] at [phone] for  suggestions on securing your monitor

## 2023-01-25 NOTE — Progress Notes (Signed)
Cardiology Office Note:    Date:  01/25/2023   ID:  Alan Wall, DOB [birth year], MRN [id number]  PCP:  Rene Kocher, MD   [facility] Providers Cardiologist:  Tonny Bollman, MD     Referring MD: Margo Common Allayne Butcher, MD   Chief Complaint  Patient presents with   Coronary Artery Disease    History of Present Illness:    Alan Wall is a 77 y.o. male with a hx of CAD, presenting for follow-up evaluation. He has been followed by [facility] cardiology but is relocating to [place].   Lipid panel drawn through the [facility] system Aug 28, 2022 shows cholesterol 160, LDL 69, HDL 71, triglycerides 101.  Labs from August 2 show hemoglobin A1c of 6.0, normal creatinine of 1.3, and normal LFTs with a bilirubin of 1.0, AST 30, ALT 20.  The patient is doing well.  He has occasional dizziness.  He brings in some readings from his Apple watch and it occasionally reports heart rates in the 30s.  His blood pressure readings have been excellent.  He denies chest pain, chest pressure, or shortness of breath.  No edema, orthopnea, or PND.  He is active with yoga and exercise and has no exertional symptoms.  He is tolerating aspirin and clopidogrel with no bleeding problems.  Past Medical History:  Diagnosis Date   Allergy    Food/cat dander/bee stings   Coronary artery disease    Hematoma of leg    Hyperlipidemia    Myocardial infarction (HCC)    S/P dilatation of esophageal stricture    Sleep apnea     Past Surgical History:  Procedure Laterality Date   CARDIAC CATHETERIZATION     CATARACT EXTRACTION W/ INTRAOCULAR LENS  IMPLANT, BILATERAL  2010   CORONARY/GRAFT ACUTE MI REVASCULARIZATION N/A 12/01/2019   Procedure: Coronary/Graft Acute MI Revascularization;  Surgeon: Tonny Bollman, MD;  Location: [facility] INVASIVE CV LAB;  Service: Cardiovascular;  Laterality: N/A;   INGUINAL HERNIA REPAIR Left    LEFT HEART CATH AND CORONARY ANGIOGRAPHY N/A 12/01/2019   Procedure: LEFT HEART  CATH AND CORONARY ANGIOGRAPHY;  Surgeon: Tonny Bollman, MD;  Location: [facility] INVASIVE CV LAB;  Service: Cardiovascular;  Laterality: N/A;    Current Medications: Current Meds  Medication Sig   aspirin EC 81 MG tablet Take 1 tablet (81 mg total) by mouth daily. Swallow whole.   Cholecalciferol (VITAMIN D3) 50 MCG (2000 UT) TABS Take 2,000 Units by mouth daily.    clopidogrel (PLAVIX) 75 MG tablet Take 1 tablet (75 mg total) by mouth daily.   diphenhydrAMINE (BENADRYL) 25 MG tablet Take 1 tablet (25 mg total) by mouth every 6 (six) hours as needed for up to 15 doses for itching or allergies.   EPINEPHrine 0.3 mg/0.3 mL IJ SOAJ injection Inject 0.3 mg into the muscle as needed for anaphylaxis.   fluticasone (FLONASE) 50 MCG/ACT nasal spray Place 2 sprays into both nostrils daily as needed for allergies or rhinitis.    loratadine (CLARITIN) 10 MG tablet Take 10 mg by mouth daily as needed for allergies.   metoprolol succinate (TOPROL-XL) 25 MG 24 hr tablet Take 12.5 mg by mouth daily.   nitroGLYCERIN (NITROSTAT) 0.4 MG SL tablet PLACE 1 TABLET (0.4 MG TOTAL) UNDER THE TONGUE EVERY  5 (FIVE)  MINUTES  AS  NEEDED  FOR  CHEST  PAIN   rosuvastatin (CRESTOR) 40 MG tablet Take 1 tablet (40 mg total) by mouth daily. (Patient taking differently: Take 40  mg by mouth at bedtime.)   traZODone (DESYREL) 50 MG tablet TAKE 1 TABLET BY MOUTH AT BEDTIME AS NEEDED FOR SLEEP   [DISCONTINUED] lisinopril (ZESTRIL) 2.5 MG tablet Take 1 tablet (2.5 mg total) by mouth daily. (Patient taking differently: Take 5 mg by mouth daily.)   [DISCONTINUED] metoprolol succinate (TOPROL-XL) 25 MG 24 hr tablet Take 1 tablet (25 mg total) by mouth daily. (Patient taking differently: Take 12.5 mg by mouth at bedtime.)     Allergies:   Bee venom and Other   Social History   Socioeconomic History   Marital status: Married    Spouse name: Not on file   Number of children: 0   Years of education: 20   Highest education level:  Master's degree (e.g., MA, MS, MEng, MEd, MSW, MBA)  Occupational History    Employer: [employer]  Tobacco Use   Smoking status: Never   Smokeless tobacco: Never  Vaping Use   Vaping status: Never Used  Substance and Sexual Activity   Alcohol use: Yes    Alcohol/week: 2.0 standard drinks of alcohol    Types: 2 Glasses of wine per week    Comment: 2 drinks per week   Drug use: No   Sexual activity: Yes  Other Topics Concern   Not on file  Social History Narrative   Regular Exercise- yes, bicycle   Social Determinants of Health   Financial Resource Strain: Not on file  Food Insecurity: Not on file  Transportation Needs: Not on file  Physical Activity: Not on file  Stress: Not on file  Social Connections: Not on file     Family History: The patient's family history includes Coronary artery disease in an other family member; Heart disease in his brother and father; Prostate cancer in his paternal uncle.  ROS:   Please see the history of present illness.    All other systems reviewed and are negative.  EKGs/Labs/Other Studies Reviewed:    The following studies were reviewed today: 2D echocardiogram 12/02/2019: 1. Left ventricular ejection fraction, by estimation, is 45 to 50%. The  left ventricle has mildly decreased function. The left ventricle has no  regional wall motion abnormalities. Left ventricular diastolic parameters  were normal. There is severe  hypokinesis of the left ventricular, basal inferior wall, inferoseptal  wall and inferolateral wall.   2. Right ventricular systolic function is normal. The right ventricular  size is normal. There is normal pulmonary artery systolic pressure. The  estimated right ventricular systolic pressure is 29.8 mmHg.   3. The mitral valve is normal in structure. Mild to moderate mitral valve  regurgitation. No evidence of mitral stenosis.   4. The aortic valve is normal in structure. Aortic valve regurgitation is  not  visualized. No aortic stenosis is present.   5. The inferior vena cava is normal in size with greater than 50%  respiratory variability, suggesting right atrial pressure of 3 mmHg.  EKG Interpretation Date/Time:  Thursday January 25 2023 09:37:57 EDT Ventricular Rate:  56 PR Interval:  172 QRS Duration:  88 QT Interval:  442 QTC Calculation: 426 R Axis:   40  Text Interpretation: Sinus bradycardia with frequent Premature ventricular complexes Inferior infarct , age undetermined When compared with ECG of 14-Feb-2020 13:27, PREVIOUS ECG IS PRESENT PVC's are now present Confirmed by Tonny Bollman [phone]) on 01/25/2023 10:02:55 AM   Cardiac catheterization 12/01/2019: Conclusion    Balloon angioplasty was performed using a BALLOON SAPPHIRE 2.0X15. Post intervention, there  is a 100% residual stenosis. Balloon angioplasty was performed. Post intervention, there is a 80% residual stenosis.   1.  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  Patency of the left main 4.  Patency of the LAD with mild nonobstructive disease 5.  Normal LVEDP 6.  Unsuccessful PCI of the RCA subbranches secondary to residual occlusion of the PLA branch and residual severe stenosis of the PDA branch, vessels not candidates for stenting because of slow overall flow and severe ectasia   Recommend: aggrastat x 18 hours, aggressive risk reduction measures, ASA/ticagrelor x 12 months (ACS Class 1 recommendation) consider long-term if tolerated with marked coronary ectasia  Recent Labs: No results found for requested labs within last 365 days.  Recent Lipid Panel    Component Value Date/Time   CHOL 118 02/14/2020 1330   TRIG 55 02/14/2020 1330   HDL 56 02/14/2020 1330   CHOLHDL 2.1 02/14/2020 1330   VLDL 11 02/14/2020 1330   LDLCALC 51  02/14/2020 1330   LDLDIRECT 125.2 06/12/2012 0808     Risk Assessment/Calculations:                Physical Exam:    VS:  BP 118/64   Pulse (!) 56   Ht 5\' 8"  (1.727 m)   Wt 146 lb (66.2 kg)   SpO2 98%   BMI 22.20 kg/m     Wt Readings from Last 3 Encounters:  01/25/23 146 lb (66.2 kg)  10/23/21 160 lb (72.6 kg)  08/25/21 160 lb (72.6 kg)     GEN:  Well nourished, well developed in no acute distress HEENT: Normal NECK: No JVD; No carotid bruits LYMPHATICS: No lymphadenopathy CARDIAC: RRR, no murmurs, rubs, gallops RESPIRATORY:  Clear to auscultation without rales, wheezing or rhonchi  ABDOMEN: Soft, non-tender, non-distended MUSCULOSKELETAL:  No edema; No deformity  SKIN: Warm and dry NEUROLOGIC:  Alert and oriented x 3 PSYCHIATRIC:  Normal affect   ASSESSMENT:    1. Coronary artery disease involving coronary bypass graft of native heart without angina pectoris   2. Mixed hyperlipidemia   3. Bradycardia   4. Dizziness    PLAN:    In order of problems listed above:  Stable with no symptoms of angina present.  He continues on aspirin, clopidogrel, and ACE inhibitor, beta-blocker, and high intensity statin drug.  I did not make any changes today. Treated with rosuvastatin 40 mg daily.  I reviewed his recent labs from [facility] and his LDL cholesterol is 69. Patient with bradycardia and PVCs.  Complains of some dizziness.  I have recommended a 3-day ZIO monitor for further evaluation.     Medication Adjustments/Labs and Tests Ordered: Current medicines are reviewed at length with the patient today.  Concerns regarding medicines are outlined above.  Orders Placed This Encounter  Procedures   LONG TERM MONITOR (3-14 DAYS)   EKG 12-Lead   Meds ordered this encounter  Medications   aspirin EC 81 MG tablet    Sig: Take 1 tablet (81 mg total) by mouth daily. Swallow whole.    Patient Instructions  Medication Instructions:  CONTINUE Aspirin 81mg  daily *If you  need a refill on your cardiac medications before your next appointment, please call your pharmacy*  Lab Work: NONE If you have labs (blood work) drawn today and your  tests are completely normal, you will receive your results only by: MyChart Message (if you have MyChart) OR A paper copy in the mail If you have any lab test that is abnormal or we need to change your treatment, we will call you to review the results.  Testing/Procedures: 3-day zio heart monitor Your physician has recommended that you wear an event monitor. Event monitors are medical devices that record the heart's electrical activity. Doctors most often [place] these monitors to diagnose arrhythmias. Arrhythmias are problems with the speed or rhythm of the heartbeat. The monitor is a small, portable device. You can wear one while you do your normal daily activities. This is usually used to diagnose what is causing palpitations/syncope (passing out).  Follow-Up: At [facility], you and your health needs are our priority.  As part of our continuing mission to provide you with exceptional heart care, we have created designated Provider Care Teams.  These Care Teams include your primary Cardiologist (physician) and Advanced Practice Providers (APPs -  Physician Assistants and Nurse Practitioners) who all work together to provide you with the care you need, when you need it.  Your next appointment:   1 year(s)  Provider:   Tonny Bollman, MD       Other Instructions Christena Deem- Long Term Monitor Instructions  Your physician has requested you wear a ZIO patch monitor for 3 days.  This is a single patch monitor. [employer] supplies one patch monitor per enrollment. Additional stickers are not available. Please do not apply patch if you will be having a Nuclear Stress Test,  Echocardiogram, Cardiac CT, MRI, or Chest Xray during the period you would be wearing the  monitor. The patch cannot be worn during these tests. You cannot  remove and re-apply the  ZIO XT patch monitor.  Your ZIO patch monitor will be mailed 3 day USPS to your address on file. It may take 3-5 days  to receive your monitor after you have been enrolled.  Once you have received your monitor, please review the enclosed instructions. Your monitor  has already been registered assigning a specific monitor serial # to you.  Billing and Patient Assistance Program Information  We have supplied [employer] with any of your insurance information on file for billing purposes. [employer] offers a sliding scale Patient Assistance Program for patients that do not have  insurance, or whose insurance does not completely cover the cost of the ZIO monitor.  You must apply for the Patient Assistance Program to qualify for this discounted rate.  To apply, please call [employer] at [phone], select option 4, select option 2, ask to apply for  Patient Assistance Program. Meredeth Ide will ask your household income, and how many people  are in your household. They will quote your out-of-pocket cost based on that information.  [employer] will also be able to set up a 10-month, interest-free payment plan if needed.  Applying the monitor   Shave hair from upper left chest.  Hold abrader disc by orange tab. Rub abrader in 40 strokes over the upper left chest as  indicated in your monitor instructions.  Clean area with 4 enclosed alcohol pads. Let dry.  Apply patch as indicated in monitor instructions. Patch will be placed under collarbone on left  side of chest with arrow pointing upward.  Rub patch adhesive wings for 2 minutes. Remove white label marked "1". Remove the white  label marked "2". Rub patch adhesive wings for 2 additional minutes.  While looking  in a mirror, press and release button in center of patch. A small green light will  flash 3-4 times. This will be your only indicator that the monitor has been turned on.  Do not shower for the first 24 hours. You may shower  after the first 24 hours.  Press the button if you feel a symptom. You will hear a small click. Record Date, Time and  Symptom in the Patient Logbook.  When you are ready to remove the patch, follow instructions on the last 2 pages of Patient  Logbook. Stick patch monitor onto the last page of Patient Logbook.  Place Patient Logbook in the blue and white box. Use locking tab on box and tape box closed  securely. The blue and white box has prepaid postage on it. Please place it in the mailbox as  soon as possible. Your physician should have your test results approximately 7 days after the  monitor has been mailed back to [facility].  Call [facility] Customer Care at [phone] if you have questions regarding  your ZIO XT patch monitor. Call them immediately if you see an orange light blinking on your  monitor.  If your monitor falls off in less than 4 days, contact our Monitor department at [phone].  If your monitor becomes loose or falls off after 4 days call [employer] at [phone] for  suggestions on securing your monitor    Signed, Tonny Bollman, MD  01/25/2023 1:21 PM    [facility]

## 2023-02-04 DIAGNOSIS — R001 Bradycardia, unspecified: Secondary | ICD-10-CM | POA: Diagnosis not present

## 2023-02-04 DIAGNOSIS — R42 Dizziness and giddiness: Secondary | ICD-10-CM | POA: Diagnosis not present

## 2023-02-05 ENCOUNTER — Telehealth: Payer: Self-pay

## 2023-02-05 DIAGNOSIS — I493 Ventricular premature depolarization: Secondary | ICD-10-CM

## 2023-02-05 MED ORDER — METOPROLOL SUCCINATE ER 25 MG PO TB24: 25.0000 mg | ORAL_TABLET | Freq: Every day | ORAL | 3 refills | Status: DC

## 2023-02-05 NOTE — Telephone Encounter (Signed)
-----   Message from Tonny Bollman sent at 02/05/2023 12:33 PM EDT ----- Monitor reviewed.  Discussed results with patient over the telephone.  Recommend increase metoprolol succinate to 25 mg daily.  Recommend referral for EP consultation in this patient with a high PVC burden and inferior wall scar after prior MI and is to make sure that an EP study or other intervention is not indicated.  Patient agreeable with plan.

## 2023-02-05 NOTE — Telephone Encounter (Signed)
Medication sent to pharmacy on file and referral placed to EP at this time.

## 2023-03-15 ENCOUNTER — Ambulatory Visit: Payer: Medicare HMO | Admitting: Cardiovascular Disease

## 2023-03-15 NOTE — Progress Notes (Deleted)
  Electrophysiology Office Note:    Date:  03/15/2023   ID:  Alan Wall, Alan Wall 1945/07/13, MRN [id number]  PCP:  Rene Kocher, MD   [facility] Providers Cardiologist:  Tonny Bollman, MD { Click to update primary MD,subspecialty MD or APP then REFRESH:1}    Referring MD: Tonny Bollman, MD   History of Present Illness:    Alan Wall is a 77 y.o. male with a medical history significant for ***, referred for ***.     I discussed the use of AI scribe software for clinical note transcription with the patient, who gave verbal consent to proceed.  ***     Today, ***  EKGs/Labs/Other Studies Reviewed Today:     Echocardiogram:  *** ***   Monitors:  *** ***  Stress testing:  *** ***  Advanced imaging:  *** ***  Cardiac catherization  *** ***  EKG:         Physical Exam:    VS:  There were no vitals taken for this visit.    Wt Readings from Last 3 Encounters:  01/25/23 146 lb (66.2 kg)  10/23/21 160 lb (72.6 kg)  08/25/21 160 lb (72.6 kg)     GEN: *** Well nourished, well developed in no acute distress CARDIAC: ***RRR, no murmurs, rubs, gallops RESPIRATORY:  Normal work of breathing MUSCULOSKELETAL: *** edema    ASSESSMENT & PLAN:     *** ***  *** ***  *** ***  *** ***  *** ***    Signed, Maurice Small, MD  03/15/2023 7:43 AM    [facility]

## 2023-03-19 ENCOUNTER — Other Ambulatory Visit: Payer: Self-pay | Admitting: Cardiology

## 2023-03-20 MED ORDER — NITROGLYCERIN 0.4 MG SL SUBL: 0.4000 mg | SUBLINGUAL_TABLET | SUBLINGUAL | 6 refills | Status: AC | PRN

## 2023-03-23 ENCOUNTER — Other Ambulatory Visit: Payer: Self-pay | Admitting: Internal Medicine

## 2023-03-23 NOTE — Telephone Encounter (Signed)
Please advise on refill request   Allergies  Allergen Reactions   Bee Venom Swelling    Swelling at injection site   Other Itching    Reactions to cats and cat dander - itching      Current Outpatient Medications:    aspirin EC 81 MG tablet, Take 1 tablet (81 mg total) by mouth daily. Swallow whole., Disp: , Rfl:    Cholecalciferol (VITAMIN D3) 50 MCG (2000 UT) TABS, Take 2,000 Units by mouth daily. , Disp: , Rfl:    clopidogrel (PLAVIX) 75 MG tablet, Take 1 tablet (75 mg total) by mouth daily., Disp: 90 tablet, Rfl: 3   diphenhydrAMINE (BENADRYL) 25 MG tablet, Take 1 tablet (25 mg total) by mouth every 6 (six) hours as needed for up to 15 doses for itching or allergies., Disp: 15 tablet, Rfl: 0   EPINEPHrine 0.3 mg/0.3 mL IJ SOAJ injection, Inject 0.3 mg into the muscle as needed for anaphylaxis., Disp: 1 each, Rfl: 2   fluticasone (FLONASE) 50 MCG/ACT nasal spray, Place 2 sprays into both nostrils daily as needed for allergies or rhinitis. , Disp: , Rfl:    isosorbide mononitrate (IMDUR) 30 MG 24 hr tablet, Take 1 tablet (30 mg total) by mouth daily., Disp: 90 tablet, Rfl: 3   lisinopril (ZESTRIL) 5 MG tablet, Take 5 mg by mouth daily., Disp: , Rfl:    loratadine (CLARITIN) 10 MG tablet, Take 10 mg by mouth daily as needed for allergies., Disp: , Rfl:    metoprolol succinate (TOPROL XL) 25 MG 24 hr tablet, Take 1 tablet (25 mg total) by mouth daily., Disp: 90 tablet, Rfl: 3   nitroGLYCERIN (NITROSTAT) 0.4 MG SL tablet, Place 1 tablet (0.4 mg total) under the tongue every 5 (five) minutes as needed for chest pain., Disp: 25 tablet, Rfl: 6   rosuvastatin (CRESTOR) 40 MG tablet, Take 1 tablet (40 mg total) by mouth daily. (Patient taking differently: Take 40 mg by mouth at bedtime.), Disp: 90 tablet, Rfl: 3   traZODone (DESYREL) 50 MG tablet, TAKE 1 TABLET BY MOUTH AT BEDTIME AS NEEDED FOR SLEEP, Disp: 30 tablet, Rfl: 5   zaleplon (SONATA) 5 MG capsule, Take 5-10 mg by mouth at bedtime as  needed for sleep. , Disp: , Rfl:

## 2023-03-23 NOTE — Telephone Encounter (Signed)
Trazodone refilled.

## 2023-04-02 ENCOUNTER — Ambulatory Visit: Payer: Medicare HMO | Attending: Internal Medicine | Admitting: Internal Medicine

## 2023-04-02 ENCOUNTER — Encounter: Payer: Self-pay | Admitting: Internal Medicine

## 2023-04-02 ENCOUNTER — Ambulatory Visit: Payer: Medicare HMO

## 2023-04-02 VITALS — BP 122/72 | HR 59 | Ht 68.0 in | Wt 154.0 lb

## 2023-04-02 DIAGNOSIS — I493 Ventricular premature depolarization: Secondary | ICD-10-CM

## 2023-04-02 DIAGNOSIS — R42 Dizziness and giddiness: Secondary | ICD-10-CM | POA: Diagnosis not present

## 2023-04-02 NOTE — Progress Notes (Unsigned)
Enrolled for [employer] to mail a ZIO XT long term holter monitor to the patients address on file. Requested to be mailed 01/23/24.

## 2023-04-02 NOTE — Patient Instructions (Addendum)

## 2023-04-02 NOTE — Progress Notes (Signed)
HPI Alan Wall is referred by Dr. Excell Seltzer for evaluation of PVC's. He is a pleasant 77 yo man with a h/o CAD. He has noted bradycardia on his Apple watch and subsequent eval has demonstrated 17% PVC's. He does not have palpitations. Reviewe of his 12 lead ecg show a right bundle PVC with a fairly narrow QRS. He denies chest pain or sob.  Allergies  Allergen Reactions   Bee Venom Swelling    Swelling at injection site   Other Itching    Reactions to cats and cat dander - itching      Current Outpatient Medications  Medication Sig Dispense Refill   aspirin EC 81 MG tablet Take 1 tablet (81 mg total) by mouth daily. Swallow whole.     Cholecalciferol (VITAMIN D3) 50 MCG (2000 UT) TABS Take 2,000 Units by mouth daily.      clopidogrel (PLAVIX) 75 MG tablet Take 1 tablet (75 mg total) by mouth daily. 90 tablet 3   diphenhydrAMINE (BENADRYL) 25 MG tablet Take 1 tablet (25 mg total) by mouth every 6 (six) hours as needed for up to 15 doses for itching or allergies. 15 tablet 0   EPINEPHrine 0.3 mg/0.3 mL IJ SOAJ injection Inject 0.3 mg into the muscle as needed for anaphylaxis. 1 each 2   fluticasone (FLONASE) 50 MCG/ACT nasal spray Place 2 sprays into both nostrils daily as needed for allergies or rhinitis.      isosorbide mononitrate (IMDUR) 30 MG 24 hr tablet Take 1 tablet (30 mg total) by mouth daily. 90 tablet 3   lisinopril (ZESTRIL) 5 MG tablet Take 5 mg by mouth daily.     loratadine (CLARITIN) 10 MG tablet Take 10 mg by mouth daily as needed for allergies.     metoprolol succinate (TOPROL XL) 25 MG 24 hr tablet Take 1 tablet (25 mg total) by mouth daily. 90 tablet 3   nitroGLYCERIN (NITROSTAT) 0.4 MG SL tablet Place 1 tablet (0.4 mg total) under the tongue every 5 (five) minutes as needed for chest pain. 25 tablet 6   rosuvastatin (CRESTOR) 40 MG tablet Take 1 tablet (40 mg total) by mouth daily. (Patient taking differently: Take 40 mg by mouth at bedtime.) 90 tablet 3   traZODone  (DESYREL) 50 MG tablet TAKE 1 TABLET BY MOUTH AT BEDTIME AS NEEDED FOR SLEEP 30 tablet 5   zaleplon (SONATA) 5 MG capsule Take 5-10 mg by mouth at bedtime as needed for sleep.      No current facility-administered medications for this visit.     Past Medical History:  Diagnosis Date   Allergy    Food/cat dander/bee stings   Coronary artery disease    Hematoma of leg    Hyperlipidemia    Myocardial infarction (HCC)    S/P dilatation of esophageal stricture    Sleep apnea     ROS:   All systems reviewed and negative except as noted in the HPI.   Past Surgical History:  Procedure Laterality Date   CARDIAC CATHETERIZATION     CATARACT EXTRACTION W/ INTRAOCULAR LENS  IMPLANT, BILATERAL  2010   CORONARY/GRAFT ACUTE MI REVASCULARIZATION N/A 12/01/2019   Procedure: Coronary/Graft Acute MI Revascularization;  Surgeon: Tonny Bollman, MD;  Location: [facility] INVASIVE CV LAB;  Service: Cardiovascular;  Laterality: N/A;   INGUINAL HERNIA REPAIR Left    LEFT HEART CATH AND CORONARY ANGIOGRAPHY N/A 12/01/2019   Procedure: LEFT HEART CATH AND CORONARY ANGIOGRAPHY;  Surgeon: Tonny Bollman,  MD;  Location: [facility] INVASIVE CV LAB;  Service: Cardiovascular;  Laterality: N/A;     Family History  Problem Relation Age of Onset   Heart disease Father        CABG at 26 yo   Heart disease Brother    Prostate cancer Paternal Uncle    Coronary artery disease Other      Social History   Socioeconomic History   Marital status: Married    Spouse name: Not on file   Number of children: 0   Years of education: 20   Highest education level: Master's degree (e.g., MA, MS, MEng, MEd, MSW, MBA)  Occupational History    Employer: [employer]  Tobacco Use   Smoking status: Never   Smokeless tobacco: Never  Vaping Use   Vaping status: Never Used  Substance and Sexual Activity   Alcohol use: Yes    Alcohol/week: 2.0 standard drinks of alcohol    Types: 2 Glasses of wine per week    Comment: 2  drinks per week   Drug use: No   Sexual activity: Yes  Other Topics Concern   Not on file  Social History Narrative   Regular Exercise- yes, bicycle   Social Determinants of Health   Financial Resource Strain: Not on file  Food Insecurity: Not on file  Transportation Needs: Not on file  Physical Activity: Not on file  Stress: Not on file  Social Connections: Not on file  Intimate Partner Violence: Not on file     BP 122/72   Pulse (!) 59   Ht 5\' 8"  (1.727 m)   Wt 154 lb (69.9 kg)   SpO2 96%   BMI 23.42 kg/m   Physical Exam:  Well appearing NAD HEENT: Unremarkable Neck:  No JVD, no thyromegally Lymphatics:  No adenopathy Back:  No CVA tenderness Lungs:  Clear with no wheezes HEART:  IRegular rate rhythm, no murmurs, no rubs, no clicks Abd:  soft, positive bowel sounds, no organomegally, no rebound, no guarding Ext:  2 plus pulses, no edema, no cyanosis, no clubbing Skin:  No rashes no nodules Neuro:  CN II through XII intact, motor grossly intact  EKG - reviewed. 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 He is asymptomatic. I have discussed the relatively benign nature of the PVC's. I do not typically worry about the development of PVC CM until over 20-25% PVC's a day. If his today increases or he develops symptoms we would consider adding an AA drug. For now continue low dose beta blocker. CAD - he denies any anginal symptoms. We will follow.  Alan Gowda Skyra Crichlow,MD

## 2023-04-10 ENCOUNTER — Encounter: Payer: Self-pay | Admitting: Cardiovascular Disease

## 2023-11-20 NOTE — Procedures (Signed)
Mask fit

## 2024-02-01 ENCOUNTER — Other Ambulatory Visit: Payer: Self-pay

## 2024-02-01 ENCOUNTER — Emergency Department (HOSPITAL_BASED_OUTPATIENT_CLINIC_OR_DEPARTMENT_OTHER): Admitting: Radiology

## 2024-02-01 ENCOUNTER — Encounter (HOSPITAL_BASED_OUTPATIENT_CLINIC_OR_DEPARTMENT_OTHER): Payer: Self-pay

## 2024-02-01 ENCOUNTER — Emergency Department (HOSPITAL_BASED_OUTPATIENT_CLINIC_OR_DEPARTMENT_OTHER)
Admission: EM | Admit: 2024-02-01 | Discharge: 2024-02-01 | Disposition: A | Discharge status: Home / Self Care | Source: Ambulatory Visit | Attending: Emergency Medicine | Admitting: Emergency Medicine

## 2024-02-01 DIAGNOSIS — R079 Chest pain, unspecified: Secondary | ICD-10-CM

## 2024-02-01 DIAGNOSIS — Z7982 Long term (current) use of aspirin: Secondary | ICD-10-CM | POA: Diagnosis not present

## 2024-02-01 DIAGNOSIS — R0789 Other chest pain: Secondary | ICD-10-CM | POA: Insufficient documentation

## 2024-02-01 LAB — CBC
HCT: 42 % (ref 39.0–52.0)
Hemoglobin: 14 g/dL (ref 13.0–17.0)
MCH: 33.1 pg (ref 26.0–34.0)
MCHC: 33.3 g/dL (ref 30.0–36.0)
MCV: 99.3 fL (ref 80.0–100.0)
Platelets: 184 K/uL (ref 150–400)
RBC: 4.23 MIL/uL (ref 4.22–5.81)
RDW: 12.5 % (ref 11.5–15.5)
WBC: 5 K/uL (ref 4.0–10.5)
nRBC: 0 % (ref 0.0–0.2)

## 2024-02-01 LAB — TROPONIN T, HIGH SENSITIVITY: Troponin T High Sensitivity: <15 ng/L (ref 0–19)

## 2024-02-01 LAB — BASIC METABOLIC PANEL WITH GFR
Anion gap: 12 (ref 5–15)
BUN: 14 mg/dL (ref 8–23)
CO2: 25 mmol/L (ref 22–32)
Calcium: 9.8 mg/dL (ref 8.9–10.3)
Chloride: 104 mmol/L (ref 98–111)
Creatinine, Ser: 1.21 mg/dL (ref 0.61–1.24)
GFR, Estimated: >60 mL/min (ref >60)
Glucose, Bld: 89 mg/dL (ref 70–99)
Potassium: 4.3 mmol/L (ref 3.5–5.1)
Sodium: 141 mmol/L (ref 135–145)

## 2024-02-01 NOTE — ED Provider Notes (Signed)
 Alan Wall   CSN: [id number] Arrival date & time: 02/01/24  1127     Patient presents with: Chest Pain   Alan Wall is a 78 y.o. male.   HPI 78 year old male history of coronary artery disease not amenable to intervention presents today complaining of twitching sensation in left upper anterior chest.  He states this has been coming and going over the past 2 weeks he reports that he read in a pamphlet that if a man had the symptoms they should immediately seek evaluation for heart disease.  Patient is followed by Dr. Wonda with [facility].  He was last seen in the office last October.  His primary care provider is at [facility] and I have reviewed his last Wall from August.  Patient has recently placed his wife in a memory care unit a little over 2 weeks ago.  The symptoms began last week.  He denies any shortness of breath, nausea, vomiting, fever, cough.  He states this is different from when he had his MI that was much more severe.  He describes this pain as lasting a few seconds and feeling like something is jumping on the left side of his chest.    Prior to Admission medications   Medication Sig Start Date End Date Taking? Authorizing Provider  aspirin  EC 81 MG tablet Take 1 tablet (81 mg total) by mouth daily. Swallow whole. 01/25/23   Wonda Sharper, MD  Cholecalciferol (VITAMIN D3) 50 MCG (2000 UT) TABS Take 2,000 Units by mouth daily.     [provider]  clopidogrel  (PLAVIX ) 75 MG tablet Take 1 tablet (75 mg total) by mouth daily. 12/22/19   Lelon Hamilton T, PA-C  diphenhydrAMINE  (BENADRYL ) 25 MG tablet Take 1 tablet (25 mg total) by mouth every 6 (six) hours as needed for up to 15 doses for itching or allergies. 10/23/21   Cottie Donnice PARAS, MD  EPINEPHrine  0.3 mg/0.3 mL IJ SOAJ injection Inject 0.3 mg into the muscle as needed for anaphylaxis. 10/23/21   Cottie Donnice PARAS, MD  fluticasone (FLONASE) 50 MCG/ACT nasal  spray Place 2 sprays into both nostrils daily as needed for allergies or rhinitis.     [provider]  isosorbide  mononitrate (IMDUR ) 30 MG 24 hr tablet Take 1 tablet (30 mg total) by mouth daily. 12/05/19   Duke, Jon Garre, PA  lisinopril  (ZESTRIL ) 5 MG tablet Take 5 mg by mouth daily.    [provider]  loratadine  (CLARITIN ) 10 MG tablet Take 10 mg by mouth daily as needed for allergies.    [provider]  metoprolol  succinate (TOPROL  XL) 25 MG 24 hr tablet Take 1 tablet (25 mg total) by mouth daily. 02/05/23   Wonda Sharper, MD  nitroGLYCERIN  (NITROSTAT ) 0.4 MG SL tablet Place 1 tablet (0.4 mg total) under the tongue every 5 (five) minutes as needed for chest pain. 03/20/23 03/19/24  Wonda Sharper, MD  rosuvastatin  (CRESTOR ) 40 MG tablet Take 1 tablet (40 mg total) by mouth daily. Patient taking differently: Take 40 mg by mouth at bedtime. 12/05/19   Duke, Jon Garre, PA  traZODone  (DESYREL ) 50 MG tablet TAKE 1 TABLET BY MOUTH AT BEDTIME AS NEEDED FOR SLEEP 03/23/23   Neysa Rama D, MD  zaleplon (SONATA) 5 MG capsule Take 5-10 mg by mouth at bedtime as needed for sleep.  09/19/19   [provider]    Allergies: Bee venom and Other    Review  of Systems  Updated Vital Signs BP 118/72   Pulse 61   Temp 98.3 F (36.8 C)   Resp 19   SpO2 98%   Physical Exam Vitals reviewed.  Constitutional:      General: He is not in acute distress.    Appearance: He is well-developed.  HENT:     Head: Normocephalic.  Eyes:     Pupils: Pupils are equal, round, and reactive to light.  Cardiovascular:     Rate and Rhythm: Normal rate and regular rhythm.     Heart sounds: Normal heart sounds.  Pulmonary:     Effort: Pulmonary effort is normal.     Breath sounds: Normal breath sounds.  Abdominal:     General: Bowel sounds are normal.     Palpations: Abdomen is soft.  Musculoskeletal:        General: Normal range of motion.     Cervical back:  Normal range of motion.  Skin:    General: Skin is warm and dry.     Capillary Refill: Capillary refill takes less than 2 seconds.  Neurological:     General: No focal deficit present.     Mental Status: He is alert.     (all labs ordered are listed, but only abnormal results are displayed) Labs Reviewed  BASIC METABOLIC PANEL WITH GFR  CBC  TROPONIN T, HIGH SENSITIVITY  TROPONIN T, HIGH SENSITIVITY    EKG: EKG Interpretation Date/Time:  Friday February 01 2024 12:48:59 EDT Ventricular Rate:  62 PR Interval:  174 QRS Duration:  102 QT Interval:  413 QTC Calculation: 420 R Axis:   44  Text Interpretation: Sinus rhythm Ventricular premature complex Inferior infarct, age indeterminate Lateral leads are also involved No significant change since last tracing of 25 January 2023 Confirmed by Levander Houston [phone]) on 02/01/2024 12:57:26 PM  Radiology: DG Chest 2 View Result Date: 02/01/2024 CLINICAL DATA:  Intermittent chest pain. EXAM: CHEST - 2 VIEW COMPARISON:  February 14, 2020 FINDINGS: The heart size and mediastinal contours are within normal limits. Both lungs are clear. The visualized skeletal structures are unremarkable. IMPRESSION: No active cardiopulmonary disease. Electronically Signed   By: Suzen Dials M.D.   On: 02/01/2024 12:46     .Critical Care  Performed by: Levander Houston, MD Authorized by: Levander Houston, MD   Critical care provider statement:    Critical care time (minutes):  30   Critical care end time:  02/01/2024 1:04 PM   Critical care was necessary to treat or prevent imminent or life-threatening deterioration of the following conditions:  Cardiac failure   Critical care was time spent personally by me on the following activities:  Development of treatment plan with patient or surrogate, discussions with consultants, evaluation of patient's response to treatment, examination of patient, ordering and review of laboratory studies, ordering and review of  radiographic studies, ordering and performing treatments and interventions, pulse oximetry, re-evaluation of patient's condition and review of old charts    Medications Ordered in the ED - No data to display                                  Medical Decision Making Amount and/or Complexity of Data Reviewed Labs: ordered. Radiology: ordered.   78 year old male known history of coronary artery disease presents today with atypical chest pain.  Pain is twitching in nature and lasts seconds.  Patient is evaluated here in  the ED with a EKG.  Initial EKG had baseline wander and was difficult to interpret.  EKG was repeated.  Is in normal sinus rhythm and shows no change from prior with no acute ST changes.  Patient is evaluated here with troponin.  Troponin is normal. Patient was evaluated with CBC and basic metabolic panel which are both normal. Differential diagnosis includes but is not limited to chest wall pain, acute coronary ischemia, other chest wall etiologies, pleuritic type pain, lung infections, pneumothorax, dissection of great vessels, PE. Doubt acute coronary syndrome with symptoms waxing and waning and being very atypical.  Patient's EKG is unchanged from prior and troponin is normal. Doubt PE patient is not dyspneic. No evidence of intrathoracic etiology seen on chest x-Brailyn Killion, specifically no pneumothorax or infection noted. Discussed findings with patient.  Advised regarding return precautions and need for close follow-up.  Patient voices understanding of plan.     Final diagnoses:  Chest pain, unspecified type    ED Discharge Orders     None          Levander Houston, MD 02/01/24 [phone]

## 2024-02-01 NOTE — Discharge Instructions (Addendum)
 You were seen in the emergency department today for chest pain.  Your EKG does not show evidence of having a heart attack at this time.  Your heart enzymes do not show evidence of having a heart attack at this time.  Your other labs are normal. Please call Dr. Margurite office for follow-up as soon as possible.  Please continue your home medications. Return if your symptoms are worsening or changing at any time

## 2024-02-01 NOTE — ED Triage Notes (Signed)
 Patient reports intermittent chest pain that he says it feels like a twitching. He has had a MI in the past. Dr. Ozell Fell is his cardiologist. He says he just wants to make sure it is not another MI. He says this a different pain from his last one.

## 2024-02-06 NOTE — Progress Notes (Signed)
 " Cardiology Office Note   Date:  02/13/2024  ID:  Alan Wall [birth year], MRN [id number] PCP: Alan Devere SQUIBB, MD  [facility] Providers Cardiologist:  Alan Fell, MD     History of Present Illness Alan Wall is a 78 y.o. male with a past medical history of CAD, NSVT, OSA, GERD, dyslipidemia  01/25/2023 monitor average heart rate 66 bpm, predominant rhythm was sinus, 1 run of VT, PVC burden 16.7% >> referred to EP 12/02/2019 echo EF 45 to 50%, mildly decreased function, severe hypokinesis of the LV wall, mild to moderate mitral regurgitation 12/01/2019 cardiac cath right PDA 95% stenosed, first right posterior lateral branch, 100% stenosed  In August 2021 who presented to [facility] with chest pain, EKG demonstrated inferior STEMI he was transferred to [facility], he underwent left heart cath revealing PDA 95% stenosed as well as first posterior lateral branch 100% stenosed >> unsuccessful PCI of the RCA subbranches secondary to residual occlusion of the PLA branch and residual severe stenosis of the PDA branch, vessels not candidates for stenting because of slow overall flow and severe ectasia.  In 2024 he began noticing episodes of bradycardia on his Apple watch, monitor was arranged revealing PVC burden 17% and he was referred to EP who suggested continue with current low-dose beta-blocker since he was overall asymptomatic.  Most recently evaluated in the emergency department on 02/01/2024 for chest pain, he was dealing with a high level of stress with recently placing his wife in a facility for memory care, troponins were normal, EKG was unchanged, and he was subsequently discharged.  Alan Wall presents today for follow-up after recent ED visit as outlined above.  He has had a tremendously stressful past couple of months, in the process of downsizing his home he has also had to place his wife in a memory care unit-session in the last 3 weeks  since she has been in the memory care unit she has been back and forth to the hospital on 3 separate occasions, while he was moving boxes he began experiencing a sensation in his left upper chest, went to the ED, EKG was unchanged troponins were negative and he was given a 3-day monitor to assess for palpitation burden.  He is a retired psychologist, occupational, keeps meticulous records of his blood pressure, weight, symptoms we reviewed them, his blood pressure is very well-controlled.  He remains very physically active, doing rather intense yoga exercise.  He does follow closely with his PCP. He denies chest pain, palpitations, dyspnea, pnd, orthopnea, n, v, dizziness, syncope, edema, weight gain, or early satiety.    ROS: Review of Systems  Cardiovascular:  Positive for palpitations.  Psychiatric/Behavioral:         High level of personal stress  All other systems reviewed and are negative.    Studies Reviewed      Cardiac Studies & Procedures   ______________________________________________________________________________________________ CARDIAC CATHETERIZATION  CARDIAC CATHETERIZATION 12/01/2019  Conclusion  Balloon angioplasty was performed using a BALLOON SAPPHIRE 2.0X15.  Post intervention, there is a 100% residual stenosis.  Balloon angioplasty was performed.  Post intervention, there is a 80% residual stenosis.  1.  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  Patency of the left main 4.  Patency of the LAD with mild nonobstructive disease  5.  Normal LVEDP 6.  Unsuccessful PCI of the RCA subbranches secondary to residual occlusion of the PLA branch and residual severe stenosis of the PDA branch, vessels not candidates for stenting because of slow overall flow and severe ectasia  Recommend: aggrastat  x 18 hours,  aggressive risk reduction measures, ASA/ticagrelor  x 12 months (ACS Class 1 recommendation) consider long-term if tolerated with marked coronary ectasia  Findings Coronary Findings Diagnostic  Dominance: Right  Left Anterior Descending Mid LAD lesion is 40% stenosed.  Left Circumflex The vessel is ectatic. Proximal vessel ectasia with associated filling defect  Right Coronary Artery The vessel is ectatic. There is severe diffuse ectasia of the RCA  Right Posterior Descending Artery RPDA lesion is 95% stenosed.  First Right Posterolateral Branch 1st RPL lesion is 100% stenosed. The lesion is thrombotic.  Intervention  RPDA lesion Angioplasty Balloon angioplasty was performed. Post-Intervention Lesion Assessment The intervention was unsuccessful. Pre-interventional TIMI flow is 3. Post-intervention TIMI flow is 3. No complications occurred at this lesion. There is a 80% residual stenosis post intervention.  1st RPL lesion Angioplasty Balloon angioplasty was performed using a BALLOON SAPPHIRE 2.0X15. Post-Intervention Lesion Assessment The intervention was unsuccessful. Pre-interventional TIMI flow is 0. Post-intervention TIMI flow is 0. No complications occurred at this lesion. There is a 100% residual stenosis post intervention.     ECHOCARDIOGRAM  ECHOCARDIOGRAM COMPLETE 12/02/2019  Narrative ECHOCARDIOGRAM REPORT    Patient Name:   Alan Wall Date of Exam: 12/02/2019 Medical Rec #:  [id number]     Height:       67.0 in Accession #:    [id number]    Weight:       165.0 lb Date of Birth:  1945/08/10    BSA:          1.863 m Patient Age:    73 years      BP:           138/91 mmHg Patient Gender: M             HR:           54 bpm. Exam Location:  Inpatient  Procedure: 2D Echo  Indications:    chest pain 786.50  History:        Patient has prior history of Echocardiogram examinations, most recent 11/17/2013. Acute MI; Risk  Factors:Dyslipidemia.  Sonographer:    Elinor Fresh RDCS (AE) Referring Phys: [id number] MICHAEL COOPER  IMPRESSIONS   1. Left ventricular ejection fraction, by estimation, is 45 to 50%. The left ventricle has mildly decreased function. The left ventricle has no regional wall motion abnormalities. Left ventricular diastolic parameters were normal. There is severe hypokinesis of the left ventricular, basal inferior wall, inferoseptal wall and inferolateral wall. 2. Right ventricular systolic function is normal. The right ventricular size is normal. There is normal pulmonary artery systolic pressure. The estimated right ventricular systolic pressure is 29.8 mmHg. 3. The mitral valve is normal in structure. Mild to moderate mitral valve regurgitation. No evidence of mitral stenosis. 4. The aortic valve is normal in structure. Aortic valve regurgitation is not visualized. No aortic stenosis is present. 5. The inferior vena cava is normal in size with greater than 50% respiratory variability, suggesting right atrial pressure of 3 mmHg.  FINDINGS Left Ventricle: Left ventricular ejection fraction, by estimation, is 45 to 50%. The left ventricle has mildly decreased function. The left ventricle has no regional wall motion abnormalities. Severe hypokinesis of the left ventricular, basal inferior wall, inferoseptal wall  and inferolateral wall. The left ventricular internal cavity size was normal in size. There is no left ventricular hypertrophy. Left ventricular diastolic parameters were normal. Normal left ventricular filling pressure.  Right Ventricle: The right ventricular size is normal. No increase in right ventricular wall thickness. Right ventricular systolic function is normal. There is normal pulmonary artery systolic pressure. The tricuspid regurgitant velocity is 2.59 m/s, and with an assumed right atrial pressure of 3 mmHg, the estimated right ventricular systolic pressure is 29.8 mmHg.  Left  Atrium: Left atrial size was normal in size.  Right Atrium: Right atrial size was normal in size.  Pericardium: There is no evidence of pericardial effusion.  Mitral Valve: The mitral valve is normal in structure. Normal mobility of the mitral valve leaflets. Mild to moderate mitral valve regurgitation. No evidence of mitral valve stenosis.  Tricuspid Valve: The tricuspid valve is normal in structure. Tricuspid valve regurgitation is mild . No evidence of tricuspid stenosis.  Aortic Valve: The aortic valve is normal in structure. Aortic valve regurgitation is not visualized. No aortic stenosis is present.  Pulmonic Valve: The pulmonic valve was normal in structure. Pulmonic valve regurgitation is not visualized. No evidence of pulmonic stenosis.  Aorta: The aortic root is normal in size and structure.  Venous: The inferior vena cava is normal in size with greater than 50% respiratory variability, suggesting right atrial pressure of 3 mmHg.  IAS/Shunts: No atrial level shunt detected by color flow Doppler.   LEFT VENTRICLE PLAX 2D LVIDd:         4.00 cm  Diastology LVIDs:         3.00 cm  LV e' lateral:   11.60 cm/s LV PW:         1.10 cm  LV E/e' lateral: 5.1 LV IVS:        1.10 cm  LV e' medial:    7.29 cm/s LVOT diam:     2.00 cm  LV E/e' medial:  8.2 LV SV:         83 LV SV Index:   45 LVOT Area:     3.14 cm   RIGHT VENTRICLE RV S prime:     10.30 cm/s TAPSE (M-mode): 2.2 cm  LEFT ATRIUM           Index LA diam:      3.50 cm 1.88 cm/m LA Vol (A2C): 37.8 ml 20.28 ml/m AORTIC VALVE LVOT Vmax:   118.00 cm/s LVOT Vmean:  80.400 cm/s LVOT VTI:    0.265 m  AORTA Ao Root diam: 3.40 cm  MITRAL VALVE               TRICUSPID VALVE MV Area (PHT): 2.99 cm    TR Peak grad:   26.8 mmHg MV Decel Time: 254 msec    TR Vmax:        259.00 cm/s MV E velocity: 59.60 cm/s MV A velocity: 54.00 cm/s  SHUNTS MV E/A ratio:  1.10        Systemic VTI:  0.26 m Systemic Diam: 2.00  cm  Mihai Croitoru MD Electronically signed by Jerel Balding MD Signature Date/Time: 12/02/2019/8:52:45 AM    Final    MONITORS  LONG TERM MONITOR (3-14 DAYS) 02/02/2023  Narrative Patch Wear Time:  3 days and 6 hours (2024-10-03T10:24:08-398 to 2024-10-06T16:52:33-0400)  Patient had a min HR of 44 bpm, max HR of 162 bpm, and avg HR of 66 bpm. Predominant underlying rhythm was Sinus Rhythm. 1 run  of Ventricular Tachycardia occurred lasting 4 beats with a max rate of 162 bpm (avg 154 bpm). 3 Supraventricular Tachycardia runs occurred, the run with the fastest interval lasting 5 beats with a max rate of 135 bpm, the longest lasting 11 beats with an avg rate of 123 bpm. Some episodes of Supraventricular Tachycardia may be possible Atrial Tachycardia with variable block. Isolated SVEs were rare (<1.0%), SVE Couplets were rare (<1.0%), and no SVE Triplets were present. Isolated VEs were frequent (16.7%, 50580), VE Couplets were rare (<1.0%, 1186), and VE Triplets were rare (<1.0%, 91). Ventricular Bigeminy and Trigeminy were present.  SUMMARY: The basic rhythm is normal sinus with an average HR of 66 bpm No atrial fibrillation or flutter No high-grade heart block or pathologic pauses There are frequent PVC's and rare supraventricular beats. High PVC burden of 17%. Otherwise as above.       ______________________________________________________________________________________________      Risk Assessment/Calculations           Physical Exam VS:  BP 106/72   Pulse (!) 57   Ht 5' 7 (1.702 m)   Wt 149 lb (67.6 kg)   SpO2 94%   BMI 23.34 kg/m        Wt Readings from Last 3 Encounters:  02/13/24 149 lb (67.6 kg)  04/02/23 154 lb (69.9 kg)  01/25/23 146 lb (66.2 kg)    GEN: Fit, appears younger than stated age, well nourished, well developed in no acute distress NECK: No JVD; No carotid bruits CARDIAC: RRR, no murmurs, rubs, gallops RESPIRATORY:  Clear to auscultation  without rales, wheezing or rhonchi  ABDOMEN: Soft, non-tender, non-distended EXTREMITIES:  No edema; No deformity   ASSESSMENT AND PLAN CAD - cardiac cath in 2021 revealed right PDA 95% stenosed, first right posterior lateral branch, 100% stenosed.  Continue aspirin  81 mg daily, Plavix  75 mg daily, Imdur  30 mg daily, metoprolol  25 mg daily, nitroglycerin  as needed, Crestor  40 mg daily.  I believe the episode as outlined above in the HPI was a result of tremendous stress he is under with a transition of his wife to a memory care unit and also moving to a new house.  Stable with no anginal symptoms. No indication for ischemic evaluation. He maintains a healthy lifestyle and is physically active.  He does have a strong family history of early coronary artery disease.  Will check fasting lipid panel, LP(a) as well as APO B today.  PVCs -currently wearing a monitor to assess for PVC burden, previously evaluated by Dr. Waddell as his PVC burden was 17% however asymptomatic and the decision was made to continue with metoprolol  25 mg daily.  Dyslipidemia-currently on Crestor  40 mg daily, will check LFTs, fasting lipid panel, LP(a), ApoB.        Dispo: LFTs, fasting lipid panel, LP(a), ApoB, follow-up in 1 year.  Signed, Delon JAYSON Hoover, NP  "

## 2024-02-13 ENCOUNTER — Ambulatory Visit: Attending: Cardiology | Admitting: Cardiology

## 2024-02-13 ENCOUNTER — Encounter: Payer: Self-pay | Admitting: Cardiology

## 2024-02-13 ENCOUNTER — Other Ambulatory Visit: Payer: Self-pay | Admitting: Cardiovascular Disease

## 2024-02-13 VITALS — BP 106/72 | HR 57 | Ht 67.0 in | Wt 149.0 lb

## 2024-02-13 DIAGNOSIS — I25810 Atherosclerosis of coronary artery bypass graft(s) without angina pectoris: Secondary | ICD-10-CM | POA: Diagnosis not present

## 2024-02-13 DIAGNOSIS — I493 Ventricular premature depolarization: Secondary | ICD-10-CM

## 2024-02-13 DIAGNOSIS — E782 Mixed hyperlipidemia: Secondary | ICD-10-CM

## 2024-02-13 NOTE — Patient Instructions (Signed)
 Medication Instructions:  Your physician recommends that you continue on your current medications as directed. Please refer to the Current Medication list given to you today.  *If you need a refill on your cardiac medications before your next appointment, please call your pharmacy*  Lab Work: LFTs, Fasting lipid panel, Lpa, Apo B today  Testing/Procedures: NONE ordered at this time of appointment   Follow-Up: At [facility], you and your health needs are our priority.  As part of our continuing mission to provide you with exceptional heart care, our providers are all part of one team.  This team includes your primary Cardiologist (physician) and Advanced Practice Providers or APPs (Physician Assistants and Nurse Practitioners) who all work together to provide you with the care you need, when you need it.  Your next appointment:   1 year(s)  Provider:   Ozell Fell, MD    We recommend signing up for the patient portal called MyChart.  Sign up information is provided on this After Visit Summary.  MyChart is used to connect with patients for Virtual Visits (Telemedicine).  Patients are able to view lab/test results, encounter notes, upcoming appointments, etc.  Non-urgent messages can be sent to your provider as well.   To learn more about what you can do with MyChart, go to [email or web address].

## 2024-02-14 LAB — LIPID PANEL
Chol/HDL Ratio: 2.2 ratio (ref 0.0–5.0)
Cholesterol, Total: 160 mg/dL (ref 100–199)
HDL: 74 mg/dL
LDL Chol Calc (NIH): 75 mg/dL (ref 0–99)
Triglycerides: 54 mg/dL (ref 0–149)
VLDL Cholesterol Cal: 11 mg/dL (ref 5–40)

## 2024-02-14 LAB — HEPATIC FUNCTION PANEL
ALT: 13 IU/L (ref 0–44)
AST: 23 IU/L (ref 0–40)
Albumin: 4.3 g/dL (ref 3.8–4.8)
Alkaline Phosphatase: 62 IU/L (ref 47–123)
Bilirubin Total: 0.3 mg/dL (ref 0.0–1.2)
Bilirubin, Direct: 0.13 mg/dL (ref 0.00–0.40)
Total Protein: 6.4 g/dL (ref 6.0–8.5)

## 2024-02-14 LAB — LIPOPROTEIN A (LPA): Lipoprotein (a): 121.6 nmol/L — ABNORMAL HIGH

## 2024-02-14 LAB — APOLIPOPROTEIN B: Apolipoprotein B: 61 mg/dL

## 2024-02-15 ENCOUNTER — Ambulatory Visit: Payer: Self-pay | Admitting: Cardiology

## 2024-02-15 DIAGNOSIS — I25810 Atherosclerosis of coronary artery bypass graft(s) without angina pectoris: Secondary | ICD-10-CM

## 2024-02-15 DIAGNOSIS — E782 Mixed hyperlipidemia: Secondary | ICD-10-CM

## 2024-02-18 MED ORDER — EZETIMIBE 10 MG PO TABS: 10.0000 mg | ORAL_TABLET | Freq: Every day | ORAL | 3 refills | Status: AC

## 2024-03-16 DIAGNOSIS — I493 Ventricular premature depolarization: Secondary | ICD-10-CM

## 2024-03-16 DIAGNOSIS — R42 Dizziness and giddiness: Secondary | ICD-10-CM

## 2024-03-17 ENCOUNTER — Ambulatory Visit: Payer: Self-pay | Admitting: Internal Medicine

## 2024-04-21 ENCOUNTER — Other Ambulatory Visit: Payer: Self-pay

## 2024-04-22 MED ORDER — ROSUVASTATIN CALCIUM 40 MG PO TABS: 40.0000 mg | ORAL_TABLET | Freq: Every day | ORAL | 3 refills | Status: AC

## 2024-04-23 ENCOUNTER — Other Ambulatory Visit: Payer: Self-pay

## 2024-04-28 MED ORDER — LISINOPRIL 5 MG PO TABS: 5.0000 mg | ORAL_TABLET | Freq: Every day | ORAL | 3 refills | Status: AC
